# Patient Record
Sex: Female | Born: 1945 | Race: Black or African American | Hispanic: No | Marital: Single | Smoking: Never smoker
Health system: Southern US, Community
[De-identification: ages and names within clinical notes are randomized; demographics above are authoritative.]

## PROBLEM LIST (undated history)

## (undated) DIAGNOSIS — R030 Elevated blood-pressure reading, without diagnosis of hypertension: Secondary | ICD-10-CM

## (undated) DIAGNOSIS — R51 Headache: Secondary | ICD-10-CM

## (undated) DIAGNOSIS — Z8619 Personal history of other infectious and parasitic diseases: Secondary | ICD-10-CM

## (undated) DIAGNOSIS — R519 Headache, unspecified: Secondary | ICD-10-CM

## (undated) DIAGNOSIS — M1990 Unspecified osteoarthritis, unspecified site: Secondary | ICD-10-CM

## (undated) HISTORY — DX: Elevated blood-pressure reading, without diagnosis of hypertension: R03.0

## (undated) HISTORY — DX: Unspecified osteoarthritis, unspecified site: M19.90

## (undated) HISTORY — DX: Personal history of other infectious and parasitic diseases: Z86.19

## (undated) HISTORY — PX: KNEE SURGERY: SHX244

## (undated) HISTORY — DX: Headache: R51

## (undated) HISTORY — PX: OTHER SURGICAL HISTORY: SHX169

## (undated) HISTORY — PX: CATARACT EXTRACTION: SUR2

## (undated) HISTORY — DX: Headache, unspecified: R51.9

---

## 1993-10-14 HISTORY — PX: BREAST BIOPSY: SHX20

## 1993-10-14 HISTORY — PX: ABDOMINAL HYSTERECTOMY: SHX81

## 2014-10-28 ENCOUNTER — Ambulatory Visit (HOSPITAL_BASED_OUTPATIENT_CLINIC_OR_DEPARTMENT_OTHER)
Admission: RE | Admit: 2014-10-28 | Discharge: 2014-10-28 | Disposition: A | Discharge status: Home / Self Care | Payer: BLUE CROSS/BLUE SHIELD | Source: Ambulatory Visit | Attending: Physician Assistant | Admitting: Physician Assistant

## 2014-10-28 ENCOUNTER — Encounter: Payer: Self-pay | Admitting: Physician Assistant

## 2014-10-28 ENCOUNTER — Ambulatory Visit (INDEPENDENT_AMBULATORY_CARE_PROVIDER_SITE_OTHER): Payer: BLUE CROSS/BLUE SHIELD | Admitting: Physician Assistant

## 2014-10-28 VITALS — BP 138/74 | HR 68 | Temp 98.3°F | Resp 16 | Ht 63.25 in | Wt 161.4 lb

## 2014-10-28 DIAGNOSIS — M170 Bilateral primary osteoarthritis of knee: Secondary | ICD-10-CM | POA: Insufficient documentation

## 2014-10-28 DIAGNOSIS — Z23 Encounter for immunization: Secondary | ICD-10-CM

## 2014-10-28 DIAGNOSIS — M25561 Pain in right knee: Secondary | ICD-10-CM | POA: Diagnosis present

## 2014-10-28 DIAGNOSIS — Z0184 Encounter for antibody response examination: Secondary | ICD-10-CM

## 2014-10-28 DIAGNOSIS — Z0000 Encounter for general adult medical examination without abnormal findings: Secondary | ICD-10-CM

## 2014-10-28 DIAGNOSIS — Z13820 Encounter for screening for osteoporosis: Secondary | ICD-10-CM | POA: Insufficient documentation

## 2014-10-28 DIAGNOSIS — Z1211 Encounter for screening for malignant neoplasm of colon: Secondary | ICD-10-CM

## 2014-10-28 LAB — COMPREHENSIVE METABOLIC PANEL
ALT: 10 U/L (ref 0–35)
AST: 21 U/L (ref 0–37)
Albumin: 4.4 g/dL (ref 3.5–5.2)
Alkaline Phosphatase: 89 U/L (ref 39–117)
BUN: 8 mg/dL (ref 6–23)
CO2: 28 mEq/L (ref 19–32)
Calcium: 9.7 mg/dL (ref 8.4–10.5)
Chloride: 107 mEq/L (ref 96–112)
Creatinine, Ser: 0.57 mg/dL (ref 0.40–1.20)
GFR: 136.25 mL/min (ref >60.00)
Glucose, Bld: 87 mg/dL (ref 70–99)
Potassium: 3.7 mEq/L (ref 3.5–5.1)
Sodium: 140 mEq/L (ref 135–145)
Total Bilirubin: 0.4 mg/dL (ref 0.2–1.2)
Total Protein: 8.2 g/dL (ref 6.0–8.3)

## 2014-10-28 LAB — CBC
HCT: 40.7 % (ref 36.0–46.0)
Hemoglobin: 13.1 g/dL (ref 12.0–15.0)
MCHC: 32.1 g/dL (ref 30.0–36.0)
MCV: 89.5 fl (ref 78.0–100.0)
Platelets: 348 10*3/uL (ref 150.0–400.0)
RBC: 4.55 Mil/uL (ref 3.87–5.11)
RDW: 12.4 % (ref 11.5–15.5)
WBC: 8.6 10*3/uL (ref 4.0–10.5)

## 2014-10-28 LAB — LIPID PANEL
Cholesterol: 184 mg/dL (ref 0–200)
HDL: 54.1 mg/dL (ref >39.00)
LDL Cholesterol: 118 mg/dL — ABNORMAL HIGH (ref 0–99)
NonHDL: 129.9
Total CHOL/HDL Ratio: 3
Triglycerides: 58 mg/dL (ref 0.0–149.0)
VLDL: 11.6 mg/dL (ref 0.0–40.0)

## 2014-10-28 LAB — HEMOGLOBIN A1C: Hgb A1c MFr Bld: 5.9 % (ref 4.6–6.5)

## 2014-10-28 LAB — TSH: TSH: 1.62 u[IU]/mL (ref 0.35–4.50)

## 2014-10-28 MED ORDER — TRAMADOL HCL 50 MG PO TABS: 50.0000 mg | ORAL_TABLET | Freq: Three times a day (TID) | ORAL | Status: DC | PRN

## 2014-10-28 NOTE — Progress Notes (Signed)
Patient presents to clinic today to establish care.  Patient requesting CPE.  Is fasting for labs.  Patient with history of OA of knees bilaterally.  Endorses pain daily with some mild stiffness lasting < 1 hour.  Pain is worse at end of day.  Takes occasional Advil for pain with little relief.  Has not had imaging since 2008 when she had L knee arthroscopy.  Overdue for Bone Density to screen for osteoporosis and Colonoscopy to screen for colorectal cancer.  Past Medical History  Diagnosis Date  . Elevated blood pressure (not hypertension)   . 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 Frequent headaches     No current outpatient prescriptions on file prior to visit.   No current facility-administered medications on file prior to visit.    No Known Allergies  Family History  Problem Relation Age of Onset  . Breast cancer Mother 37    Deceased  . Heart disease Mother   . Hypertension Mother   . Hypertension Father     Deceased  . Stroke Father   . Hypertension Sister   . Hypertension Brother   . Heart defect Daughter     History   Social History  . Marital Status: Single    Spouse Name: N/A    Number of Children: N/A  . Years of Education: N/A   Social History Main Topics  . Smoking status: Never Smoker   . Smokeless tobacco: Never Used  . Alcohol Use: 0.0 oz/week    0 Not specified per week     Comment: rare  . Drug Use: No  . Sexual Activity: Not Currently   Other Topics Concern  . 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   Review of Systems - See HPI.  All other ROS are negative.  BP 138/74 mmHg  Pulse 68  Temp(Src) 98.3 F (36.8 C) (Oral)  Resp 16  Ht 5' 3.25" (1.607 m)  Wt 161 lb 6 oz (73.199 kg)  BMI 28.34 kg/m2  SpO2 100%  Physical Exam  Constitutional: She is oriented to person, place, and time and well-developed, well-nourished, and in no distress.  HENT:  Head:  Normocephalic and atraumatic.  Right Ear: External ear normal.  Left Ear: External ear normal.  Nose: Nose normal.  Mouth/Throat: Oropharynx is clear and moist. No oropharyngeal exudate.  TM within normal limits bilaterally.  Eyes: Conjunctivae are normal.  Neck: Neck supple. No thyromegaly present.  Cardiovascular: Normal rate, regular rhythm, normal heart sounds and intact distal pulses.   Pulmonary/Chest: Effort normal and breath sounds normal. No respiratory distress. She has no wheezes. She has no rales. She exhibits no tenderness.  Abdominal: Soft. Bowel sounds are normal. She exhibits no distension and no mass. There is no tenderness. There is no rebound and no guarding.  Musculoskeletal:       Right knee: She exhibits decreased range of motion. She exhibits no swelling. Tenderness found.       Left knee: She exhibits decreased range of motion. She exhibits no swelling. Tenderness found.  Lymphadenopathy:    She has no cervical adenopathy.  Neurological: She is alert and oriented to person, place, and time.  Skin: Skin is warm and dry. No rash noted.  Psychiatric: Affect normal.  Vitals reviewed.  Assessment/Plan: Visit for preventive health examination I have reviewed the patient's medical history in detail and updated the computerized patient  record. Prevnar given by nursing staff. Order placed for colonoscopy and bone density scan.  Endorses mammogram up-to-date.  Will obtain fasting labs today.  Preventive care reviewed.  Handout given.   Immunity status testing Will check Varicella immunity status before proceeding with Zostavax.   Primary osteoarthritis of both knees Will obtain DG knees bilaterally to assess severity of OA. Rx Tramadol for severe pain.  Apply topical Aspercreme to knees each morning.  Wear supportive footwear.  Follow-up 1 month.   Colon cancer screening Referral placed to GI for screening colonoscopy in an average-risk patient.   Osteoporosis  screening Order for Bone Density placed.  Resistance training reviewed with patient.  Also recommended Calc-Vit D supplementation.  Patient will give some thought.

## 2014-10-28 NOTE — Progress Notes (Signed)
Pre visit review using our clinic review tool, if applicable. No additional management support is needed unless otherwise documented below in the visit note/SLS  

## 2014-10-28 NOTE — Patient Instructions (Signed)
Please go to the lab for blood work.  I will call you with your results.  Go downstairs for your x-ray.  I will call you with these results as well. 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  Use Tramadol sparingly if needed for severe pain.  Apply topical Aspercreme to the knees each day before working.  You will be contacted by GI for a colonoscopy and by our Imaging department for a Osteoporosis screening.   Welcome to [facility]!  Follow-up in 1 month.  Preventive Care for Adults A healthy lifestyle and preventive care can promote health and wellness. Preventive health guidelines for women include the following key practices.  A routine yearly physical is a good way to check with your health care provider about your health and preventive screening. It is a chance to share any concerns and updates on your health and to receive a thorough exam.  Visit your dentist for a routine exam and preventive care every 6 months. Brush your teeth twice a day and floss once a day. Good oral hygiene prevents tooth decay and gum disease.  The frequency of eye exams is based on your age, health, family medical history, use of contact lenses, and other factors. Follow your health care provider's recommendations for frequency of eye exams.  Eat a healthy diet. Foods like vegetables, fruits, whole grains, low-fat dairy products, and lean protein foods contain the nutrients you need without too many calories. Decrease your intake of foods high in solid fats, added sugars, and salt. Eat the right amount of calories for you.Get information about a proper diet from your health care provider, if necessary.  Regular physical exercise is one of the most important things you can do for your health. Most adults should get at least 150 minutes of moderate-intensity exercise (any activity that increases your heart rate and causes you to sweat) each week. In addition, most adults need muscle-strengthening exercises on 2 or  more days a week.  Maintain a healthy weight. The body mass index (BMI) is a screening tool to identify possible weight problems. It provides an estimate of body fat based on height and weight. Your health care provider can find your BMI and can help you achieve or maintain a healthy weight.For adults 20 years and older:  A BMI below 18.5 is considered underweight.  A BMI of 18.5 to 24.9 is normal.  A BMI of 25 to 29.9 is considered overweight.  A BMI of 30 and above is considered obese.  Maintain normal blood lipids and cholesterol levels by exercising and minimizing your intake of saturated fat. Eat a balanced diet with plenty of fruit and vegetables. Blood tests for lipids and cholesterol should begin at age 7 and be repeated every 5 years. If your lipid or cholesterol levels are high, you are over 50, or you are at high risk for heart disease, you may need your cholesterol levels checked more frequently.Ongoing high lipid and cholesterol levels should be treated with medicines if diet and exercise are not working.  If you smoke, find out from your health care provider how to quit. If you do not use tobacco, do not start.  Lung cancer screening is recommended for adults aged 41-80 years who are at high risk for developing lung cancer because of a history of smoking. A yearly low-dose CT scan of the lungs is recommended for people who have at least a 30-pack-year history of smoking and are a current smoker or have quit within  the past 15 years. A pack year of smoking is smoking an average of 1 pack of cigarettes a day for 1 year (for example: 1 pack a day for 30 years or 2 packs a day for 15 years). Yearly screening should continue until the smoker has stopped smoking for at least 15 years. Yearly screening should be stopped for people who develop a health problem that would prevent them from having lung cancer treatment.  If you are pregnant, do not drink alcohol. If you are breastfeeding, be  very cautious about drinking alcohol. If you are not pregnant and choose to drink alcohol, do not have more than 1 drink per day. One drink is considered to be 12 ounces (355 mL) of beer, 5 ounces (148 mL) of wine, or 1.5 ounces (44 mL) of liquor.  Avoid use of street drugs. Do not share needles with anyone. Ask for help if you need support or instructions about stopping the use of drugs.  High blood pressure causes heart disease and increases the risk of stroke. Your blood pressure should be checked at least every 1 to 2 years. Ongoing high blood pressure should be treated with medicines if weight loss and exercise do not work.  If you are 16-59 years old, ask your health care provider if you should take aspirin to prevent strokes.  Diabetes screening involves taking a blood sample to check your fasting blood sugar level. This should be done once every 3 years, after age 68, if you are within normal weight and without risk factors for diabetes. Testing should be considered at a younger age or be carried out more frequently if you are overweight and have at least 1 risk factor for diabetes.  Breast cancer screening is essential preventive care for women. You should practice "breast self-awareness." This means understanding the normal appearance and feel of your breasts and may include breast self-examination. Any changes detected, no matter how small, should be reported to a health care provider. Women in their 24s and 30s should have a clinical breast exam (CBE) by a health care provider as part of a regular health exam every 1 to 3 years. After age 14, women should have a CBE every year. Starting at age 59, women should consider having a mammogram (breast X-ray test) every year. Women who have a family history of breast cancer should talk to their health care provider about genetic screening. Women at a high risk of breast cancer should talk to their health care providers about having an MRI and a  mammogram every year.  Breast cancer gene (BRCA)-related cancer risk assessment is recommended for women who have family members with BRCA-related cancers. BRCA-related cancers include breast, ovarian, tubal, and peritoneal cancers. Having family members with these cancers may be associated with an increased risk for harmful changes (mutations) in the breast cancer genes BRCA1 and BRCA2. Results of the assessment will determine the need for genetic counseling and BRCA1 and BRCA2 testing.  Routine pelvic exams to screen for cancer are no longer recommended for nonpregnant women who are considered low risk for cancer of the pelvic organs (ovaries, uterus, and vagina) and who do not have symptoms. Ask your health care provider if a screening pelvic exam is right for you.  If you have had past treatment for cervical cancer or a condition that could lead to cancer, you need Pap tests and screening for cancer for at least 20 years after your treatment. If Pap tests have been discontinued, your  risk factors (such as having a new sexual partner) need to be reassessed to determine if screening should be resumed. Some women have medical problems that increase the chance of getting cervical cancer. In these cases, your health care provider may recommend more frequent screening and Pap tests.  The HPV test is an additional test that may be used for cervical cancer screening. The HPV test looks for the virus that can cause the cell changes on the cervix. The cells collected during the Pap test can be tested for HPV. The HPV test could be used to screen women aged 72 years and older, and should be used in women of any age who have unclear Pap test results. After the age of 6, women should have HPV testing at the same frequency as a Pap test.  Colorectal cancer can be detected and often prevented. Most routine colorectal cancer screening begins at the age of 55 years and continues through age 31 years. However, your  health care provider may recommend screening at an earlier age if you have risk factors for colon cancer. On a yearly basis, your health care provider may provide home test kits to check for hidden blood in the stool. Use of a small camera at the end of a tube, to directly examine the colon (sigmoidoscopy or colonoscopy), can detect the earliest forms of colorectal cancer. Talk to your health care provider about this at age 40, when routine screening begins. Direct exam of the colon should be repeated every 5-10 years through age 33 years, unless early forms of pre-cancerous polyps or small growths are found.  People who are at an increased risk for hepatitis B should be screened for this virus. You are considered at high risk for hepatitis B if:  You were born in a country where hepatitis B occurs often. Talk with your health care provider about which countries are considered high risk.  Your parents were born in a high-risk country and you have not received a shot to protect against hepatitis B (hepatitis B vaccine).  You have HIV or AIDS.  You use needles to inject street drugs.  You live with, or have sex with, someone who has hepatitis B.  You get hemodialysis treatment.  You take certain medicines for conditions like cancer, organ transplantation, and autoimmune conditions.  Hepatitis C blood testing is recommended for all people born from [birth year] through 1965 and any individual with known risks for hepatitis C.  Practice safe sex. Use condoms and avoid high-risk sexual practices to reduce the spread of sexually transmitted infections (STIs). STIs include gonorrhea, chlamydia, syphilis, trichomonas, herpes, HPV, and human immunodeficiency virus (HIV). Herpes, HIV, and HPV are viral illnesses that have no cure. They can result in disability, cancer, and death.  You should be screened for sexually transmitted illnesses (STIs) including gonorrhea and chlamydia if:  You are sexually active  and are younger than 24 years.  You are older than 24 years and your health care provider tells you that you are at risk for this type of infection.  Your sexual activity has changed since you were last screened and you are at an increased risk for chlamydia or gonorrhea. Ask your health care provider if you are at risk.  If you are at risk of being infected with HIV, it is recommended that you take a prescription medicine daily to prevent HIV infection. This is called preexposure prophylaxis (PrEP). You are considered at risk if:  You are a heterosexual woman,  are sexually active, and are at increased risk for HIV infection.  You take drugs by injection.  You are sexually active with a partner who has HIV.  Talk with your health care provider about whether you are at high risk of being infected with HIV. If you choose to begin PrEP, you should first be tested for HIV. You should then be tested every 3 months for as long as you are taking PrEP.  Osteoporosis is a disease in which the bones lose minerals and strength with aging. This can result in serious bone fractures or breaks. The risk of osteoporosis can be identified using a bone density scan. Women ages 84 years and over and women at risk for fractures or osteoporosis should discuss screening with their health care providers. Ask your health care provider whether you should take a calcium supplement or vitamin D to reduce the rate of osteoporosis.  Menopause can be associated with physical symptoms and risks. Hormone replacement therapy is available to decrease symptoms and risks. You should talk to your health care provider about whether hormone replacement therapy is right for you.  Use sunscreen. Apply sunscreen liberally and repeatedly throughout the day. You should seek shade when your shadow is shorter than you. Protect yourself by wearing long sleeves, pants, a wide-brimmed hat, and sunglasses year round, whenever you are  outdoors.  Once a month, do a whole body skin exam, using a mirror to look at the skin on your back. Tell your health care provider of new moles, moles that have irregular borders, moles that are larger than a pencil eraser, or moles that have changed in shape or color.  Stay current with required vaccines (immunizations).  Influenza vaccine. All adults should be immunized every year.  Tetanus, diphtheria, and acellular pertussis (Td, Tdap) vaccine. Pregnant women should receive 1 dose of Tdap vaccine during each pregnancy. The dose should be obtained regardless of the length of time since the last dose. Immunization is preferred during the 27th-36th week of gestation. An adult who has not previously received Tdap or who does not know her vaccine status should receive 1 dose of Tdap. This initial dose should be followed by tetanus and diphtheria toxoids (Td) booster doses every 10 years. Adults with an unknown or incomplete history of completing a 3-dose immunization series with Td-containing vaccines should begin or complete a primary immunization series including a Tdap dose. Adults should receive a Td booster every 10 years.  Varicella vaccine. An adult without evidence of immunity to varicella should receive 2 doses or a second dose if she has previously received 1 dose. Pregnant females who do not have evidence of immunity should receive the first dose after pregnancy. This first dose should be obtained before leaving the health care facility. The second dose should be obtained 4-8 weeks after the first dose.  Human papillomavirus (HPV) vaccine. Females aged 13-26 years who have not received the vaccine previously should obtain the 3-dose series. The vaccine is not recommended for use in pregnant females. However, pregnancy testing is not needed before receiving a dose. If a female is found to be pregnant after receiving a dose, no treatment is needed. In that case, the remaining doses should be  delayed until after the pregnancy. Immunization is recommended for any person with an immunocompromised condition through the age of 79 years if she did not get any or all doses earlier. During the 3-dose series, the second dose should be obtained 4-8 weeks after the first  dose. The third dose should be obtained 24 weeks after the first dose and 16 weeks after the second dose.  Zoster vaccine. One dose is recommended for adults aged 51 years or older unless certain conditions are present.  Measles, mumps, and rubella (MMR) vaccine. Adults born before [birth year] generally are considered immune to measles and mumps. Adults born in [birth year] or later should have 1 or more doses of MMR vaccine unless there is a contraindication to the vaccine or there is laboratory evidence of immunity to each of the three diseases. A routine second dose of MMR vaccine should be obtained at least 28 days after the first dose for students attending postsecondary schools, health care workers, or international travelers. People who received inactivated measles vaccine or an unknown type of measles vaccine during 1963-1967 should receive 2 doses of MMR vaccine. People who received inactivated mumps vaccine or an unknown type of mumps vaccine before 1979 and are at high risk for mumps infection should consider immunization with 2 doses of MMR vaccine. For females of childbearing age, rubella immunity should be determined. If there is no evidence of immunity, females who are not pregnant should be vaccinated. If there is no evidence of immunity, females who are pregnant should delay immunization until after pregnancy. Unvaccinated health care workers born before [birth year] who lack laboratory evidence of measles, mumps, or rubella immunity or laboratory confirmation of disease should consider measles and mumps immunization with 2 doses of MMR vaccine or rubella immunization with 1 dose of MMR vaccine.  Pneumococcal 13-valent conjugate (PCV13) vaccine.  When indicated, a person who is uncertain of her immunization history and has no record of immunization should receive the PCV13 vaccine. An adult aged 30 years or older who has certain medical conditions and has not been previously immunized should receive 1 dose of PCV13 vaccine. This PCV13 should be followed with a dose of pneumococcal polysaccharide (PPSV23) vaccine. The PPSV23 vaccine dose should be obtained at least 8 weeks after the dose of PCV13 vaccine. An adult aged 67 years or older who has certain medical conditions and previously received 1 or more doses of PPSV23 vaccine should receive 1 dose of PCV13. The PCV13 vaccine dose should be obtained 1 or more years after the last PPSV23 vaccine dose.  Pneumococcal polysaccharide (PPSV23) vaccine. When PCV13 is also indicated, PCV13 should be obtained first. All adults aged 53 years and older should be immunized. An adult younger than age 47 years who has certain medical conditions should be immunized. Any person who resides in a nursing home or long-term care facility should be immunized. An adult smoker should be immunized. People with an immunocompromised condition and certain other conditions should receive both PCV13 and PPSV23 vaccines. People with human immunodeficiency virus (HIV) infection should be immunized as soon as possible after diagnosis. Immunization during chemotherapy or radiation therapy should be avoided. Routine use of PPSV23 vaccine is not recommended for American Indians, [place]atives, or people younger than 65 years unless there are medical conditions that require PPSV23 vaccine. When indicated, people who have unknown immunization and have no record of immunization should receive PPSV23 vaccine. One-time revaccination 5 years after the first dose of PPSV23 is recommended for people aged 19-64 years who have chronic kidney failure, nephrotic syndrome, asplenia, or immunocompromised conditions. People who received 1-2 doses of  PPSV23 before age 25 years should receive another dose of PPSV23 vaccine at age 27 years or later if at least 5 years have passed since the  previous dose. Doses of PPSV23 are not needed for people immunized with PPSV23 at or after age 61 years.  Meningococcal vaccine. Adults with asplenia or persistent complement component deficiencies should receive 2 doses of quadrivalent meningococcal conjugate (MenACWY-D) vaccine. The doses should be obtained at least 2 months apart. Microbiologists working with certain meningococcal bacteria, [employer] recruits, people at risk during an outbreak, and people who travel to or live in countries with a high rate of meningitis should be immunized. A first-year college student up through age 81 years who is living in a residence hall should receive a dose if she did not receive a dose on or after her 16th birthday. Adults who have certain high-risk conditions should receive one or more doses of vaccine.  Hepatitis A vaccine. Adults who wish to be protected from this disease, have certain high-risk conditions, work with hepatitis A-infected animals, work in hepatitis A research labs, or travel to or work in countries with a high rate of hepatitis A should be immunized. Adults who were previously unvaccinated and who anticipate close contact with an international adoptee during the first 60 days after arrival in the [place] from a country with a high rate of hepatitis A should be immunized.  Hepatitis B vaccine. Adults who wish to be protected from this disease, have certain high-risk conditions, may be exposed to blood or other infectious body fluids, are household contacts or sex partners of hepatitis B positive people, are clients or workers in certain care facilities, or travel to or work in countries with a high rate of hepatitis B should be immunized.  Haemophilus influenzae type b (Hib) vaccine. A previously unvaccinated person with asplenia or sickle cell disease  or having a scheduled splenectomy should receive 1 dose of Hib vaccine. Regardless of previous immunization, a recipient of a hematopoietic stem cell transplant should receive a 3-dose series 6-12 months after her successful transplant. Hib vaccine is not recommended for adults with HIV infection. Preventive Services / Frequency Ages 52 to 31 years  Blood pressure check.** / Every 1 to 2 years.  Lipid and cholesterol check.** / Every 5 years beginning at age 85.  Clinical breast exam.** / Every 3 years for women in their 64s and 48s.  BRCA-related cancer risk assessment.** / For women who have family members with a BRCA-related cancer (breast, ovarian, tubal, or peritoneal cancers).  Pap test.** / Every 2 years from ages 64 through 30. Every 3 years starting at age 39 through age 13 or 105 with a history of 3 consecutive normal Pap tests.  HPV screening.** / Every 3 years from ages 21 through ages 63 to 67 with a history of 3 consecutive normal Pap tests.  Hepatitis C blood test.** / For any individual with known risks for hepatitis C.  Skin self-exam. / Monthly.  Influenza vaccine. / Every year.  Tetanus, diphtheria, and acellular pertussis (Tdap, Td) vaccine.** / Consult your health care provider. Pregnant women should receive 1 dose of Tdap vaccine during each pregnancy. 1 dose of Td every 10 years.  Varicella vaccine.** / Consult your health care provider. Pregnant females who do not have evidence of immunity should receive the first dose after pregnancy.  HPV vaccine. / 3 doses over 6 months, if 2 and younger. The vaccine is not recommended for use in pregnant females. However, pregnancy testing is not needed before receiving a dose.  Measles, mumps, rubella (MMR) vaccine.** / You need at least 1 dose of MMR if you were  born in [birth year] or later. You may also need a 2nd dose. For females of childbearing age, rubella immunity should be determined. If there is no evidence of immunity,  females who are not pregnant should be vaccinated. If there is no evidence of immunity, females who are pregnant should delay immunization until after pregnancy.  Pneumococcal 13-valent conjugate (PCV13) vaccine.** / Consult your health care provider.  Pneumococcal polysaccharide (PPSV23) vaccine.** / 1 to 2 doses if you smoke cigarettes or if you have certain conditions.  Meningococcal vaccine.** / 1 dose if you are age 47 to 30 years and a Market researcher living in a residence hall, or have one of several medical conditions, you need to get vaccinated against meningococcal disease. You may also need additional booster doses.  Hepatitis A vaccine.** / Consult your health care provider.  Hepatitis B vaccine.** / Consult your health care provider.  Haemophilus influenzae type b (Hib) vaccine.** / Consult your health care provider. Ages 88 to 70 years  Blood pressure check.** / Every 1 to 2 years.  Lipid and cholesterol check.** / Every 5 years beginning at age 12 years.  Lung cancer screening. / Every year if you are aged 56-80 years and have a 30-pack-year history of smoking and currently smoke or have quit within the past 15 years. Yearly screening is stopped once you have quit smoking for at least 15 years or develop a health problem that would prevent you from having lung cancer treatment.  Clinical breast exam.** / Every year after age 78 years.  BRCA-related cancer risk assessment.** / For women who have family members with a BRCA-related cancer (breast, ovarian, tubal, or peritoneal cancers).  Mammogram.** / Every year beginning at age 61 years and continuing for as long as you are in good health. Consult with your health care provider.  Pap test.** / Every 3 years starting at age 75 years through age 27 or 57 years with a history of 3 consecutive normal Pap tests.  HPV screening.** / Every 3 years from ages 30 years through ages 65 to 1 years with a history of 3  consecutive normal Pap tests.  Fecal occult blood test (FOBT) of stool. / Every year beginning at age 90 years and continuing until age 71 years. You may not need to do this test if you get a colonoscopy every 10 years.  Flexible sigmoidoscopy or colonoscopy.** / Every 5 years for a flexible sigmoidoscopy or every 10 years for a colonoscopy beginning at age 32 years and continuing until age 12 years.  Hepatitis C blood test.** / For all people born from [birth year] through 1963 and any individual with known risks for hepatitis C.  Skin self-exam. / Monthly.  Influenza vaccine. / Every year.  Tetanus, diphtheria, and acellular pertussis (Tdap/Td) vaccine.** / Consult your health care provider. Pregnant women should receive 1 dose of Tdap vaccine during each pregnancy. 1 dose of Td every 10 years.  Varicella vaccine.** / Consult your health care provider. Pregnant females who do not have evidence of immunity should receive the first dose after pregnancy.  Zoster vaccine.** / 1 dose for adults aged 34 years or older.  Measles, mumps, rubella (MMR) vaccine.** / You need at least 1 dose of MMR if you were born in 1957 or later. You may also need a 2nd dose. For females of childbearing age, rubella immunity should be determined. If there is no evidence of immunity, females who are not pregnant should be vaccinated. If there is  no evidence of immunity, females who are pregnant should delay immunization until after pregnancy.  Pneumococcal 13-valent conjugate (PCV13) vaccine.** / Consult your health care provider.  Pneumococcal polysaccharide (PPSV23) vaccine.** / 1 to 2 doses if you smoke cigarettes or if you have certain conditions.  Meningococcal vaccine.** / Consult your health care provider.  Hepatitis A vaccine.** / Consult your health care provider.  Hepatitis B vaccine.** / Consult your health care provider.  Haemophilus influenzae type b (Hib) vaccine.** / Consult your health care  provider. Ages 36 years and over  Blood pressure check.** / Every 1 to 2 years.  Lipid and cholesterol check.** / Every 5 years beginning at age 43 years.  Lung cancer screening. / Every year if you are aged 90-80 years and have a 30-pack-year history of smoking and currently smoke or have quit within the past 15 years. Yearly screening is stopped once you have quit smoking for at least 15 years or develop a health problem that would prevent you from having lung cancer treatment.  Clinical breast exam.** / Every year after age 97 years.  BRCA-related cancer risk assessment.** / For women who have family members with a BRCA-related cancer (breast, ovarian, tubal, or peritoneal cancers).  Mammogram.** / Every year beginning at age 79 years and continuing for as long as you are in good health. Consult with your health care provider.  Pap test.** / Every 3 years starting at age 66 years through age 70 or 40 years with 3 consecutive normal Pap tests. Testing can be stopped between 65 and 70 years with 3 consecutive normal Pap tests and no abnormal Pap or HPV tests in the past 10 years.  HPV screening.** / Every 3 years from ages 56 years through ages 84 or 48 years with a history of 3 consecutive normal Pap tests. Testing can be stopped between 65 and 70 years with 3 consecutive normal Pap tests and no abnormal Pap or HPV tests in the past 10 years.  Fecal occult blood test (FOBT) of stool. / Every year beginning at age 62 years and continuing until age 46 years. You may not need to do this test if you get a colonoscopy every 10 years.  Flexible sigmoidoscopy or colonoscopy.** / Every 5 years for a flexible sigmoidoscopy or every 10 years for a colonoscopy beginning at age 25 years and continuing until age 74 years.  Hepatitis C blood test.** / For all people born from [birth year] through 1963 and any individual with known risks for hepatitis C.  Osteoporosis screening.** / A one-time screening for  women ages 75 years and over and women at risk for fractures or osteoporosis.  Skin self-exam. / Monthly.  Influenza vaccine. / Every year.  Tetanus, diphtheria, and acellular pertussis (Tdap/Td) vaccine.** / 1 dose of Td every 10 years.  Varicella vaccine.** / Consult your health care provider.  Zoster vaccine.** / 1 dose for adults aged 49 years or older.  Pneumococcal 13-valent conjugate (PCV13) vaccine.** / Consult your health care provider.  Pneumococcal polysaccharide (PPSV23) vaccine.** / 1 dose for all adults aged 62 years and older.  Meningococcal vaccine.** / Consult your health care provider.  Hepatitis A vaccine.** / Consult your health care provider.  Hepatitis B vaccine.** / Consult your health care provider.  Haemophilus influenzae type b (Hib) vaccine.** / Consult your health care provider. ** Family history and personal history of risk and conditions may change your health care provider's recommendations. Document Released: 11/26/2001 Document Revised: 02/14/2014 Document Reviewed:  02/25/2011 ExitCare Patient Information 2015 [place], [place]. This information is not intended to replace advice given to you by your health care provider. Make sure you discuss any questions you have with your health care provider.

## 2014-10-29 LAB — URINALYSIS, ROUTINE W REFLEX MICROSCOPIC
Bilirubin Urine: NEGATIVE
Glucose, UA: NEGATIVE mg/dL
Hgb urine dipstick: NEGATIVE
Ketones, ur: NEGATIVE mg/dL
Leukocytes, UA: NEGATIVE
Nitrite: NEGATIVE
Protein, ur: NEGATIVE mg/dL
Specific Gravity, Urine: 1.025 (ref 1.005–1.030)
Urobilinogen, UA: 0.2 mg/dL (ref 0.0–1.0)
pH: 5.5 (ref 5.0–8.0)

## 2014-10-30 NOTE — Assessment & Plan Note (Signed)
I have reviewed the patient's medical history in detail and updated the computerized patient record. Prevnar given by nursing staff. Order placed for colonoscopy and bone density scan.  Endorses mammogram up-to-date.  Will obtain fasting labs today.  Preventive care reviewed.  Handout given.

## 2014-10-30 NOTE — Assessment & Plan Note (Signed)
Will obtain DG knees bilaterally to assess severity of OA. Rx Tramadol for severe pain.  Apply topical Aspercreme to knees each morning.  Wear supportive footwear.  Follow-up 1 month.

## 2014-10-30 NOTE — Assessment & Plan Note (Signed)
Order for Bone Density placed.  Resistance training reviewed with patient.  Also recommended Calc-Vit D supplementation.  Patient will give some thought.

## 2014-10-30 NOTE — Assessment & Plan Note (Signed)
Referral placed to GI for screening colonoscopy in an average-risk patient.

## 2014-10-30 NOTE — Assessment & Plan Note (Signed)
Will check Varicella immunity status before proceeding with Zostavax.

## 2014-10-31 LAB — VARICELLA ZOSTER ANTIBODY, IGG: Varicella IgG: 603.8 Index — ABNORMAL HIGH (ref <135.00)

## 2014-11-01 ENCOUNTER — Telehealth: Payer: Self-pay | Admitting: Physician Assistant

## 2014-11-01 NOTE — Telephone Encounter (Signed)
Pt scheduled bone density appointment for 11/09/2014

## 2014-11-01 NOTE — Telephone Encounter (Signed)
Noted  

## 2014-11-09 ENCOUNTER — Ambulatory Visit (INDEPENDENT_AMBULATORY_CARE_PROVIDER_SITE_OTHER)
Admission: RE | Admit: 2014-11-09 | Discharge: 2014-11-09 | Disposition: A | Discharge status: Home / Self Care | Payer: BLUE CROSS/BLUE SHIELD | Source: Ambulatory Visit | Attending: Physician Assistant | Admitting: Physician Assistant

## 2014-11-09 ENCOUNTER — Inpatient Hospital Stay (HOSPITAL_COMMUNITY): Admission: RE | Admit: 2014-11-09 | Payer: BLUE CROSS/BLUE SHIELD | Source: Ambulatory Visit

## 2014-11-09 DIAGNOSIS — Z13820 Encounter for screening for osteoporosis: Secondary | ICD-10-CM

## 2014-11-10 ENCOUNTER — Other Ambulatory Visit: Payer: BLUE CROSS/BLUE SHIELD

## 2014-11-14 ENCOUNTER — Encounter: Payer: Self-pay | Admitting: *Deleted

## 2015-03-20 ENCOUNTER — Telehealth: Payer: Self-pay | Admitting: Physician Assistant

## 2015-03-20 NOTE — Telephone Encounter (Signed)
Caller name: [employer]  Call back number: [phone]   Reason for call:  [employer] called in regards to pt receiving shingles vaccination and wanted to ensure pt would be receiving injection at tomorrow appointment 03/21/15

## 2015-03-21 ENCOUNTER — Encounter: Payer: Self-pay | Admitting: Physician Assistant

## 2015-03-21 ENCOUNTER — Ambulatory Visit (INDEPENDENT_AMBULATORY_CARE_PROVIDER_SITE_OTHER): Payer: BLUE CROSS/BLUE SHIELD | Admitting: Physician Assistant

## 2015-03-21 VITALS — BP 130/83 | HR 69 | Temp 97.6°F | Resp 16 | Ht 63.25 in | Wt 159.2 lb

## 2015-03-21 DIAGNOSIS — Z23 Encounter for immunization: Secondary | ICD-10-CM

## 2015-03-21 DIAGNOSIS — M170 Bilateral primary osteoarthritis of knee: Secondary | ICD-10-CM

## 2015-03-21 DIAGNOSIS — R5383 Other fatigue: Secondary | ICD-10-CM | POA: Diagnosis not present

## 2015-03-21 DIAGNOSIS — Z1211 Encounter for screening for malignant neoplasm of colon: Secondary | ICD-10-CM

## 2015-03-21 LAB — CBC
HCT: 41.5 % (ref 36.0–46.0)
Hemoglobin: 13.7 g/dL (ref 12.0–15.0)
MCHC: 33 g/dL (ref 30.0–36.0)
MCV: 87.6 fl (ref 78.0–100.0)
Platelets: 381 10*3/uL (ref 150.0–400.0)
RBC: 4.74 Mil/uL (ref 3.87–5.11)
RDW: 12.1 % (ref 11.5–15.5)
WBC: 8.4 10*3/uL (ref 4.0–10.5)

## 2015-03-21 LAB — VITAMIN D 25 HYDROXY (VIT D DEFICIENCY, FRACTURES): VITD: 22.18 ng/mL — ABNORMAL LOW (ref 30.00–100.00)

## 2015-03-21 LAB — VITAMIN B12: Vitamin B-12: 598 pg/mL (ref 211–911)

## 2015-03-21 NOTE — Progress Notes (Signed)
   Patient presents to clinic today c/o fatigue over the past few months, mainly in the evenings despite restful sleep each night.  Is working 2 part-time jobs presently.  Denies depressed mood or anhedonia.  Recent TSH unremarkable.  Past Medical History  Diagnosis Date  . Elevated blood pressure (not hypertension)   . 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 Frequent headaches     Current Outpatient Prescriptions on File Prior to Visit  Medication Sig Dispense Refill  . Multiple Vitamins-Minerals (MULTIVITAMIN ADULTS 50+) TABS Take by mouth daily.     No current facility-administered medications on file prior to visit.    Allergies  Allergen Reactions  . Tramadol Nausea Only and Other (See Comments)    Dizziness    Family History  Problem Relation Age of Onset  . Breast cancer Mother 20    Deceased  . Heart disease Mother   . Hypertension Mother   . Hypertension Father     Deceased  . Stroke Father   . Hypertension Sister   . Hypertension Brother   . Heart defect Daughter     History   Social History  . Marital Status: Single    Spouse Name: N/A  . Number of Children: N/A  . Years of Education: N/A   Social History Main Topics  . Smoking status: Never Smoker   . Smokeless tobacco: Never Used  . Alcohol Use: 0.0 oz/week    0 Standard drinks or equivalent per week     Comment: rare  . Drug Use: No  . Sexual Activity: Not Currently   Other Topics Concern  . 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    Review of Systems - See HPI.  All other ROS are negative.  BP 130/83 mmHg  Pulse 69  Temp(Src) 97.6 F (36.4 C) (Oral)  Resp 16  Ht 5' 3.25" (1.607 m)  Wt 159 lb 4 oz (72.235 kg)  BMI 27.97 kg/m2  SpO2 98%  Physical Exam  Constitutional: She is oriented to person, place, and time and well-developed, well-nourished, and in no distress.  HENT:  Head: Normocephalic and atraumatic.  Eyes:  Conjunctivae are normal.  Neck: Neck supple. No thyromegaly present.  Cardiovascular: Normal rate, regular rhythm, normal heart sounds and intact distal pulses.   Pulmonary/Chest: Effort normal and breath sounds normal. No respiratory distress. She has no wheezes. She has no rales. She exhibits no tenderness.  Neurological: She is alert and oriented to person, place, and time.  Skin: Skin is warm and dry. No rash noted.  Psychiatric: Affect normal.  Vitals reviewed.  Assessment/Plan: Colon cancer screening Referral placed to [facility] GI provider for screening colonoscopy.   Primary osteoarthritis of both knees Referral placed to Orthopedics for further management of this chronic condition.   Need for shingles vaccine Zostavax given by nursing staff.   Other fatigue Mainly afternoon. Will check CBC, B12 and Vitamin D.  Encouraged increase exercise.

## 2015-03-21 NOTE — Assessment & Plan Note (Signed)
Zostavax given by nursing staff.

## 2015-03-21 NOTE — Telephone Encounter (Signed)
Pt was seen today by provider and shingles vaccination was given.//AB/CMA

## 2015-03-21 NOTE — Assessment & Plan Note (Signed)
Referral placed to Orthopedics for further management of this chronic condition.

## 2015-03-21 NOTE — Progress Notes (Signed)
Pre visit review using our clinic review tool, if applicable. No additional management support is needed unless otherwise documented below in the visit note/SLS  

## 2015-03-21 NOTE — Assessment & Plan Note (Signed)
Mainly afternoon. Will check CBC, B12 and Vitamin D.  Encouraged increase exercise.

## 2015-03-21 NOTE — Patient Instructions (Signed)
Please go to the lab for blood work. I will call you with your results. Stay well hydrated and eat a well-balanced diet.  You will be contacted by Gastroenterology for a colonoscopy. You will be contacted by Orthopedics for evaluation of your knees.

## 2015-03-21 NOTE — Assessment & Plan Note (Signed)
Referral placed to [facility] provider for screening colonoscopy.

## 2015-03-24 ENCOUNTER — Telehealth: Payer: Self-pay | Admitting: Physician Assistant

## 2015-03-24 NOTE — Telephone Encounter (Signed)
Relation to pt: self  Call back number: [phone]   Reason for call:  Pt returning your call regarding lab results

## 2015-03-24 NOTE — Telephone Encounter (Signed)
SEE Result note. 

## 2015-04-03 ENCOUNTER — Encounter: Payer: Self-pay | Admitting: Physician Assistant

## 2015-04-03 ENCOUNTER — Ambulatory Visit (INDEPENDENT_AMBULATORY_CARE_PROVIDER_SITE_OTHER): Payer: BLUE CROSS/BLUE SHIELD | Admitting: Physician Assistant

## 2015-04-03 VITALS — BP 145/90 | HR 79 | Temp 97.7°F | Resp 16 | Ht 63.25 in | Wt 155.4 lb

## 2015-04-03 DIAGNOSIS — R42 Dizziness and giddiness: Secondary | ICD-10-CM | POA: Insufficient documentation

## 2015-04-03 LAB — POCT URINALYSIS DIPSTICK
Bilirubin, UA: NEGATIVE
Blood, UA: NEGATIVE
Glucose, UA: NEGATIVE
Ketones, UA: NEGATIVE
Leukocytes, UA: NEGATIVE
Nitrite, UA: NEGATIVE
Protein, UA: NEGATIVE
Spec Grav, UA: >=1.03
Urobilinogen, UA: 0.2
pH, UA: 5.5

## 2015-04-03 MED ORDER — FLUTICASONE PROPIONATE 50 MCG/ACT NA SUSP: 2.0000 | Freq: Every day | NASAL | Status: DC

## 2015-04-03 NOTE — Progress Notes (Signed)
Patient presents to clinic today headache starting yesterday afternoon associated with mild lightheadedness, nausea and one episode of vomiting.  Denies recurrence of nausea or vomiting. Denies recurrence of headaches.  Denies fever, chills, aches. Denies recent travel. Denies GI symptoms.  Denies sick contact.  Past Medical History  Diagnosis Date  . Elevated blood pressure (not hypertension)   . 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 Frequent headaches     Current Outpatient Prescriptions on File Prior to Visit  Medication Sig Dispense Refill  . Multiple Vitamins-Minerals (MULTIVITAMIN ADULTS 50+) TABS Take by mouth daily.    . naproxen sodium (ANAPROX) 220 MG tablet Take 220 mg by mouth daily as needed.     No current facility-administered medications on file prior to visit.    Allergies  Allergen Reactions  . Tramadol Nausea Only and Other (See Comments)    Dizziness    Family History  Problem Relation Age of Onset  . Breast cancer Mother 54    Deceased  . Heart disease Mother   . Hypertension Mother   . Hypertension Father     Deceased  . Stroke Father   . Hypertension Sister   . Hypertension Brother   . Heart defect Daughter     History   Social History  . Marital Status: Single    Spouse Name: N/A  . Number of Children: N/A  . Years of Education: N/A   Social History Main Topics  . Smoking status: Never Smoker   . Smokeless tobacco: Never Used  . Alcohol Use: 0.0 oz/week    0 Standard drinks or equivalent per week     Comment: rare  . Drug Use: No  . Sexual Activity: Not Currently   Other Topics Concern  . 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   Review of Systems - See HPI.  All other ROS are negative.  BP 145/90 mmHg  Pulse 79  Temp(Src) 97.7 F (36.5 C) (Oral)  Resp 16  Ht 5' 3.25" (1.607 m)  Wt 155 lb 6 oz (70.478 kg)  BMI 27.29 kg/m2  SpO2 96%  Physical Exam    Constitutional: She is oriented to person, place, and time and well-developed, well-nourished, and in no distress.  HENT:  Head: Normocephalic and atraumatic.  Right Ear: External ear normal. A middle ear effusion is present.  Left Ear: External ear normal.  Nose: Nose normal.  Mouth/Throat: Oropharynx is clear and moist. No oropharyngeal exudate.  Eyes: Conjunctivae are normal. Pupils are equal, round, and reactive to light.  Neck: Normal range of motion. Neck supple. No thyromegaly present.  Cardiovascular: Normal rate, regular rhythm, normal heart sounds and intact distal pulses.   Pulmonary/Chest: Effort normal and breath sounds normal. No respiratory distress. She has no wheezes. She has no rales. She exhibits no tenderness.  Lymphadenopathy:    She has no cervical adenopathy.  Neurological: She is alert and oriented to person, place, and time. No cranial nerve deficit.  Skin: Skin is warm and dry. No rash noted.  Psychiatric: Affect normal.  Vitals reviewed.   Recent Results (from the past 2160 hour(s))  CBC     Status: None   Collection Time: 03/21/15  2:07 PM  Result Value Ref Range   WBC 8.4 4.0 - 10.5 K/uL   RBC 4.74 3.87 - 5.11 Mil/uL   Platelets 381.0 150.0 - 400.0 K/uL   Hemoglobin  13.7 12.0 - 15.0 g/dL   HCT 41.5 36.0 - 46.0 %   MCV 87.6 78.0 - 100.0 fl   MCHC 33.0 30.0 - 36.0 g/dL   RDW 12.1 11.5 - 15.5 %  Vitamin D (25 hydroxy)     Status: Abnormal   Collection Time: 03/21/15  2:07 PM  Result Value Ref Range   VITD 22.18 (L) 30.00 - 100.00 ng/mL  B12     Status: None   Collection Time: 03/21/15  2:07 PM  Result Value Ref Range   Vitamin B-12 598 211 - 911 pg/mL  POCT urinalysis dipstick     Status: None   Collection Time: 04/03/15 11:33 AM  Result Value Ref Range   Color, UA yellow    Clarity, UA cloudy    Glucose, UA neg    Bilirubin, UA neg    Ketones, UA neg    Spec Grav, UA >=1.030    Blood, UA neg    pH, UA 5.5    Protein, UA neg     Urobilinogen, UA 0.2    Nitrite, UA neg    Leukocytes, UA Negative Negative    Assessment/Plan: Dizziness Resolving on its own. Urine dip negative.  Vitals are stable. PE unremarkable except for middle ear effusions. Rx Flonase. Tylenol if headache recurs.  Continue hydration and well-balanced diet.  Follow-up if symptoms do not continue to resolve.

## 2015-04-03 NOTE — Assessment & Plan Note (Signed)
Resolving on its own. Urine dip negative.  Vitals are stable. PE unremarkable except for middle ear effusions. Rx Flonase. Tylenol if headache recurs.  Continue hydration and well-balanced diet.  Follow-up if symptoms do not continue to resolve.

## 2015-04-03 NOTE — Progress Notes (Signed)
Pre visit review using our clinic review tool, if applicable. No additional management support is needed unless otherwise documented below in the visit note/SLS  

## 2015-04-03 NOTE — Patient Instructions (Signed)
I am glad you are starting to feel better. Please use the Flonase daily as directed to remove fluid behind your ears that is the likely cause of your dizziness. If headache recurs take an ES Tylenol.  Remember to stay well hydrated and eat a well-rounded diet.  Follow-up if symptoms are not resolving over the next 2-3 days.

## 2015-06-15 LAB — HM MAMMOGRAPHY

## 2015-06-30 LAB — HM MAMMOGRAPHY: HM Mammogram: NORMAL

## 2015-11-27 LAB — HM COLONOSCOPY

## 2015-11-28 ENCOUNTER — Encounter: Payer: Self-pay | Admitting: *Deleted

## 2015-12-29 ENCOUNTER — Telehealth: Payer: Self-pay | Admitting: Behavioral Health

## 2015-12-29 NOTE — Telephone Encounter (Signed)
Unable to reach patient at time of Pre-Visit Call.  Left message for patient to return call when available.    

## 2016-01-01 ENCOUNTER — Encounter: Payer: Self-pay | Admitting: Physician Assistant

## 2016-01-01 ENCOUNTER — Ambulatory Visit (INDEPENDENT_AMBULATORY_CARE_PROVIDER_SITE_OTHER): Payer: BLUE CROSS/BLUE SHIELD | Admitting: Physician Assistant

## 2016-01-01 VITALS — BP 122/80 | HR 82 | Temp 97.9°F | Ht 63.25 in | Wt 169.8 lb

## 2016-01-01 DIAGNOSIS — Z136 Encounter for screening for cardiovascular disorders: Secondary | ICD-10-CM | POA: Diagnosis not present

## 2016-01-01 DIAGNOSIS — Z23 Encounter for immunization: Secondary | ICD-10-CM | POA: Diagnosis not present

## 2016-01-01 DIAGNOSIS — Z0000 Encounter for general adult medical examination without abnormal findings: Secondary | ICD-10-CM

## 2016-01-01 LAB — COMPREHENSIVE METABOLIC PANEL
ALT: 16 U/L (ref 0–35)
AST: 20 U/L (ref 0–37)
Albumin: 4.5 g/dL (ref 3.5–5.2)
Alkaline Phosphatase: 90 U/L (ref 39–117)
BUN: 11 mg/dL (ref 6–23)
CO2: 26 mEq/L (ref 19–32)
Calcium: 9.9 mg/dL (ref 8.4–10.5)
Chloride: 105 mEq/L (ref 96–112)
Creatinine, Ser: 0.6 mg/dL (ref 0.40–1.20)
GFR: 127.96 mL/min (ref >60.00)
Glucose, Bld: 88 mg/dL (ref 70–99)
Potassium: 3.9 mEq/L (ref 3.5–5.1)
Sodium: 140 mEq/L (ref 135–145)
Total Bilirubin: 0.5 mg/dL (ref 0.2–1.2)
Total Protein: 8.3 g/dL (ref 6.0–8.3)

## 2016-01-01 LAB — URINALYSIS, ROUTINE W REFLEX MICROSCOPIC
Bilirubin Urine: NEGATIVE
Hgb urine dipstick: NEGATIVE
Ketones, ur: NEGATIVE
Leukocytes, UA: NEGATIVE
Nitrite: NEGATIVE
RBC / HPF: NONE SEEN (ref 0–?)
Specific Gravity, Urine: 1.025 (ref 1.000–1.030)
Total Protein, Urine: NEGATIVE
Urine Glucose: NEGATIVE
Urobilinogen, UA: 0.2 (ref 0.0–1.0)
pH: 5 (ref 5.0–8.0)

## 2016-01-01 LAB — CBC
HCT: 43.8 % (ref 36.0–46.0)
Hemoglobin: 14.3 g/dL (ref 12.0–15.0)
MCHC: 32.7 g/dL (ref 30.0–36.0)
MCV: 89.2 fl (ref 78.0–100.0)
Platelets: 357 10*3/uL (ref 150.0–400.0)
RBC: 4.91 Mil/uL (ref 3.87–5.11)
RDW: 12.4 % (ref 11.5–15.5)
WBC: 8.1 10*3/uL (ref 4.0–10.5)

## 2016-01-01 LAB — LIPID PANEL
Cholesterol: 223 mg/dL — ABNORMAL HIGH (ref 0–200)
HDL: 53.3 mg/dL (ref >39.00)
LDL Cholesterol: 146 mg/dL — ABNORMAL HIGH (ref 0–99)
NonHDL: 169.25
Total CHOL/HDL Ratio: 4
Triglycerides: 116 mg/dL (ref 0.0–149.0)
VLDL: 23.2 mg/dL (ref 0.0–40.0)

## 2016-01-01 LAB — TSH: TSH: 3.01 u[IU]/mL (ref 0.35–4.50)

## 2016-01-01 LAB — HEMOGLOBIN A1C: Hgb A1c MFr Bld: 5.9 % (ref 4.6–6.5)

## 2016-01-01 NOTE — Assessment & Plan Note (Signed)
Depression screen negative. Health Maintenance reviewed -- Mammogram up-to-date (abstracted into EMR from [facility]). Colonoscopy up-to-date. Patient declines flu shot. Pneumovax given today. Preventive schedule discussed and handout given in AVS. Will obtain fasting labs today.

## 2016-01-01 NOTE — Patient Instructions (Addendum)
Please go to the lab for blood work.  I will call you with your results. If your blood work is normal we will follow-up yearly for physicals.  If anything is abnormal we will treat accordingly.  Preventive Care for Adults, Female A healthy lifestyle and preventive care can promote health and wellness. Preventive health guidelines for women include the following key practices.  A routine yearly physical is a good way to check with your health care provider about your health and preventive screening. It is a chance to share any concerns and updates on your health and to receive a thorough exam.  Visit your dentist for a routine exam and preventive care every 6 months. Brush your teeth twice a day and floss once a day. Good oral hygiene prevents tooth decay and gum disease.  The frequency of eye exams is based on your age, health, family medical history, use of contact lenses, and other factors. Follow your health care provider's recommendations for frequency of eye exams.  Eat a healthy diet. Foods like vegetables, fruits, whole grains, low-fat dairy products, and lean protein foods contain the nutrients you need without too many calories. Decrease your intake of foods high in solid fats, added sugars, and salt. Eat the right amount of calories for you.Get information about a proper diet from your health care provider, if necessary.  Regular physical exercise is one of the most important things you can do for your health. Most adults should get at least 150 minutes of moderate-intensity exercise (any activity that increases your heart rate and causes you to sweat) each week. In addition, most adults need muscle-strengthening exercises on 2 or more days a week.  Maintain a healthy weight. The body mass index (BMI) is a screening tool to identify possible weight problems. It provides an estimate of body fat based on height and weight. Your health care provider can find your BMI and can help you  achieve or maintain a healthy weight.For adults 20 years and older:  A BMI below 18.5 is considered underweight.  A BMI of 18.5 to 24.9 is normal.  A BMI of 25 to 29.9 is considered overweight.  A BMI of 30 and above is considered obese.  Maintain normal blood lipids and cholesterol levels by exercising and minimizing your intake of saturated fat. Eat a balanced diet with plenty of fruit and vegetables. Blood tests for lipids and cholesterol should begin at age 40 and be repeated every 5 years. If your lipid or cholesterol levels are high, you are over 50, or you are at high risk for heart disease, you may need your cholesterol levels checked more frequently.Ongoing high lipid and cholesterol levels should be treated with medicines if diet and exercise are not working.  If you smoke, find out from your health care provider how to quit. If you do not use tobacco, do not start.  Lung cancer screening is recommended for adults aged 27-80 years who are at high risk for developing lung cancer because of a history of smoking. A yearly low-dose CT scan of the lungs is recommended for people who have at least a 30-pack-year history of smoking and are a current smoker or have quit within the past 15 years. A pack year of smoking is smoking an average of 1 pack of cigarettes a day for 1 year (for example: 1 pack a day for 30 years or 2 packs a day for 15 years). Yearly screening should continue until the smoker has stopped smoking  for at least 15 years. Yearly screening should be stopped for people who develop a health problem that would prevent them from having lung cancer treatment.  If you are pregnant, do not drink alcohol. If you are breastfeeding, be very cautious about drinking alcohol. If you are not pregnant and choose to drink alcohol, do not have more than 1 drink per day. One drink is considered to be 12 ounces (355 mL) of beer, 5 ounces (148 mL) of wine, or 1.5 ounces (44 mL) of liquor.  Avoid  use of street drugs. Do not share needles with anyone. Ask for help if you need support or instructions about stopping the use of drugs.  High blood pressure causes heart disease and increases the risk of stroke. Your blood pressure should be checked at least every 1 to 2 years. Ongoing high blood pressure should be treated with medicines if weight loss and exercise do not work.  If you are 62-35 years old, ask your health care provider if you should take aspirin to prevent strokes.  Diabetes screening is done by taking a blood sample to check your blood glucose level after you have not eaten for a certain period of time (fasting). If you are not overweight and you do not have risk factors for diabetes, you should be screened once every 3 years starting at age 80. If you are overweight or obese and you are 9-44 years of age, you should be screened for diabetes every year as part of your cardiovascular risk assessment.  Breast cancer screening is essential preventive care for women. You should practice "breast self-awareness." This means understanding the normal appearance and feel of your breasts and may include breast self-examination. Any changes detected, no matter how small, should be reported to a health care provider. Women in their 54s and 30s should have a clinical breast exam (CBE) by a health care provider as part of a regular health exam every 1 to 3 years. After age 19, women should have a CBE every year. Starting at age 73, women should consider having a mammogram (breast X-ray test) every year. Women who have a family history of breast cancer should talk to their health care provider about genetic screening. Women at a high risk of breast cancer should talk to their health care providers about having an MRI and a mammogram every year.  Breast cancer gene (BRCA)-related cancer risk assessment is recommended for women who have family members with BRCA-related cancers. BRCA-related cancers  include breast, ovarian, tubal, and peritoneal cancers. Having family members with these cancers may be associated with an increased risk for harmful changes (mutations) in the breast cancer genes BRCA1 and BRCA2. Results of the assessment will determine the need for genetic counseling and BRCA1 and BRCA2 testing.  Your health care provider may recommend that you be screened regularly for cancer of the pelvic organs (ovaries, uterus, and vagina). This screening involves a pelvic examination, including checking for microscopic changes to the surface of your cervix (Pap test). You may be encouraged to have this screening done every 3 years, beginning at age 67.  For women ages 22-65, health care providers may recommend pelvic exams and Pap testing every 3 years, or they may recommend the Pap and pelvic exam, combined with testing for human papilloma virus (HPV), every 5 years. Some types of HPV increase your risk of cervical cancer. Testing for HPV may also be done on women of any age with unclear Pap test results.  Other  health care providers may not recommend any screening for nonpregnant women who are considered low risk for pelvic cancer and who do not have symptoms. Ask your health care provider if a screening pelvic exam is right for you.  If you have had past treatment for cervical cancer or a condition that could lead to cancer, you need Pap tests and screening for cancer for at least 20 years after your treatment. If Pap tests have been discontinued, your risk factors (such as having a new sexual partner) need to be reassessed to determine if screening should resume. Some women have medical problems that increase the chance of getting cervical cancer. In these cases, your health care provider may recommend more frequent screening and Pap tests.  Colorectal cancer can be detected and often prevented. Most routine colorectal cancer screening begins at the age of 41 years and continues through age 34  years. However, your health care provider may recommend screening at an earlier age if you have risk factors for colon cancer. On a yearly basis, your health care provider may provide home test kits to check for hidden blood in the stool. Use of a small camera at the end of a tube, to directly examine the colon (sigmoidoscopy or colonoscopy), can detect the earliest forms of colorectal cancer. Talk to your health care provider about this at age 48, when routine screening begins. Direct exam of the colon should be repeated every 5-10 years through age 79 years, unless early forms of precancerous polyps or small growths are found.  People who are at an increased risk for hepatitis B should be screened for this virus. You are considered at high risk for hepatitis B if:  You were born in a country where hepatitis B occurs often. Talk with your health care provider about which countries are considered high risk.  Your parents were born in a high-risk country and you have not received a shot to protect against hepatitis B (hepatitis B vaccine).  You have HIV or AIDS.  You use needles to inject street drugs.  You live with, or have sex with, someone who has hepatitis B.  You get hemodialysis treatment.  You take certain medicines for conditions like cancer, organ transplantation, and autoimmune conditions.  Hepatitis C blood testing is recommended for all people born from [birth year] through 1965 and any individual with known risks for hepatitis C.  Practice safe sex. Use condoms and avoid high-risk sexual practices to reduce the spread of sexually transmitted infections (STIs). STIs include gonorrhea, chlamydia, syphilis, trichomonas, herpes, HPV, and human immunodeficiency virus (HIV). Herpes, HIV, and HPV are viral illnesses that have no cure. They can result in disability, cancer, and death.  You should be screened for sexually transmitted illnesses (STIs) including gonorrhea and chlamydia if:  You  are sexually active and are younger than 24 years.  You are older than 24 years and your health care provider tells you that you are at risk for this type of infection.  Your sexual activity has changed since you were last screened and you are at an increased risk for chlamydia or gonorrhea. Ask your health care provider if you are at risk.  If you are at risk of being infected with HIV, it is recommended that you take a prescription medicine daily to prevent HIV infection. This is called preexposure prophylaxis (PrEP). You are considered at risk if:  You are sexually active and do not regularly use condoms or know the HIV status of your  partner(s).  You take drugs by injection.  You are sexually active with a partner who has HIV.  Talk with your health care provider about whether you are at high risk of being infected with HIV. If you choose to begin PrEP, you should first be tested for HIV. You should then be tested every 3 months for as long as you are taking PrEP.  Osteoporosis is a disease in which the bones lose minerals and strength with aging. This can result in serious bone fractures or breaks. The risk of osteoporosis can be identified using a bone density scan. Women ages 104 years and over and women at risk for fractures or osteoporosis should discuss screening with their health care providers. Ask your health care provider whether you should take a calcium supplement or vitamin D to reduce the rate of osteoporosis.  Menopause can be associated with physical symptoms and risks. Hormone replacement therapy is available to decrease symptoms and risks. You should talk to your health care provider about whether hormone replacement therapy is right for you.  Use sunscreen. Apply sunscreen liberally and repeatedly throughout the day. You should seek shade when your shadow is shorter than you. Protect yourself by wearing long sleeves, pants, a wide-brimmed hat, and sunglasses year round,  whenever you are outdoors.  Once a month, do a whole body skin exam, using a mirror to look at the skin on your back. Tell your health care provider of new moles, moles that have irregular borders, moles that are larger than a pencil eraser, or moles that have changed in shape or color.  Stay current with required vaccines (immunizations).  Influenza vaccine. All adults should be immunized every year.  Tetanus, diphtheria, and acellular pertussis (Td, Tdap) vaccine. Pregnant women should receive 1 dose of Tdap vaccine during each pregnancy. The dose should be obtained regardless of the length of time since the last dose. Immunization is preferred during the 27th-36th week of gestation. An adult who has not previously received Tdap or who does not know her vaccine status should receive 1 dose of Tdap. This initial dose should be followed by tetanus and diphtheria toxoids (Td) booster doses every 10 years. Adults with an unknown or incomplete history of completing a 3-dose immunization series with Td-containing vaccines should begin or complete a primary immunization series including a Tdap dose. Adults should receive a Td booster every 10 years.  Varicella vaccine. An adult without evidence of immunity to varicella should receive 2 doses or a second dose if she has previously received 1 dose. Pregnant females who do not have evidence of immunity should receive the first dose after pregnancy. This first dose should be obtained before leaving the health care facility. The second dose should be obtained 4-8 weeks after the first dose.  Human papillomavirus (HPV) vaccine. Females aged 13-26 years who have not received the vaccine previously should obtain the 3-dose series. The vaccine is not recommended for use in pregnant females. However, pregnancy testing is not needed before receiving a dose. If a female is found to be pregnant after receiving a dose, no treatment is needed. In that case, the remaining  doses should be delayed until after the pregnancy. Immunization is recommended for any person with an immunocompromised condition through the age of 31 years if she did not get any or all doses earlier. During the 3-dose series, the second dose should be obtained 4-8 weeks after the first dose. The third dose should be obtained 24 weeks after  the first dose and 16 weeks after the second dose.  Zoster vaccine. One dose is recommended for adults aged 24 years or older unless certain conditions are present.  Measles, mumps, and rubella (MMR) vaccine. Adults born before [birth year] generally are considered immune to measles and mumps. Adults born in [birth year] or later should have 1 or more doses of MMR vaccine unless there is a contraindication to the vaccine or there is laboratory evidence of immunity to each of the three diseases. A routine second dose of MMR vaccine should be obtained at least 28 days after the first dose for students attending postsecondary schools, health care workers, or international travelers. People who received inactivated measles vaccine or an unknown type of measles vaccine during 1963-1967 should receive 2 doses of MMR vaccine. People who received inactivated mumps vaccine or an unknown type of mumps vaccine before 1979 and are at high risk for mumps infection should consider immunization with 2 doses of MMR vaccine. For females of childbearing age, rubella immunity should be determined. If there is no evidence of immunity, females who are not pregnant should be vaccinated. If there is no evidence of immunity, females who are pregnant should delay immunization until after pregnancy. Unvaccinated health care workers born before [birth year] who lack laboratory evidence of measles, mumps, or rubella immunity or laboratory confirmation of disease should consider measles and mumps immunization with 2 doses of MMR vaccine or rubella immunization with 1 dose of MMR vaccine.  Pneumococcal 13-valent conjugate  (PCV13) vaccine. When indicated, a person who is uncertain of his immunization history and has no record of immunization should receive the PCV13 vaccine. All adults 6 years of age and older should receive this vaccine. An adult aged 19 years or older who has certain medical conditions and has not been previously immunized should receive 1 dose of PCV13 vaccine. This PCV13 should be followed with a dose of pneumococcal polysaccharide (PPSV23) vaccine. Adults who are at high risk for pneumococcal disease should obtain the PPSV23 vaccine at least 8 weeks after the dose of PCV13 vaccine. Adults older than 68 years of age who have normal immune system function should obtain the PPSV23 vaccine dose at least 1 year after the dose of PCV13 vaccine.  Pneumococcal polysaccharide (PPSV23) vaccine. When PCV13 is also indicated, PCV13 should be obtained first. All adults aged 31 years and older should be immunized. An adult younger than age 43 years who has certain medical conditions should be immunized. Any person who resides in a nursing home or long-term care facility should be immunized. An adult smoker should be immunized. People with an immunocompromised condition and certain other conditions should receive both PCV13 and PPSV23 vaccines. People with human immunodeficiency virus (HIV) infection should be immunized as soon as possible after diagnosis. Immunization during chemotherapy or radiation therapy should be avoided. Routine use of PPSV23 vaccine is not recommended for American Indians, [place] Natives, or people younger than 65 years unless there are medical conditions that require PPSV23 vaccine. When indicated, people who have unknown immunization and have no record of immunization should receive PPSV23 vaccine. One-time revaccination 5 years after the first dose of PPSV23 is recommended for people aged 19-64 years who have chronic kidney failure, nephrotic syndrome, asplenia, or immunocompromised conditions.  People who received 1-2 doses of PPSV23 before age 39 years should receive another dose of PPSV23 vaccine at age 45 years or later if at least 5 years have passed since the previous dose. Doses of PPSV23 are not  needed for people immunized with PPSV23 at or after age 39 years.  Meningococcal vaccine. Adults with asplenia or persistent complement component deficiencies should receive 2 doses of quadrivalent meningococcal conjugate (MenACWY-D) vaccine. The doses should be obtained at least 2 months apart. Microbiologists working with certain meningococcal bacteria, [place] recruits, people at risk during an outbreak, and people who travel to or live in countries with a high rate of meningitis should be immunized. A first-year college student up through age 28 years who is living in a residence hall should receive a dose if she did not receive a dose on or after her 16th birthday. Adults who have certain high-risk conditions should receive one or more doses of vaccine.  Hepatitis A vaccine. Adults who wish to be protected from this disease, have certain high-risk conditions, work with hepatitis A-infected animals, work in hepatitis A research labs, or travel to or work in countries with a high rate of hepatitis A should be immunized. Adults who were previously unvaccinated and who anticipate close contact with an international adoptee during the first 60 days after arrival in the [place] from a country with a high rate of hepatitis A should be immunized.  Hepatitis B vaccine. Adults who wish to be protected from this disease, have certain high-risk conditions, may be exposed to blood or other infectious body fluids, are household contacts or sex partners of hepatitis B positive people, are clients or workers in certain care facilities, or travel to or work in countries with a high rate of hepatitis B should be immunized.  Haemophilus influenzae type b (Hib) vaccine. A previously unvaccinated person with  asplenia or sickle cell disease or having a scheduled splenectomy should receive 1 dose of Hib vaccine. Regardless of previous immunization, a recipient of a hematopoietic stem cell transplant should receive a 3-dose series 6-12 months after her successful transplant. Hib vaccine is not recommended for adults with HIV infection. Preventive Services / Frequency Ages 65 to 28 years  Blood pressure check.** / Every 3-5 years.  Lipid and cholesterol check.** / Every 5 years beginning at age 33.  Clinical breast exam.** / Every 3 years for women in their 22s and 12s.  BRCA-related cancer risk assessment.** / For women who have family members with a BRCA-related cancer (breast, ovarian, tubal, or peritoneal cancers).  Pap test.** / Every 2 years from ages 2 through 44. Every 3 years starting at age 31 through age 56 or 70 with a history of 3 consecutive normal Pap tests.  HPV screening.** / Every 3 years from ages 71 through ages 16 to 81 with a history of 3 consecutive normal Pap tests.  Hepatitis C blood test.** / For any individual with known risks for hepatitis C.  Skin self-exam. / Monthly.  Influenza vaccine. / Every year.  Tetanus, diphtheria, and acellular pertussis (Tdap, Td) vaccine.** / Consult your health care provider. Pregnant women should receive 1 dose of Tdap vaccine during each pregnancy. 1 dose of Td every 10 years.  Varicella vaccine.** / Consult your health care provider. Pregnant females who do not have evidence of immunity should receive the first dose after pregnancy.  HPV vaccine. / 3 doses over 6 months, if 92 and younger. The vaccine is not recommended for use in pregnant females. However, pregnancy testing is not needed before receiving a dose.  Measles, mumps, rubella (MMR) vaccine.** / You need at least 1 dose of MMR if you were born in 1957 or later. You may also need  a 2nd dose. For females of childbearing age, rubella immunity should be determined. If there is  no evidence of immunity, females who are not pregnant should be vaccinated. If there is no evidence of immunity, females who are pregnant should delay immunization until after pregnancy.  Pneumococcal 13-valent conjugate (PCV13) vaccine.** / Consult your health care provider.  Pneumococcal polysaccharide (PPSV23) vaccine.** / 1 to 2 doses if you smoke cigarettes or if you have certain conditions.  Meningococcal vaccine.** / 1 dose if you are age 41 to 1 years and a Market researcher living in a residence hall, or have one of several medical conditions, you need to get vaccinated against meningococcal disease. You may also need additional booster doses.  Hepatitis A vaccine.** / Consult your health care provider.  Hepatitis B vaccine.** / Consult your health care provider.  Haemophilus influenzae type b (Hib) vaccine.** / Consult your health care provider. Ages 25 to 58 years  Blood pressure check.** / Every year.  Lipid and cholesterol check.** / Every 5 years beginning at age 63 years.  Lung cancer screening. / Every year if you are aged 46-80 years and have a 30-pack-year history of smoking and currently smoke or have quit within the past 15 years. Yearly screening is stopped once you have quit smoking for at least 15 years or develop a health problem that would prevent you from having lung cancer treatment.  Clinical breast exam.** / Every year after age 66 years.  BRCA-related cancer risk assessment.** / For women who have family members with a BRCA-related cancer (breast, ovarian, tubal, or peritoneal cancers).  Mammogram.** / Every year beginning at age 84 years and continuing for as long as you are in good health. Consult with your health care provider.  Pap test.** / Every 3 years starting at age 45 years through age 70 or 35 years with a history of 3 consecutive normal Pap tests.  HPV screening.** / Every 3 years from ages 73 years through ages 38 to 34 years with a  history of 3 consecutive normal Pap tests.  Fecal occult blood test (FOBT) of stool. / Every year beginning at age 27 years and continuing until age 72 years. You may not need to do this test if you get a colonoscopy every 10 years.  Flexible sigmoidoscopy or colonoscopy.** / Every 5 years for a flexible sigmoidoscopy or every 10 years for a colonoscopy beginning at age 51 years and continuing until age 34 years.  Hepatitis C blood test.** / For all people born from [birth year] through 1963 and any individual with known risks for hepatitis C.  Skin self-exam. / Monthly.  Influenza vaccine. / Every year.  Tetanus, diphtheria, and acellular pertussis (Tdap/Td) vaccine.** / Consult your health care provider. Pregnant women should receive 1 dose of Tdap vaccine during each pregnancy. 1 dose of Td every 10 years.  Varicella vaccine.** / Consult your health care provider. Pregnant females who do not have evidence of immunity should receive the first dose after pregnancy.  Zoster vaccine.** / 1 dose for adults aged 15 years or older.  Measles, mumps, rubella (MMR) vaccine.** / You need at least 1 dose of MMR if you were born in 1957 or later. You may also need a second dose. For females of childbearing age, rubella immunity should be determined. If there is no evidence of immunity, females who are not pregnant should be vaccinated. If there is no evidence of immunity, females who are pregnant should delay immunization until  after pregnancy.  Pneumococcal 13-valent conjugate (PCV13) vaccine.** / Consult your health care provider.  Pneumococcal polysaccharide (PPSV23) vaccine.** / 1 to 2 doses if you smoke cigarettes or if you have certain conditions.  Meningococcal vaccine.** / Consult your health care provider.  Hepatitis A vaccine.** / Consult your health care provider.  Hepatitis B vaccine.** / Consult your health care provider.  Haemophilus influenzae type b (Hib) vaccine.** / Consult your health  care provider. Ages 19 years and over  Blood pressure check.** / Every year.  Lipid and cholesterol check.** / Every 5 years beginning at age 57 years.  Lung cancer screening. / Every year if you are aged 34-80 years and have a 30-pack-year history of smoking and currently smoke or have quit within the past 15 years. Yearly screening is stopped once you have quit smoking for at least 15 years or develop a health problem that would prevent you from having lung cancer treatment.  Clinical breast exam.** / Every year after age 3 years.  BRCA-related cancer risk assessment.** / For women who have family members with a BRCA-related cancer (breast, ovarian, tubal, or peritoneal cancers).  Mammogram.** / Every year beginning at age 69 years and continuing for as long as you are in good health. Consult with your health care provider.  Pap test.** / Every 3 years starting at age 46 years through age 14 or 42 years with 3 consecutive normal Pap tests. Testing can be stopped between 65 and 70 years with 3 consecutive normal Pap tests and no abnormal Pap or HPV tests in the past 10 years.  HPV screening.** / Every 3 years from ages 25 years through ages 44 or 55 years with a history of 3 consecutive normal Pap tests. Testing can be stopped between 65 and 70 years with 3 consecutive normal Pap tests and no abnormal Pap or HPV tests in the past 10 years.  Fecal occult blood test (FOBT) of stool. / Every year beginning at age 16 years and continuing until age 63 years. You may not need to do this test if you get a colonoscopy every 10 years.  Flexible sigmoidoscopy or colonoscopy.** / Every 5 years for a flexible sigmoidoscopy or every 10 years for a colonoscopy beginning at age 57 years and continuing until age 31 years.  Hepatitis C blood test.** / For all people born from [birth year] through 1963 and any individual with known risks for hepatitis C.  Osteoporosis screening.** / A one-time screening for women  ages 24 years and over and women at risk for fractures or osteoporosis.  Skin self-exam. / Monthly.  Influenza vaccine. / Every year.  Tetanus, diphtheria, and acellular pertussis (Tdap/Td) vaccine.** / 1 dose of Td every 10 years.  Varicella vaccine.** / Consult your health care provider.  Zoster vaccine.** / 1 dose for adults aged 55 years or older.  Pneumococcal 13-valent conjugate (PCV13) vaccine.** / Consult your health care provider.  Pneumococcal polysaccharide (PPSV23) vaccine.** / 1 dose for all adults aged 24 years and older.  Meningococcal vaccine.** / Consult your health care provider.  Hepatitis A vaccine.** / Consult your health care provider.  Hepatitis B vaccine.** / Consult your health care provider.  Haemophilus influenzae type b (Hib) vaccine.** / Consult your health care provider. ** Family history and personal history of risk and conditions may change your health care provider's recommendations.   This information is not intended to replace advice given to you by your health care provider. Make sure you discuss any  questions you have with your health care provider.   Document Released: 11/26/2001 Document Revised: 10/21/2014 Document Reviewed: 02/25/2011 Elsevier Interactive Patient Education [employer].

## 2016-01-01 NOTE — Progress Notes (Signed)
Patient presents to clinic today for annual exam.  Patient is fasting for labs.  Acute Concerns: Denies acute concerns at today's visit.  Health Maintenance: Immunizations -- Declines flu shot today. Will get Pneumovax. Colonoscopy -- up-to-date Mammogram -- patient endorses having in 2016 at [place] reviewed. Normal mammogram on 06/30/15. PAP -- s/p hysterectomy Bone Density -- up-to-date  Past Medical History  Diagnosis Date  . Elevated blood pressure (not hypertension)   . 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 Frequent headaches     Past Surgical History  Procedure Laterality Date  . Breast biopsy  1995  . Knee surgery      Left Arthroscopy  . Abdominal hysterectomy  1995  . Cataract extraction  2013 & 2015    Bilateral    Current Outpatient Prescriptions on File Prior to Visit  Medication Sig Dispense Refill  . acetaminophen (TYLENOL) 325 MG tablet Take 650 mg by mouth every 6 (six) hours as needed.    . Multiple Vitamins-Minerals (MULTIVITAMIN ADULTS 50+) TABS Take by mouth daily.    . naproxen sodium (ANAPROX) 220 MG tablet Take 220 mg by mouth daily as needed.     No current facility-administered medications on file prior to visit.    Allergies  Allergen Reactions  . Tramadol Nausea Only and Other (See Comments)    Dizziness    Family History  Problem Relation Age of Onset  . Breast cancer Mother 10    Deceased  . Heart disease Mother   . Hypertension Mother   . Hypertension Father     Deceased  . Stroke Father   . Hypertension Sister   . Hypertension Brother   . Heart defect Daughter     Social History   Social History  . Marital Status: Single    Spouse Name: N/A  . Number of Children: N/A  . Years of Education: N/A   Occupational History  . Not on file.   Social History Main Topics  . Smoking status: Never Smoker   . Smokeless tobacco: Never Used  . Alcohol Use: 0.0  oz/week    0 Standard drinks or equivalent per week     Comment: rare  . Drug Use: No  . Sexual Activity: Not Currently   Other Topics Concern  . 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   Review of Systems  Constitutional: Negative for fever and weight loss.  HENT: Negative for ear discharge, ear pain, hearing loss and tinnitus.   Eyes: Negative for blurred vision, double vision, photophobia and pain.  Respiratory: Negative for cough and shortness of breath.   Cardiovascular: Negative for chest pain and palpitations.  Gastrointestinal: Negative for heartburn, nausea, vomiting, abdominal pain, diarrhea, constipation, blood in stool and melena.  Genitourinary: Negative for dysuria, urgency, frequency, hematuria and flank pain.  Musculoskeletal: Negative for falls.  Neurological: Negative for dizziness, loss of consciousness and headaches.  Endo/Heme/Allergies: Negative for environmental allergies.  Psychiatric/Behavioral: Negative for depression, suicidal ideas, hallucinations and substance abuse. The patient is not nervous/anxious and does not have insomnia.     BP 122/80 mmHg  Pulse 82  Temp(Src) 97.9 F (36.6 C) (Oral)  Ht 5' 3.25" (1.607 m)  Wt 169 lb 12.8 oz (77.021 kg)  BMI 29.82 kg/m2  SpO2 97%  Physical Exam  Constitutional: She is oriented to person, place, and time and well-developed, well-nourished,  and in no distress.  HENT:  Head: Normocephalic and atraumatic.  Right Ear: Tympanic membrane, external ear and ear canal normal.  Left Ear: Tympanic membrane, external ear and ear canal normal.  Nose: Nose normal. No mucosal edema.  Mouth/Throat: Uvula is midline, oropharynx is clear and moist and mucous membranes are normal. No oropharyngeal exudate or posterior oropharyngeal erythema.  Eyes: Conjunctivae are normal. Pupils are equal, round, and reactive to light.  Neck: Neck supple. No thyromegaly present.  Cardiovascular: Normal rate,  regular rhythm, normal heart sounds and intact distal pulses.   Pulmonary/Chest: Effort normal and breath sounds normal. No respiratory distress. She has no wheezes. She has no rales.  Abdominal: Soft. Bowel sounds are normal. She exhibits no distension and no mass. There is no tenderness. There is no rebound and no guarding.  Lymphadenopathy:    She has no cervical adenopathy.  Neurological: She is alert and oriented to person, place, and time. No cranial nerve deficit.  Skin: Skin is warm and dry. No rash noted.  Psychiatric: Affect normal.  Vitals reviewed.  Recent Results (from the past 2160 hour(s))  HM COLONOSCOPY     Status: None   Collection Time: 11/27/15 12:00 AM  Result Value Ref Range   HM Colonoscopy      Multiple colon polyps, s/p polypectomy,Diverticulosis-Next colonoscopy (3-20yrs)    Assessment/Plan: Visit for preventive health examination Depression screen negative. Health Maintenance reviewed -- Mammogram up-to-date (abstracted into EMR from [facility] Colonoscopy up-to-date. Patient declines flu shot. Pneumovax given today. Preventive schedule discussed and handout given in AVS. Will obtain fasting labs today.   Screening for ischemic heart disease EKG with NSR today in office. Patient to begin 81 mg ASA. Fasting lipid panel obtained today.

## 2016-01-01 NOTE — Progress Notes (Signed)
Pre visit review using our clinic review tool, if applicable. No additional management support is needed unless otherwise documented below in the visit note. 

## 2016-01-01 NOTE — Assessment & Plan Note (Signed)
EKG with NSR today in office. Patient to begin 81 mg ASA. Fasting lipid panel obtained today.

## 2016-01-05 ENCOUNTER — Other Ambulatory Visit: Payer: Self-pay | Admitting: Physician Assistant

## 2016-01-05 MED ORDER — SIMVASTATIN 10 MG PO TABS: 10.0000 mg | ORAL_TABLET | Freq: Every day | ORAL | Status: DC

## 2016-03-18 DIAGNOSIS — M170 Bilateral primary osteoarthritis of knee: Secondary | ICD-10-CM | POA: Diagnosis not present

## 2016-03-20 ENCOUNTER — Telehealth: Payer: Self-pay | Admitting: Physician Assistant

## 2016-03-20 NOTE — Telephone Encounter (Signed)
Received surgical clearance form from [facility]. She needs appointment for surgical clearance.

## 2016-03-21 NOTE — Telephone Encounter (Signed)
LM for pt to call to sch 30 min surgical clearance appt

## 2016-04-01 ENCOUNTER — Encounter: Payer: Self-pay | Admitting: Physician Assistant

## 2016-04-01 ENCOUNTER — Ambulatory Visit (INDEPENDENT_AMBULATORY_CARE_PROVIDER_SITE_OTHER): Payer: BLUE CROSS/BLUE SHIELD | Admitting: Physician Assistant

## 2016-04-01 VITALS — BP 130/82 | HR 74 | Temp 97.6°F | Resp 16 | Ht 63.0 in | Wt 165.1 lb

## 2016-04-01 DIAGNOSIS — Z01818 Encounter for other preprocedural examination: Secondary | ICD-10-CM

## 2016-04-01 DIAGNOSIS — Z23 Encounter for immunization: Secondary | ICD-10-CM | POA: Diagnosis not present

## 2016-04-01 DIAGNOSIS — E785 Hyperlipidemia, unspecified: Secondary | ICD-10-CM | POA: Diagnosis not present

## 2016-04-01 LAB — LIPID PANEL
Cholesterol: 166 mg/dL (ref 0–200)
HDL: 55.5 mg/dL (ref >39.00)
LDL Cholesterol: 91 mg/dL (ref 0–99)
NonHDL: 110.5
Total CHOL/HDL Ratio: 3
Triglycerides: 98 mg/dL (ref 0.0–149.0)
VLDL: 19.6 mg/dL (ref 0.0–40.0)

## 2016-04-01 LAB — COMPREHENSIVE METABOLIC PANEL
ALT: 13 U/L (ref 0–35)
AST: 20 U/L (ref 0–37)
Albumin: 4.5 g/dL (ref 3.5–5.2)
Alkaline Phosphatase: 87 U/L (ref 39–117)
BUN: 11 mg/dL (ref 6–23)
CO2: 23 mEq/L (ref 19–32)
Calcium: 9.8 mg/dL (ref 8.4–10.5)
Chloride: 105 mEq/L (ref 96–112)
Creatinine, Ser: 0.61 mg/dL (ref 0.40–1.20)
GFR: 125.45 mL/min (ref >60.00)
Glucose, Bld: 98 mg/dL (ref 70–99)
Potassium: 3.9 mEq/L (ref 3.5–5.1)
Sodium: 139 mEq/L (ref 135–145)
Total Bilirubin: 0.5 mg/dL (ref 0.2–1.2)
Total Protein: 8.3 g/dL (ref 6.0–8.3)

## 2016-04-01 LAB — PROTIME-INR
INR: 1.1 ratio — ABNORMAL HIGH (ref 0.8–1.0)
Prothrombin Time: 11.8 s (ref 9.6–13.1)

## 2016-04-01 NOTE — Addendum Note (Signed)
Addended by: Rockwell Germany on: 04/01/2016 10:47 AM   Modules accepted: Orders

## 2016-04-01 NOTE — Assessment & Plan Note (Signed)
Taking statin as directed. Will repeat lipids.

## 2016-04-01 NOTE — Progress Notes (Signed)
Patient presents to clinic today for surgical clearance. Patient is scheduled for L TKA  with Orthopedics. Date has not been scheduled. Patient with history fo intermittent elevated BP but no diagnosis of hypertension. Denies history of MI or CVA. No known chronic lung disease. Patient denies chest pain, palpitations, lightheadedness, dizziness, vision changes or frequent headaches. Patient is not currently on any blood thinners. Patient is a never smoker.  Past Medical History  Diagnosis Date  . Elevated blood pressure (not hypertension)   . 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 Frequent headaches     Current Outpatient Prescriptions on File Prior to Visit  Medication Sig Dispense Refill  . acetaminophen (TYLENOL) 325 MG tablet Take 650 mg by mouth every 6 (six) hours as needed.    . Multiple Vitamins-Minerals (MULTIVITAMIN ADULTS 50+) TABS Take by mouth daily.    . naproxen sodium (ANAPROX) 220 MG tablet Take 220 mg by mouth daily as needed.    . simvastatin (ZOCOR) 10 MG tablet Take 1 tablet (10 mg total) by mouth daily. 30 tablet 2   No current facility-administered medications on file prior to visit.    Allergies  Allergen Reactions  . Tramadol Nausea Only and Other (See Comments)    Dizziness    Family History  Problem Relation Age of Onset  . Breast cancer Mother 66    Deceased  . Heart disease Mother   . Hypertension Mother   . Hypertension Father     Deceased  . Stroke Father   . Hypertension Sister   . Hypertension Brother   . Heart defect Daughter     Social History   Social History  . Marital Status: Single    Spouse Name: N/A  . Number of Children: N/A  . Years of Education: N/A   Social History Main Topics  . Smoking status: Never Smoker   . Smokeless tobacco: Never Used  . Alcohol Use: 0.0 oz/week    0 Standard drinks or equivalent per week     Comment: rare  . Drug Use: No  . Sexual Activity: Not  Currently   Other Topics Concern  . 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   Review of Systems - See HPI.  All other ROS are negative.  BP 130/82 mmHg  Pulse 74  Temp(Src) 97.6 F (36.4 C) (Oral)  Resp 16  Ht 5\' 3"  (1.6 m)  Wt 165 lb 2 oz (74.9 kg)  BMI 29.26 kg/m2  SpO2 98%  Physical Exam  Constitutional: She is oriented to person, place, and time and well-developed, well-nourished, and in no distress.  HENT:  Head: Normocephalic and atraumatic.  Right Ear: External ear normal.  Left Ear: External ear normal.  Nose: Nose normal.  Mouth/Throat: Oropharynx is clear and moist. No oropharyngeal exudate.  Eyes: Conjunctivae are normal.  Neck: Neck supple.  Cardiovascular: Normal rate, regular rhythm, normal heart sounds and intact distal pulses.   Pulmonary/Chest: Effort normal and breath sounds normal. No respiratory distress. She has no wheezes. She has no rales. She exhibits no tenderness.  Neurological: She is alert and oriented to person, place, and time.  Skin: Skin is warm. No rash noted.  Psychiatric: Affect normal.  Vitals reviewed.  Assessment/Plan: Preoperative clearance Vitals looks good. Exam unremarkable. EKG with sinus rhythm. Unchanged overall for prior reads. Will check labs today.  If all is good will grant  clearance and fax forms to Ortho.  Hyperlipidemia Taking statin as directed. Will repeat lipids.     Leeanne Rio, PA-C

## 2016-04-01 NOTE — Assessment & Plan Note (Signed)
Vitals looks good. Exam unremarkable. EKG with sinus rhythm. Unchanged overall for prior reads. Will check labs today.  If all is good will grant clearance and fax forms to Ortho.

## 2016-04-01 NOTE — Patient Instructions (Signed)
Please go to the lab for blood work. I will call you with your results. As long as those look good, we will grant surgical clearance.   Continue medications as directed. Place a humidifier in the bedroom to see if these prevents morning headaches.

## 2016-04-01 NOTE — Progress Notes (Signed)
Pre visit review using our clinic review tool, if applicable. No additional management support is needed unless otherwise documented below in the visit note/SLS  

## 2016-04-11 ENCOUNTER — Other Ambulatory Visit: Payer: Self-pay | Admitting: Surgical

## 2016-04-19 ENCOUNTER — Encounter (HOSPITAL_COMMUNITY): Payer: Self-pay

## 2016-04-19 ENCOUNTER — Ambulatory Visit (HOSPITAL_COMMUNITY)
Admission: RE | Admit: 2016-04-19 | Discharge: 2016-04-19 | Disposition: A | Discharge status: Home / Self Care | Payer: BLUE CROSS/BLUE SHIELD | Source: Ambulatory Visit | Attending: Surgical | Admitting: Surgical

## 2016-04-19 ENCOUNTER — Encounter (HOSPITAL_COMMUNITY)
Admission: RE | Admit: 2016-04-19 | Discharge: 2016-04-19 | Disposition: A | Discharge status: Home / Self Care | Payer: BLUE CROSS/BLUE SHIELD | Source: Ambulatory Visit | Attending: Orthopedic Surgery | Admitting: Orthopedic Surgery

## 2016-04-19 DIAGNOSIS — R938 Abnormal findings on diagnostic imaging of other specified body structures: Secondary | ICD-10-CM | POA: Insufficient documentation

## 2016-04-19 DIAGNOSIS — Z01818 Encounter for other preprocedural examination: Secondary | ICD-10-CM | POA: Insufficient documentation

## 2016-04-19 LAB — SURGICAL PCR SCREEN
MRSA, PCR: NEGATIVE
Staphylococcus aureus: NEGATIVE

## 2016-04-19 LAB — URINALYSIS, ROUTINE W REFLEX MICROSCOPIC
Bilirubin Urine: NEGATIVE
Glucose, UA: NEGATIVE mg/dL
Ketones, ur: NEGATIVE mg/dL
Leukocytes, UA: NEGATIVE
Nitrite: NEGATIVE
Protein, ur: NEGATIVE mg/dL
Specific Gravity, Urine: 1.023 (ref 1.005–1.030)
pH: 5 (ref 5.0–8.0)

## 2016-04-19 LAB — CBC WITH DIFFERENTIAL/PLATELET
Basophils Absolute: 0 10*3/uL (ref 0.0–0.1)
Basophils Relative: 0 %
Eosinophils Absolute: 0 10*3/uL (ref 0.0–0.7)
Eosinophils Relative: 0 %
HCT: 41.2 % (ref 36.0–46.0)
Hemoglobin: 13.6 g/dL (ref 12.0–15.0)
Lymphocytes Relative: 24 %
Lymphs Abs: 2.1 10*3/uL (ref 0.7–4.0)
MCH: 28.7 pg (ref 26.0–34.0)
MCHC: 33 g/dL (ref 30.0–36.0)
MCV: 86.9 fL (ref 78.0–100.0)
Monocytes Absolute: 0.5 10*3/uL (ref 0.1–1.0)
Monocytes Relative: 5 %
Neutro Abs: 6.3 10*3/uL (ref 1.7–7.7)
Neutrophils Relative %: 71 %
Platelets: 322 10*3/uL (ref 150–400)
RBC: 4.74 MIL/uL (ref 3.87–5.11)
RDW: 12.4 % (ref 11.5–15.5)
WBC: 8.9 10*3/uL (ref 4.0–10.5)

## 2016-04-19 LAB — PROTIME-INR
INR: 1.03 (ref 0.00–1.49)
Prothrombin Time: 13.7 seconds (ref 11.6–15.2)

## 2016-04-19 LAB — URINE MICROSCOPIC-ADD ON

## 2016-04-19 LAB — APTT: aPTT: 31 seconds (ref 24–37)

## 2016-04-19 LAB — ABO/RH: ABO/RH(D): O POS

## 2016-04-19 NOTE — Patient Instructions (Addendum)
Melanie Lee  04/19/2016   Your procedure is scheduled on: 04-23-16  Report to [facility] Main  Entrance take [facility]  elevators to 3rd floor to  [place] at 1000 AM.  Call this number if you have problems the morning of surgery [phone]   Remember: ONLY 1 PERSON MAY GO WITH YOU TO SHORT STAY TO GET  READY MORNING OF [place].  Do not eat food or drink liquids :After Midnight.     Take these medicines the morning of surgery with A SIP OF WATER: NONE. DO NOT TAKE ANY DIABETIC MEDICATIONS DAY OF YOUR SURGERY                               You may not have any metal on your body including hair pins and              piercings  Do not wear jewelry, make-up, lotions, powders or perfumes, deodorant             Do not wear nail polish.  Do not shave  48 hours prior to surgery.              Men may shave face and neck.   Do not bring valuables to the hospital. [place].  Contacts, dentures or bridgework may not be worn into surgery.  Leave suitcase in the car. After surgery it may be brought to your room.     Patients discharged the day of surgery will not be allowed to drive home.  Name and phone number of your driver:[name] cell  Special Instructions: N/A              Please read over the following fact sheets you were given: _____________________________________________________________________             [facility] - Preparing for Surgery Before surgery, you can play an important role.  Because skin is not sterile, your skin needs to be as free of germs as possible.  You can reduce the number of germs on your skin by washing with CHG (chlorahexidine gluconate) soap before surgery.  CHG is an antiseptic cleaner which kills germs and bonds with the skin to continue killing germs even after washing. Please DO NOT use if you have an allergy to CHG or antibacterial  soaps.  If your skin becomes reddened/irritated stop using the CHG and inform your nurse when you arrive at Short Stay. Do not shave (including legs and underarms) for at least 48 hours prior to the first CHG shower.  You may shave your face/neck. Please follow these instructions carefully:  1.  Shower with CHG Soap the night before surgery and the  morning of Surgery.  2.  If you choose to wash your hair, wash your hair first as usual with your  normal  shampoo.  3.  After you shampoo, rinse your hair and body thoroughly to remove the  shampoo.                           4.  Use CHG as you would any other liquid soap.  You can apply chg directly  to the skin and  wash                       Gently with a scrungie or clean washcloth.  5.  Apply the CHG Soap to your body ONLY FROM THE NECK DOWN.   Do not use on face/ open                           Wound or open sores. Avoid contact with eyes, ears mouth and genitals (private parts).                       Wash face,  Genitals (private parts) with your normal soap.             6.  Wash thoroughly, paying special attention to the area where your surgery  will be performed.  7.  Thoroughly rinse your body with warm water from the neck down.  8.  DO NOT shower/wash with your normal soap after using and rinsing off  the CHG Soap.                9.  Pat yourself dry with a clean towel.            10.  Wear clean pajamas.            11.  Place clean sheets on your bed the night of your first shower and do not  sleep with pets. Day of Surgery : Do not apply any lotions/deodorants the morning of surgery.  Please wear clean clothes to the hospital/surgery center.  FAILURE TO FOLLOW THESE INSTRUCTIONS MAY RESULT IN THE CANCELLATION OF YOUR SURGERY PATIENT SIGNATURE_________________________________  NURSE SIGNATURE__________________________________  ________________________________________________________________________   Adam Phenix  An  incentive spirometer is a tool that can help keep your lungs clear and active. This tool measures how well you are filling your lungs with each breath. Taking long deep breaths may help reverse or decrease the chance of developing breathing (pulmonary) problems (especially infection) following:  A long period of time when you are unable to move or be active. BEFORE THE PROCEDURE   If the spirometer includes an indicator to show your best effort, your nurse or respiratory therapist will set it to a desired goal.  If possible, sit up straight or lean slightly forward. Try not to slouch.  Hold the incentive spirometer in an upright position. INSTRUCTIONS FOR USE   Sit on the edge of your bed if possible, or sit up as far as you can in bed or on a chair.  Hold the incentive spirometer in an upright position.  Breathe out normally.  Place the mouthpiece in your mouth and seal your lips tightly around it.  Breathe in slowly and as deeply as possible, raising the piston or the ball toward the top of the column.  Hold your breath for 3-5 seconds or for as long as possible. Allow the piston or ball to fall to the bottom of the column.  Remove the mouthpiece from your mouth and breathe out normally.  Rest for a few seconds and repeat Steps 1 through 7 at least 10 times every 1-2 hours when you are awake. Take your time and take a few normal breaths between deep breaths.  The spirometer may include an indicator to show your best effort. Use the indicator as a goal to work toward during each repetition.  After each set of 10 deep  breaths, practice coughing to be sure your lungs are clear. If you have an incision (the cut made at the time of surgery), support your incision when coughing by placing a pillow or rolled up towels firmly against it. Once you are able to get out of bed, walk around indoors and cough well. You may stop using the incentive spirometer when instructed by your caregiver.   RISKS AND COMPLICATIONS  Take your time so you do not get dizzy or light-headed.  If you are in pain, you may need to take or ask for pain medication before doing incentive spirometry. It is harder to take a deep breath if you are having pain. AFTER USE  Rest and breathe slowly and easily.  It can be helpful to keep track of a log of your progress. Your caregiver can provide you with a simple table to help with this. If you are using the spirometer at home, follow these instructions: [place] IF:   You are having difficultly using the spirometer.  You have trouble using the spirometer as often as instructed.  Your pain medication is not giving enough relief while using the spirometer.  You develop fever of 100.5 F (38.1 C) or higher. SEEK IMMEDIATE MEDICAL CARE IF:   You cough up bloody sputum that had not been present before.  You develop fever of 102 F (38.9 C) or greater.  You develop worsening pain at or near the incision site. MAKE SURE YOU:   Understand these instructions.  Will watch your condition.  Will get help right away if you are not doing well or get worse. Document Released: 02/10/2007 Document Revised: 12/23/2011 Document Reviewed: 04/13/2007 ExitCare Patient Information 2014 Exit[employer], [place].   ________________________________________________________________________  WHAT IS A BLOOD TRANSFUSION? Blood Transfusion Information  A transfusion is the replacement of blood or some of its parts. Blood is made up of multiple cells which provide different functions.  Red blood cells carry oxygen and are used for blood loss replacement.  White blood cells fight against infection.  Platelets control bleeding.  Plasma helps clot blood.  Other blood products are available for specialized needs, such as hemophilia or other clotting disorders. BEFORE THE TRANSFUSION  Who gives blood for transfusions?   Healthy volunteers who are fully evaluated  to make sure their blood is safe. This is blood bank blood. Transfusion therapy is the safest it has ever been in the practice of medicine. Before blood is taken from a donor, a complete history is taken to make sure that person has no history of diseases nor engages in risky social behavior (examples are intravenous drug use or sexual activity with multiple partners). The donor's travel history is screened to minimize risk of transmitting infections, such as malaria. The donated blood is tested for signs of infectious diseases, such as HIV and hepatitis. The blood is then tested to be sure it is compatible with you in order to minimize the chance of a transfusion reaction. If you or a relative donates blood, this is often done in anticipation of surgery and is not appropriate for emergency situations. It takes many days to process the donated blood. RISKS AND COMPLICATIONS Although transfusion therapy is very safe and saves many lives, the main dangers of transfusion include:   Getting an infectious disease.  Developing a transfusion reaction. This is an allergic reaction to something in the blood you were given. Every precaution is taken to prevent this. The decision to have a blood transfusion has  been considered carefully by your caregiver before blood is given. Blood is not given unless the benefits outweigh the risks. AFTER THE TRANSFUSION  Right after receiving a blood transfusion, you will usually feel much better and more energetic. This is especially true if your red blood cells have gotten low (anemic). The transfusion raises the level of the red blood cells which carry oxygen, and this usually causes an energy increase.  The nurse administering the transfusion will monitor you carefully for complications. HOME CARE INSTRUCTIONS  No special instructions are needed after a transfusion. You may find your energy is better. Speak with your caregiver about any limitations on activity for  underlying diseases you may have. SEEK MEDICAL CARE IF:   Your condition is not improving after your transfusion.  You develop redness or irritation at the intravenous (IV) site. SEEK IMMEDIATE MEDICAL CARE IF:  Any of the following symptoms occur over the next 12 hours:  Shaking chills.  You have a temperature by mouth above 102 F (38.9 C), not controlled by medicine.  Chest, back, or muscle pain.  People around you feel you are not acting correctly or are confused.  Shortness of breath or difficulty breathing.  Dizziness and fainting.  You get a rash or develop hives.  You have a decrease in urine output.  Your urine turns a dark color or changes to pink, red, or brown. Any of the following symptoms occur over the next 10 days:  You have a temperature by mouth above 102 F (38.9 C), not controlled by medicine.  Shortness of breath.  Weakness after normal activity.  The white part of the eye turns yellow (jaundice).  You have a decrease in the amount of urine or are urinating less often.  Your urine turns a dark color or changes to pink, red, or brown. Document Released: 09/27/2000 Document Revised: 12/23/2011 Document Reviewed: 05/16/2008 [facility] Patient Information 2014 [place], [place].  _______________________________________________________________________

## 2016-04-19 NOTE — Pre-Procedure Instructions (Signed)
EKG 6'17, Clearance note/LOV notes, Labs-CMP,LP,A1C, Pt/INR [id number] with chart.

## 2016-04-22 ENCOUNTER — Other Ambulatory Visit: Payer: Self-pay | Admitting: Physician Assistant

## 2016-04-22 ENCOUNTER — Telehealth: Payer: Self-pay | Admitting: Physician Assistant

## 2016-04-22 MED ORDER — PRAVASTATIN SODIUM 20 MG PO TABS: 20.0000 mg | ORAL_TABLET | Freq: Every day | ORAL | Status: DC

## 2016-04-22 NOTE — Telephone Encounter (Signed)
Relation to [id number] Call back number:[phone] Pharmacy:  Reason for call:  Patient last seen 04/01/2016 and states PCP advised her to stop taking cholesterol medication and she would like to know when she should start taking medication again. Please advise

## 2016-04-23 ENCOUNTER — Encounter (HOSPITAL_COMMUNITY): Payer: Self-pay | Admitting: *Deleted

## 2016-04-23 ENCOUNTER — Inpatient Hospital Stay (HOSPITAL_COMMUNITY): Payer: BLUE CROSS/BLUE SHIELD | Admitting: Anesthesiology

## 2016-04-23 ENCOUNTER — Inpatient Hospital Stay (HOSPITAL_COMMUNITY)
Admission: RE | Admit: 2016-04-23 | Discharge: 2016-04-25 | DRG: 470 | Disposition: A | Discharge status: Home Health | Payer: BLUE CROSS/BLUE SHIELD | Source: Ambulatory Visit | Attending: Orthopedic Surgery | Admitting: Orthopedic Surgery

## 2016-04-23 ENCOUNTER — Encounter (HOSPITAL_COMMUNITY)
Admission: RE | Disposition: A | Discharge status: Home Health | Payer: Self-pay | Source: Ambulatory Visit | Attending: Orthopedic Surgery

## 2016-04-23 DIAGNOSIS — Z79899 Other long term (current) drug therapy: Secondary | ICD-10-CM | POA: Diagnosis not present

## 2016-04-23 DIAGNOSIS — M170 Bilateral primary osteoarthritis of knee: Secondary | ICD-10-CM | POA: Diagnosis not present

## 2016-04-23 DIAGNOSIS — M1712 Unilateral primary osteoarthritis, left knee: Secondary | ICD-10-CM | POA: Diagnosis not present

## 2016-04-23 DIAGNOSIS — M21062 Valgus deformity, not elsewhere classified, left knee: Secondary | ICD-10-CM | POA: Diagnosis present

## 2016-04-23 DIAGNOSIS — M24562 Contracture, left knee: Secondary | ICD-10-CM | POA: Diagnosis present

## 2016-04-23 DIAGNOSIS — Z90710 Acquired absence of both cervix and uterus: Secondary | ICD-10-CM | POA: Diagnosis not present

## 2016-04-23 DIAGNOSIS — E785 Hyperlipidemia, unspecified: Secondary | ICD-10-CM | POA: Diagnosis not present

## 2016-04-23 DIAGNOSIS — M179 Osteoarthritis of knee, unspecified: Secondary | ICD-10-CM | POA: Diagnosis not present

## 2016-04-23 DIAGNOSIS — Z96652 Presence of left artificial knee joint: Secondary | ICD-10-CM

## 2016-04-23 DIAGNOSIS — M25562 Pain in left knee: Secondary | ICD-10-CM | POA: Diagnosis not present

## 2016-04-23 HISTORY — PX: TOTAL KNEE ARTHROPLASTY: SHX125

## 2016-04-23 LAB — TYPE AND SCREEN
ABO/RH(D): O POS
Antibody Screen: NEGATIVE

## 2016-04-23 SURGERY — ARTHROPLASTY, KNEE, TOTAL
Anesthesia: General | Site: Knee | Laterality: Left

## 2016-04-23 MED ORDER — SODIUM CHLORIDE 0.9 % IR SOLN
Status: AC
  Filled 2016-04-23: qty 1

## 2016-04-23 MED ORDER — BUPIVACAINE LIPOSOME 1.3 % IJ SUSP
INTRAMUSCULAR | Status: DC | PRN
  Administered 2016-04-23: 20 mL

## 2016-04-23 MED ORDER — POLYETHYLENE GLYCOL 3350 17 G PO PACK: 17.0000 g | PACK | Freq: Every day | ORAL | Status: DC | PRN

## 2016-04-23 MED ORDER — ONDANSETRON HCL 4 MG/2ML IJ SOLN
INTRAMUSCULAR | Status: DC | PRN
  Administered 2016-04-23: 4 mg via INTRAVENOUS

## 2016-04-23 MED ORDER — HYDROMORPHONE HCL 1 MG/ML IJ SOLN
0.2500 mg | INTRAMUSCULAR | Status: DC | PRN
  Administered 2016-04-23 (×3): 0.5 mg via INTRAVENOUS

## 2016-04-23 MED ORDER — BUPIVACAINE-EPINEPHRINE 0.25% -1:200000 IJ SOLN
INTRAMUSCULAR | Status: AC
  Filled 2016-04-23: qty 1

## 2016-04-23 MED ORDER — ACETAMINOPHEN 325 MG PO TABS
650.0000 mg | ORAL_TABLET | Freq: Four times a day (QID) | ORAL | Status: DC | PRN
  Administered 2016-04-24 – 2016-04-25 (×2): 650 mg via ORAL
  Filled 2016-04-23 (×2): qty 2

## 2016-04-23 MED ORDER — FENTANYL CITRATE (PF) 250 MCG/5ML IJ SOLN
INTRAMUSCULAR | Status: AC
  Filled 2016-04-23: qty 5

## 2016-04-23 MED ORDER — FENTANYL CITRATE (PF) 100 MCG/2ML IJ SOLN
INTRAMUSCULAR | Status: DC | PRN
  Administered 2016-04-23 (×5): 25 ug via INTRAVENOUS
  Administered 2016-04-23: 100 ug via INTRAVENOUS
  Administered 2016-04-23: 25 ug via INTRAVENOUS

## 2016-04-23 MED ORDER — ONDANSETRON HCL 4 MG PO TABS: 4.0000 mg | ORAL_TABLET | Freq: Four times a day (QID) | ORAL | Status: DC | PRN

## 2016-04-23 MED ORDER — ALUM & MAG HYDROXIDE-SIMETH 200-200-20 MG/5ML PO SUSP: 30.0000 mL | ORAL | Status: DC | PRN

## 2016-04-23 MED ORDER — PHENOL 1.4 % MT LIQD
1.0000 | OROMUCOSAL | Status: DC | PRN
  Filled 2016-04-23: qty 177

## 2016-04-23 MED ORDER — ACETAMINOPHEN 325 MG PO TABS: 325.0000 mg | ORAL_TABLET | ORAL | Status: DC | PRN

## 2016-04-23 MED ORDER — LIDOCAINE HCL (CARDIAC) 20 MG/ML IV SOLN
INTRAVENOUS | Status: AC
  Filled 2016-04-23: qty 5

## 2016-04-23 MED ORDER — ACETAMINOPHEN 160 MG/5ML PO SOLN: 325.0000 mg | ORAL | Status: DC | PRN

## 2016-04-23 MED ORDER — CELECOXIB 200 MG PO CAPS
200.0000 mg | ORAL_CAPSULE | Freq: Two times a day (BID) | ORAL | Status: DC
  Administered 2016-04-23 – 2016-04-25 (×3): 200 mg via ORAL
  Filled 2016-04-23 (×4): qty 1

## 2016-04-23 MED ORDER — BISACODYL 5 MG PO TBEC
5.0000 mg | DELAYED_RELEASE_TABLET | Freq: Every day | ORAL | Status: DC | PRN
  Administered 2016-04-25: 5 mg via ORAL
  Filled 2016-04-23: qty 1

## 2016-04-23 MED ORDER — THROMBIN 5000 UNITS EX SOLR
CUTANEOUS | Status: AC
  Filled 2016-04-23: qty 5000

## 2016-04-23 MED ORDER — HYDROMORPHONE HCL 1 MG/ML IJ SOLN
INTRAMUSCULAR | Status: AC
  Filled 2016-04-23: qty 1

## 2016-04-23 MED ORDER — DEXAMETHASONE SODIUM PHOSPHATE 10 MG/ML IJ SOLN
INTRAMUSCULAR | Status: DC | PRN
  Administered 2016-04-23: 10 mg via INTRAVENOUS

## 2016-04-23 MED ORDER — CHLORHEXIDINE GLUCONATE 4 % EX LIQD: 60.0000 mL | Freq: Once | CUTANEOUS | Status: DC

## 2016-04-23 MED ORDER — HYDROCODONE-ACETAMINOPHEN 5-325 MG PO TABS
1.0000 | ORAL_TABLET | ORAL | Status: DC | PRN
  Administered 2016-04-23 (×2): 1 via ORAL
  Administered 2016-04-24 – 2016-04-25 (×5): 2 via ORAL
  Filled 2016-04-23: qty 2
  Filled 2016-04-23 (×2): qty 1
  Filled 2016-04-23 (×4): qty 2

## 2016-04-23 MED ORDER — ONDANSETRON HCL 4 MG/2ML IJ SOLN
4.0000 mg | Freq: Four times a day (QID) | INTRAMUSCULAR | Status: DC | PRN
  Administered 2016-04-24 (×2): 4 mg via INTRAVENOUS
  Filled 2016-04-23 (×2): qty 2

## 2016-04-23 MED ORDER — OXYCODONE-ACETAMINOPHEN 5-325 MG PO TABS: 2.0000 | ORAL_TABLET | ORAL | Status: DC | PRN

## 2016-04-23 MED ORDER — FLEET ENEMA 7-19 GM/118ML RE ENEM: 1.0000 | ENEMA | Freq: Once | RECTAL | Status: DC | PRN

## 2016-04-23 MED ORDER — BUPIVACAINE HCL 0.25 % IJ SOLN
INTRAMUSCULAR | Status: DC | PRN
  Administered 2016-04-23: 20 mL

## 2016-04-23 MED ORDER — SODIUM CHLORIDE 0.9 % IJ SOLN
INTRAMUSCULAR | Status: AC
  Filled 2016-04-23: qty 50

## 2016-04-23 MED ORDER — PROPOFOL 10 MG/ML IV BOLUS
INTRAVENOUS | Status: DC | PRN
  Administered 2016-04-23: 50 mg via INTRAVENOUS

## 2016-04-23 MED ORDER — CEFAZOLIN SODIUM-DEXTROSE 2-4 GM/100ML-% IV SOLN
2.0000 g | INTRAVENOUS | Status: AC
  Administered 2016-04-23: 2 g via INTRAVENOUS

## 2016-04-23 MED ORDER — ONDANSETRON HCL 4 MG/2ML IJ SOLN
INTRAMUSCULAR | Status: AC
  Filled 2016-04-23: qty 2

## 2016-04-23 MED ORDER — PROPOFOL 10 MG/ML IV BOLUS
INTRAVENOUS | Status: AC
  Filled 2016-04-23: qty 20

## 2016-04-23 MED ORDER — OXYCODONE HCL 5 MG/5ML PO SOLN: 5.0000 mg | Freq: Once | ORAL | Status: DC | PRN

## 2016-04-23 MED ORDER — EPHEDRINE SULFATE 50 MG/ML IJ SOLN
INTRAMUSCULAR | Status: DC | PRN
  Administered 2016-04-23: 5 mg via INTRAVENOUS
  Administered 2016-04-23: 2.5 mg via INTRAVENOUS

## 2016-04-23 MED ORDER — SODIUM CHLORIDE 0.9 % IR SOLN
Status: DC | PRN
  Administered 2016-04-23: 500 mL

## 2016-04-23 MED ORDER — TRANEXAMIC ACID 1000 MG/10ML IV SOLN
1000.0000 mg | INTRAVENOUS | Status: AC
  Administered 2016-04-23: 1000 mg via INTRAVENOUS
  Filled 2016-04-23 (×2): qty 10

## 2016-04-23 MED ORDER — OXYCODONE HCL 5 MG PO TABS: 5.0000 mg | ORAL_TABLET | Freq: Once | ORAL | Status: DC | PRN

## 2016-04-23 MED ORDER — PROPOFOL 500 MG/50ML IV EMUL
INTRAVENOUS | Status: DC | PRN
  Administered 2016-04-23: 25 ug/kg/min via INTRAVENOUS

## 2016-04-23 MED ORDER — CEFAZOLIN SODIUM-DEXTROSE 2-4 GM/100ML-% IV SOLN
INTRAVENOUS | Status: AC
  Filled 2016-04-23: qty 100

## 2016-04-23 MED ORDER — DIPHENHYDRAMINE HCL 25 MG PO CAPS
25.0000 mg | ORAL_CAPSULE | Freq: Four times a day (QID) | ORAL | Status: DC | PRN
  Administered 2016-04-24: 25 mg via ORAL
  Filled 2016-04-23: qty 1

## 2016-04-23 MED ORDER — LACTATED RINGERS IV SOLN
INTRAVENOUS | Status: DC
  Administered 2016-04-23 (×2): via INTRAVENOUS
  Administered 2016-04-23: 1000 mL via INTRAVENOUS

## 2016-04-23 MED ORDER — HYDROMORPHONE HCL 1 MG/ML IJ SOLN
1.0000 mg | INTRAMUSCULAR | Status: DC | PRN
  Administered 2016-04-23 – 2016-04-24 (×4): 1 mg via INTRAVENOUS
  Filled 2016-04-23 (×4): qty 1

## 2016-04-23 MED ORDER — METHOCARBAMOL 1000 MG/10ML IJ SOLN
500.0000 mg | Freq: Four times a day (QID) | INTRAVENOUS | Status: DC | PRN
  Administered 2016-04-23: 500 mg via INTRAVENOUS
  Filled 2016-04-23: qty 5
  Filled 2016-04-23: qty 550

## 2016-04-23 MED ORDER — FERROUS SULFATE 325 (65 FE) MG PO TABS
325.0000 mg | ORAL_TABLET | Freq: Three times a day (TID) | ORAL | Status: DC
  Administered 2016-04-23 – 2016-04-25 (×4): 325 mg via ORAL
  Filled 2016-04-23 (×4): qty 1

## 2016-04-23 MED ORDER — CEFAZOLIN IN D5W 1 GM/50ML IV SOLN
1.0000 g | Freq: Four times a day (QID) | INTRAVENOUS | Status: AC
  Administered 2016-04-23 – 2016-04-24 (×2): 1 g via INTRAVENOUS
  Filled 2016-04-23 (×2): qty 50

## 2016-04-23 MED ORDER — LACTATED RINGERS IV SOLN
INTRAVENOUS | Status: DC
  Administered 2016-04-23: 1000 mL via INTRAVENOUS
  Administered 2016-04-24: 02:00:00 via INTRAVENOUS

## 2016-04-23 MED ORDER — BUPIVACAINE HCL (PF) 0.25 % IJ SOLN: INTRAMUSCULAR | Status: DC | PRN

## 2016-04-23 MED ORDER — METOPROLOL TARTRATE 50 MG PO TABS
50.0000 mg | ORAL_TABLET | Freq: Once | ORAL | Status: AC
  Administered 2016-04-23: 50 mg via ORAL
  Filled 2016-04-23: qty 1

## 2016-04-23 MED ORDER — METHOCARBAMOL 500 MG PO TABS
500.0000 mg | ORAL_TABLET | Freq: Four times a day (QID) | ORAL | Status: DC | PRN
  Administered 2016-04-24 (×2): 500 mg via ORAL
  Filled 2016-04-23 (×3): qty 1

## 2016-04-23 MED ORDER — SODIUM CHLORIDE 0.9 % IJ SOLN
INTRAMUSCULAR | Status: DC | PRN
  Administered 2016-04-23: 20 mL

## 2016-04-23 MED ORDER — MIDAZOLAM HCL 5 MG/5ML IJ SOLN
INTRAMUSCULAR | Status: DC | PRN
  Administered 2016-04-23: 1 mg via INTRAVENOUS

## 2016-04-23 MED ORDER — MENTHOL 3 MG MT LOZG: 1.0000 | LOZENGE | OROMUCOSAL | Status: DC | PRN

## 2016-04-23 MED ORDER — BUPIVACAINE IN DEXTROSE 0.75-8.25 % IT SOLN
INTRATHECAL | Status: DC | PRN
  Administered 2016-04-23: 1.8 mL via INTRATHECAL

## 2016-04-23 MED ORDER — RIVAROXABAN 10 MG PO TABS
10.0000 mg | ORAL_TABLET | Freq: Every day | ORAL | Status: DC
Start: 2016-04-24 — End: 2016-04-25
  Administered 2016-04-24 – 2016-04-25 (×2): 10 mg via ORAL
  Filled 2016-04-23 (×2): qty 1

## 2016-04-23 MED ORDER — DEXAMETHASONE SODIUM PHOSPHATE 10 MG/ML IJ SOLN
INTRAMUSCULAR | Status: AC
  Filled 2016-04-23: qty 1

## 2016-04-23 MED ORDER — BUPIVACAINE LIPOSOME 1.3 % IJ SUSP
20.0000 mL | Freq: Once | INTRAMUSCULAR | Status: DC
  Filled 2016-04-23: qty 20

## 2016-04-23 MED ORDER — MIDAZOLAM HCL 2 MG/2ML IJ SOLN
INTRAMUSCULAR | Status: AC
  Filled 2016-04-23: qty 2

## 2016-04-23 MED ORDER — SODIUM CHLORIDE 0.9 % IR SOLN
Status: DC | PRN
  Administered 2016-04-23: 1000 mL

## 2016-04-23 MED ORDER — PRAVASTATIN SODIUM 20 MG PO TABS
20.0000 mg | ORAL_TABLET | Freq: Every day | ORAL | Status: DC
  Administered 2016-04-23: 20 mg via ORAL
  Filled 2016-04-23 (×2): qty 1

## 2016-04-23 MED ORDER — ACETAMINOPHEN 650 MG RE SUPP: 650.0000 mg | Freq: Four times a day (QID) | RECTAL | Status: DC | PRN

## 2016-04-23 SURGICAL SUPPLY — 60 items
BAG DECANTER FOR FLEXI CONT (MISCELLANEOUS) IMPLANT
BAG ZIPLOCK 12X15 (MISCELLANEOUS) ×2 IMPLANT
BANDAGE ACE 4X5 VEL STRL LF (GAUZE/BANDAGES/DRESSINGS) ×2 IMPLANT
BANDAGE ACE 6X5 VEL STRL LF (GAUZE/BANDAGES/DRESSINGS) ×2 IMPLANT
BLADE SAG 18X100X1.27 (BLADE) ×2 IMPLANT
BLADE SAW SGTL 11.0X1.19X90.0M (BLADE) ×2 IMPLANT
BNDG COHESIVE 4X5 TAN NS LF (GAUZE/BANDAGES/DRESSINGS) IMPLANT
BNDG GAUZE ELAST 4 BULKY (GAUZE/BANDAGES/DRESSINGS) ×2 IMPLANT
BONE CEMENT GENTAMICIN (Cement) ×4 IMPLANT
CAP KNEE TOTAL 3 SIGMA ×2 IMPLANT
CEMENT BONE GENTAMICIN 40 (Cement) ×2 IMPLANT
CLOTH BEACON ORANGE TIMEOUT ST (SAFETY) ×2 IMPLANT
CUFF TOURN SGL QUICK 34 (TOURNIQUET CUFF) ×1
CUFF TRNQT CYL 34X4X40X1 (TOURNIQUET CUFF) ×1 IMPLANT
DECANTER SPIKE VIAL GLASS SM (MISCELLANEOUS) ×4 IMPLANT
DRAPE INCISE IOBAN 66X45 STRL (DRAPES) IMPLANT
DRAPE U-SHAPE 47X51 STRL (DRAPES) ×2 IMPLANT
DRSG ADAPTIC 3X8 NADH LF (GAUZE/BANDAGES/DRESSINGS) ×2 IMPLANT
DRSG PAD ABDOMINAL 8X10 ST (GAUZE/BANDAGES/DRESSINGS) ×4 IMPLANT
DURAPREP 26ML APPLICATOR (WOUND CARE) ×2 IMPLANT
ELECT REM PT RETURN 9FT ADLT (ELECTROSURGICAL) ×2
ELECTRODE REM PT RTRN 9FT ADLT (ELECTROSURGICAL) ×1 IMPLANT
EVACUATOR 1/8 PVC DRAIN (DRAIN) ×2 IMPLANT
GAUZE SPONGE 4X4 12PLY STRL (GAUZE/BANDAGES/DRESSINGS) ×2 IMPLANT
GLOVE BIOGEL PI IND STRL 6.5 (GLOVE) ×1 IMPLANT
GLOVE BIOGEL PI IND STRL 8 (GLOVE) ×1 IMPLANT
GLOVE BIOGEL PI INDICATOR 6.5 (GLOVE) ×1
GLOVE BIOGEL PI INDICATOR 8 (GLOVE) ×1
GLOVE ECLIPSE 8.0 STRL XLNG CF (GLOVE) ×4 IMPLANT
GLOVE SURG SS PI 6.5 STRL IVOR (GLOVE) ×2 IMPLANT
GOWN STRL REUS W/TWL LRG LVL3 (GOWN DISPOSABLE) ×2 IMPLANT
GOWN STRL REUS W/TWL XL LVL3 (GOWN DISPOSABLE) ×2 IMPLANT
HANDPIECE INTERPULSE COAX TIP (DISPOSABLE) ×1
HEMOSTAT SPONGE AVITENE ULTRA (HEMOSTASIS) ×2 IMPLANT
IMMOBILIZER KNEE 20 (SOFTGOODS) ×2 IMPLANT
MANIFOLD NEPTUNE II (INSTRUMENTS) ×2 IMPLANT
NEEDLE HYPO 21X1.5 SAFETY (NEEDLE) IMPLANT
NEEDLE HYPO 22GX1.5 SAFETY (NEEDLE) IMPLANT
NS IRRIG 1000ML POUR BTL (IV SOLUTION) IMPLANT
PACK TOTAL KNEE CUSTOM (KITS) ×2 IMPLANT
PAD ABD 8X10 STRL (GAUZE/BANDAGES/DRESSINGS) ×4 IMPLANT
PENCIL SMOKE EVAC W/HOLSTER (ELECTROSURGICAL) ×2 IMPLANT
POSITIONER SURGICAL ARM (MISCELLANEOUS) ×2 IMPLANT
SET HNDPC FAN SPRY TIP SCT (DISPOSABLE) ×1 IMPLANT
SET PAD KNEE POSITIONER (MISCELLANEOUS) ×2 IMPLANT
SPONGE LAP 18X18 X RAY DECT (DISPOSABLE) IMPLANT
STRIP CLOSURE SKIN 1/2X4 (GAUZE/BANDAGES/DRESSINGS) ×4 IMPLANT
SUT BONE WAX W31G (SUTURE) ×4 IMPLANT
SUT MNCRL AB 4-0 PS2 18 (SUTURE) ×2 IMPLANT
SUT VIC AB 1 CT1 27 (SUTURE) ×2
SUT VIC AB 1 CT1 27XBRD ANTBC (SUTURE) ×2 IMPLANT
SUT VIC AB 2-0 CT1 27 (SUTURE) ×3
SUT VIC AB 2-0 CT1 TAPERPNT 27 (SUTURE) ×3 IMPLANT
SUT VLOC 180 0 24IN GS25 (SUTURE) ×2 IMPLANT
SYR 20CC LL (SYRINGE) ×4 IMPLANT
TOWER CARTRIDGE SMART MIX (DISPOSABLE) ×2 IMPLANT
TRAY FOLEY W/METER SILVER 14FR (SET/KITS/TRAYS/PACK) ×2 IMPLANT
WATER STERILE IRR 1000ML POUR (IV SOLUTION) ×2 IMPLANT
WRAP KNEE MAXI GEL POST OP (GAUZE/BANDAGES/DRESSINGS) ×2 IMPLANT
YANKAUER SUCT BULB TIP 10FT TU (MISCELLANEOUS) ×2 IMPLANT

## 2016-04-23 NOTE — Anesthesia Postprocedure Evaluation (Signed)
Anesthesia Post Note  Patient: Melanie Lee  Procedure(s) Performed: Procedure(s) (LRB): TOTAL KNEE ARTHROPLASTY (Left)  Patient location during evaluation: PACU Anesthesia Type: General and Spinal Level of consciousness: awake Pain management: pain level controlled Vital Signs Assessment: post-procedure vital signs reviewed and stable Respiratory status: spontaneous breathing Cardiovascular status: stable Postop Assessment: no signs of nausea or vomiting Anesthetic complications: no    Last Vitals:  Filed Vitals:   04/23/16 1600 04/23/16 1616  BP: 157/92 172/80  Pulse: 76   Temp: 36.4 C 37.7 C  Resp: 14 13    Last Pain:  Filed Vitals:   04/23/16 1621  PainSc: Asleep                 [name]

## 2016-04-23 NOTE — Interval H&P Note (Signed)
History and Physical Interval Note:  04/23/2016 12:19 PM  Melanie Lee  has presented today for surgery, with the diagnosis of LEFT KNEE OA  The various methods of treatment have been discussed with the patient and family. After consideration of risks, benefits and other options for treatment, the patient has consented to  Procedure(s): TOTAL KNEE ARTHROPLASTY (Left) as a surgical intervention .  The patient's history has been reviewed, patient examined, no change in status, stable for surgery.  I have reviewed the patient's chart and labs.  Questions were answered to the patient's satisfaction.     Mattilyn Crites A

## 2016-04-23 NOTE — Transfer of Care (Signed)
Immediate Anesthesia Transfer of Care Note  Patient: Melanie Lee  Procedure(s) Performed: Procedure(s): TOTAL KNEE ARTHROPLASTY (Left)  Patient Location: PACU  Anesthesia Type:General and Spinal  Level of Consciousness:  sedated, patient cooperative and responds to stimulation  Airway & Oxygen Therapy:Patient Spontanous Breathing and Patient connected to face mask oxgen  Post-op Assessment:  Report given to PACU RN and Post -op Vital signs reviewed and stable  Post vital signs:  Reviewed and stable  Last Vitals:  Filed Vitals:   04/23/16 0945  BP: 132/92  Pulse: 74  Temp: 36.4 C  Resp: 18    Complications: No apparent anesthesia complications

## 2016-04-23 NOTE — Anesthesia Procedure Notes (Addendum)
Spinal Patient location during procedure: OR Start time: 04/23/2016 12:34 PM End time: 04/23/2016 12:39 PM Reason for block: at surgeon's request Staffing Resident/CRNA: Christell Faith L Performed by: resident/CRNA  Preanesthetic Checklist Completed: patient identified, site marked, surgical consent, pre-op evaluation, timeout performed, IV checked, risks and benefits discussed, monitors and equipment checked and at surgeon's request Spinal Block Patient position: sitting Prep: Betadine Patient monitoring: heart rate, continuous pulse ox and blood pressure Approach: midline Location: L3-4 Injection technique: single-shot Needle Needle type: Pencan  Needle gauge: 25 G Needle length: 9 cm Assessment Sensory level: T4 Additional Notes Expiration of kit checked and confirmed. Patient tolerated procedure well,without complications  with noted clear CSF pre,mid and post injection. Loss of motor and sensory on exam post injection.  Procedure Name: LMA Insertion Date/Time: 04/23/2016 1:11 PM Performed by: Christell Faith L Pre-anesthesia Checklist: Patient identified, Emergency Drugs available, Suction available, Patient being monitored and Timeout performed Patient Re-evaluated:Patient Re-evaluated prior to inductionOxygen Delivery Method: Circle system utilized Preoxygenation: Pre-oxygenation with 100% oxygen Intubation Type: IV induction Ventilation: Mask ventilation without difficulty LMA: LMA inserted LMA Size: 4.0 Number of attempts: 1 Tube secured with: Tape Dental Injury: Teeth and Oropharynx as per pre-operative assessment

## 2016-04-23 NOTE — Addendum Note (Signed)
Addendum  created 04/23/16 1911 by West Pugh, CRNA   Modules edited: Anesthesia Events

## 2016-04-23 NOTE — Brief Op Note (Signed)
04/23/2016  2:34 PM  PATIENT:  Melanie Lee  70 y.o. 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  POST-OPERATIVE DIAGNOSIS:  Same as Pre-Op  PROCEDURE:  Procedure(s): TOTAL KNEE ARTHROPLASTY (Left) and Release of Flexion Contractures.  SURGEON:  Surgeon(s) and Role:    * Latanya Maudlin, MD - Primary  PHYSICIAN ASSISTANT: Ardeen Jourdain PA  ASSISTANTS: Ardeen Jourdain PA   ANESTHESIA:   spinal  EBL:  Total I/O In: 1000 [I.V.:1000] Out: 750 [Urine:700; Blood:50]  BLOOD ADMINISTERED:none  DRAINS: (One) Hemovact drain(s) in the Left Knee with  Suction Open   LOCAL MEDICATIONS USED:  MARCAINE 20cc of 0.25% with Epinephrine. And Exparel 20cc mixed with 20cc of Normal Saline.    SPECIMEN:  No Specimen  DISPOSITION OF SPECIMEN:  N/A  COUNTS:  YES  TOURNIQUET:  * Missing tourniquet times found for documented tourniquets in log:  [id number] *  DICTATION: .Other Dictation: Dictation Number [id number]  PLAN OF CARE: Admit to inpatient   PATIENT DISPOSITION:  Stable in OR   Delay start of Pharmacological VTE agent (>24hrs) due to surgical blood loss or risk of bleeding: yes

## 2016-04-23 NOTE — Telephone Encounter (Signed)
Discussed lab results and need for statin. Current medication caused headaches which resolved after holding the medication for a couple of weeks. Rx Pravachol 20 mg sent in for her to start taking. FU discussed.

## 2016-04-23 NOTE — Anesthesia Preprocedure Evaluation (Signed)
Anesthesia Evaluation  Patient identified by MRN, date of birth, ID band Patient awake    Reviewed: Allergy & Precautions, NPO status , Patient's Chart, lab work & pertinent test results  History of Anesthesia Complications Negative for: history of anesthetic complications  Airway Mallampati: II  TM Distance: >3 FB Neck ROM: Full    Dental  (+) Missing, Dental Advisory Given, Poor Dentition   Pulmonary neg pulmonary ROS,    breath sounds clear to auscultation       Cardiovascular negative cardio ROS   Rhythm:Regular     Neuro/Psych  Headaches, negative psych ROS   GI/Hepatic negative GI ROS, Neg liver ROS,   Endo/Other  negative endocrine ROS  Renal/GU negative Renal ROS     Musculoskeletal  (+) Arthritis ,   Abdominal   Peds  Hematology negative hematology ROS (+)   Anesthesia Other Findings   Reproductive/Obstetrics                             Anesthesia Physical Anesthesia Plan  ASA: II  Anesthesia Plan: Spinal   Post-op Pain Management:    Induction: Intravenous  Airway Management Planned: Nasal Cannula, Natural Airway and Simple Face Mask  Additional Equipment: None  Intra-op Plan:   Post-operative Plan:   Informed Consent: I have reviewed the patients History and Physical, chart, labs and discussed the procedure including the risks, benefits and alternatives for the proposed anesthesia with the patient or authorized representative who has indicated his/her understanding and acceptance.   Dental advisory given  Plan Discussed with: CRNA and Surgeon  Anesthesia Plan Comments:         Anesthesia Quick Evaluation

## 2016-04-23 NOTE — H&P (Signed)
TOTAL KNEE ADMISSION H&P  Patient is being admitted for left total knee arthroplasty.  Subjective:  Chief Complaint:left knee pain.  HPI: Melanie Lee, 70 y.o. female, has a history of pain and functional disability in the left knee due to arthritis and has failed non-surgical conservative treatments for greater than 12 weeks to includeNSAID's and/or analgesics, corticosteriod injections and activity modification.  Onset of symptoms was gradual, starting >10 years ago with gradually worsening course since that time. The patient noted prior procedures on the knee to include  arthroscopy and menisectomy on the left knee(s).  Patient currently rates pain in the left knee(s) at 8 out of 10 with activity. Patient has night pain, worsening of pain with activity and weight bearing, pain that interferes with activities of daily living, pain with passive range of motion, crepitus and joint swelling.  Patient has evidence of periarticular osteophytes and joint space narrowing by imaging studies. There is no active infection.  Patient Active Problem List   Diagnosis Date Noted  . Preoperative clearance 04/01/2016  . Hyperlipidemia 04/01/2016  . Screening for ischemic heart disease 01/01/2016  . Need for pneumococcal vaccination 01/01/2016  . Other fatigue 03/21/2015  . Visit for preventive health examination 10/28/2014  . Primary osteoarthritis of both knees 10/28/2014  . Colon cancer screening 10/28/2014  . Osteoporosis screening 10/28/2014   Past Medical History  Diagnosis Date  . Elevated blood pressure (not hypertension)     "elevates sometimes with MD visits"  . 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 Frequent headaches     04-19-16 "no longer bothered"- since no cholesterol med     Past Surgical History  Procedure Laterality Date  . Breast biopsy  1995  . Knee surgery      Left Arthroscopy  . Abdominal hysterectomy  1995  . Cataract extraction   2013 & 2015    Bilateral     Current outpatient prescriptions:  .  acetaminophen (TYLENOL) 325 MG tablet, Take 650 mg by mouth every 6 (six) hours as needed for mild pain. , Disp: , Rfl:  .  Calcium Carbonate-Vitamin D (CALCIUM-VITAMIN D) 500-200 MG-UNIT tablet, Take 1 tablet by mouth daily., Disp: , Rfl:  .  Multiple Vitamins-Minerals (MULTIVITAMIN ADULTS 50+) TABS, Take 1 tablet by mouth daily. , Disp: , Rfl:  .  pravastatin (PRAVACHOL) 20 MG tablet, Take 1 tablet (20 mg total) by mouth daily., Disp: 30 tablet, Rfl: 0  Allergies  Allergen Reactions  . Tramadol Nausea Only and Other (See Comments)    Dizziness    Social History  Substance Use Topics  . Smoking status: Never Smoker   . Smokeless tobacco: Never Used  . Alcohol Use: 0.0 oz/week    0 Standard drinks or equivalent per week     Comment: rare    Family History  Problem Relation Age of Onset  . Breast cancer Mother 88    Deceased  . Heart disease Mother   . Hypertension Mother   . Hypertension Father     Deceased  . Stroke Father   . Hypertension Sister   . Hypertension Brother   . Heart defect Daughter      Review of Systems  Constitutional: Negative.   HENT: Negative.   Eyes: Negative.   Respiratory: Negative.   Cardiovascular: Negative.   Gastrointestinal: Negative.   Genitourinary: Negative.   Musculoskeletal: Positive for myalgias and joint pain. Negative for back pain, falls and  neck pain.  Skin: Negative.   Neurological: Negative.   Endo/Heme/Allergies: Negative.   Psychiatric/Behavioral: Negative.     Objective:  Physical Exam  Constitutional: She is oriented to person, place, and time. She appears well-developed and well-nourished. No distress.  HENT:  Head: Normocephalic and atraumatic.  Right Ear: External ear normal.  Left Ear: External ear normal.  Nose: Nose normal.  Mouth/Throat: Oropharynx is clear and moist.  Eyes: Conjunctivae and EOM are normal.  Neck: Normal range of  motion. Neck supple.  Cardiovascular: Normal rate, regular rhythm, normal heart sounds and intact distal pulses.   No murmur heard. Respiratory: Effort normal and breath sounds normal. No respiratory distress. She has no wheezes.  GI: Soft. Bowel sounds are normal. She exhibits no distension. There is no tenderness.  Musculoskeletal:       Right hip: Normal.       Left hip: Normal.       Right knee: She exhibits decreased range of motion and swelling. She exhibits no effusion and no erythema. Tenderness found. Lateral joint line tenderness noted. No medial joint line tenderness noted.       Left knee: She exhibits decreased range of motion and swelling. She exhibits no effusion and no erythema. Tenderness found. Medial joint line and lateral joint line tenderness noted.  Neurological: She is alert and oriented to person, place, and time. She has normal strength and normal reflexes. No sensory deficit.  Skin: No rash noted. She is not diaphoretic. No erythema.  Psychiatric: She has a normal mood and affect. Her behavior is normal.    Vitals  Weight: 163 lb Height: 63in Body Surface Area: 1.77 m Body Mass Index: 28.87 kg/m  Pulse: 76 (Regular)  BP: 122/74 (Sitting, Left Arm, Standard)  Imaging Review Plain radiographs demonstrate severe degenerative joint disease of the left knee(s). The overall alignment ismild valgus. The bone quality appears to be good for age and reported activity level.  Assessment/Plan:  End stage primary osteoarthritis, left knee   The patient history, physical examination, clinical judgment of the provider and imaging studies are consistent with end stage degenerative joint disease of the left knee(s) and total knee arthroplasty is deemed medically necessary. The treatment options including medical management, injection therapy arthroscopy and arthroplasty were discussed at length. The risks and benefits of total knee arthroplasty were presented and  reviewed. The risks due to aseptic loosening, infection, stiffness, patella tracking problems, thromboembolic complications and other imponderables were discussed. The patient acknowledged the explanation, agreed to proceed with the plan and consent was signed. Patient is being admitted for inpatient treatment for surgery, pain control, PT, OT, prophylactic antibiotics, VTE prophylaxis, progressive ambulation and ADL's and discharge planning. The patient is planning to be discharged home with home health services    PCP: Raiford Noble, PA-C   Ardeen Jourdain, [place]

## 2016-04-24 LAB — CBC
HCT: 39.8 % (ref 36.0–46.0)
Hemoglobin: 13.1 g/dL (ref 12.0–15.0)
MCH: 29 pg (ref 26.0–34.0)
MCHC: 32.9 g/dL (ref 30.0–36.0)
MCV: 88.1 fL (ref 78.0–100.0)
Platelets: 328 10*3/uL (ref 150–400)
RBC: 4.52 MIL/uL (ref 3.87–5.11)
RDW: 12.2 % (ref 11.5–15.5)
WBC: 19.3 10*3/uL — ABNORMAL HIGH (ref 4.0–10.5)

## 2016-04-24 LAB — BASIC METABOLIC PANEL
Anion gap: 9 (ref 5–15)
BUN: 9 mg/dL (ref 6–20)
CO2: 26 mmol/L (ref 22–32)
Calcium: 9.2 mg/dL (ref 8.9–10.3)
Chloride: 100 mmol/L — ABNORMAL LOW (ref 101–111)
Creatinine, Ser: 0.51 mg/dL (ref 0.44–1.00)
GFR calc Af Amer: >60 mL/min (ref >60)
GFR calc non Af Amer: >60 mL/min (ref >60)
Glucose, Bld: 135 mg/dL — ABNORMAL HIGH (ref 65–99)
Potassium: 3.9 mmol/L (ref 3.5–5.1)
Sodium: 135 mmol/L (ref 135–145)

## 2016-04-24 NOTE — Op Note (Signed)
NAMEELIZET, SOLLITTO NO.:  [id number]  MEDICAL RECORD NO.:  [id number]  LOCATION:  [id number]                         FACILITY:  [facility]  PHYSICIAN:  Kipp Brood. Cap Massi, M.D.DATE OF BIRTH:  10-Feb-1946  DATE OF PROCEDURE:  04/23/2016 DATE OF DISCHARGE:                              OPERATIVE REPORT   SURGEON:  Kipp Brood. Gladstone Lighter, M.D.  ASSISTANT:  Ardeen Jourdain, [place].  PREOPERATIVE DIAGNOSES: 1. Severe valgus deformity left knee. 2. Severe flexion contractures, left knee. 3. Severe primary osteoarthritis with bone-on-bone, left knee.  POSTOPERATIVE DIAGNOSES: 1. Severe valgus deformity left knee. 2. Severe flexion contractures, left knee. 3. Severe primary osteoarthritis with bone-on-bone, left knee.  OPERATION:  Left total knee arthroplasty utilizing DePuy system.  All three components were cemented and gentamicin was used in the cement. The sizes used were a size 4 narrow femoral component, left, posterior cruciate sacrificing type.  The tibial tray was a size 3, the insert was a size 4 10-mm thickness polyethylene rotating platform.  The patella was a size 5 with 3 pegs.  DESCRIPTION OF PROCEDURE:  Under spinal anesthesia followed by general anesthesia, as spinal anesthetic was not sufficient, they then went and gave her general anesthesia.  The routine time-out was carried out.  I also marked the appropriate left leg in the holding area.  At this time, the left leg was exsanguinated with Esmarch, tourniquet was elevated to 325 mmHg.  Following that, the leg was placed in the [facility] knee holder.  The knee was flexed and incision was made over the anterior aspect of the left knee, two flaps were created.  I then carried out the left median parapatellar incision.  The patella was reflected laterally. The knee was flexed.  I did medial and lateral meniscectomies and excised the anterior, posterior and cruciate ligaments.  Initial drill hole was made in the  intercondylar notch.  The canal finder was inserted and make sure we were in the canal, we were.  We then thoroughly irrigated out the canal and removed 12-mm thickness off the distal femur at this point.  Following that, we then did our anterior, posterior and chamfering cuts in usual fashion after we measured the femur to be a size 4.  We then prepared the tibia in the usual fashion, made a drill hole in the tibial plateau and then removed 4-mm thickness off the affected lateral side of the knee.  After that, we then inserted our laminar spreaders, removed the posterior spurs off the femoral condyles and completed our meniscectomies.  We then inserted our spacer blocks and had an excellent fit with the size 10 in flexion and extension and good stability.  We then removed the devices and then went on and continued to prepare the tibia.  We cut our keel cut out of the proximal tibial plateau in usual fashion.  We then cut our notch cut out of the distal femur.  We thoroughly irrigated out the area and made sure all loose pieces of bone were removed.  We then made sure after we had our inserts in, we then did our resurfacing procedure on the patella.  We then made three drill holes in  the patella and had excellent fit for 35 patella.  All trial components were cemented.  We then thoroughly water picked out the knee again, dried the knee out and cemented all three components in simultaneously.  Once the cement was hardened, we then removed all loose pieces of cement.  I then injected 20 mL of 0.25% Marcaine with epinephrine into the surrounding soft tissues.  I then inserted some Ultrafoam in the posterior compartment of the knee.  We then made sure once again that all loose pieces of cement were removed. I did water picked the knee out before I inserted the Ultrafoam.  I then inserted my permanent tibial plateau rotating platform insert, size 4 10- mm thickness, reduced the knee and had an  excellent fit.  We then inserted a Hemovac drain and closed the knee in layers in usual fashion. Sterile dressings were applied.  She had 2 g of IV Ancef preop and 1 g of tranexamic acid.          ______________________________ Kipp Brood. Gladstone Lighter, M.D.     RAG/MEDQ  D:  04/23/2016  T:  04/24/2016  Job:  [id number]

## 2016-04-24 NOTE — Care Management Note (Signed)
Case Management Note  Patient Details  Name: Melanie Lee MRN: [id number] Date of Birth: Jun 17, 1946  Subjective/Objective:                  TOTAL KNEE ARTHROPLASTY (Left) Action/Plan: Discharge planning Expected Discharge Date:  04/26/16               Expected Discharge Plan:  [place]  In-House Referral:     Discharge planning Services  CM Consult  Post Acute Care Choice:  Home Health Choice offered to:  Patient  DME Arranged:  Walker rolling DME Agency:  [place]:  PT [place] Agency:  [facility] (now [facility])  Status of Service:  Completed, signed off  If discussed at [employer] of Stay Meetings, dates discussed:    Additional Comments: CM met with pt in room to offer choice of home health agency.  Pt chooses [facility] to render HHPT.  Referral called to [employer], Tim.  CM called [facility] DME rep, Germaine to please deliver the rolling walker to room prior to discharge.  No other CM needs were communicated. Dellie Catholic, RN 04/24/2016, 10:26 AM

## 2016-04-24 NOTE — Progress Notes (Signed)
Physical Therapy Treatment Patient Details Name: Melanie Lee MRN: [id number] DOB: 15-Jan-1946 Today's Date: 04/24/2016    History of Present Illness LTKR    PT Comments    The patient continues to complain  Of a Head ache that has not let up. Assisted back to bed after walk to bathroom. Continue PT while in acute care.  Follow Up Recommendations  Home health PT;Supervision/Assistance - 24 hour     Equipment Recommendations  Rolling walker with 5" wheels    Recommendations for Other Services       Precautions / Restrictions Precautions Precautions: Knee Required Braces or Orthoses: Knee Immobilizer - Left Knee Immobilizer - Left: Discontinue once straight leg raise with < 10 degree lag    Mobility  Bed Mobility Overal bed mobility: Needs Assistance Bed Mobility: Sit to Supine     Supine to sit: Mod assist Sit to supine: Min assist   General bed mobility comments: assist with L leg  Transfers Overall transfer level: Needs assistance Equipment used: Rolling walker (2 wheeled) Transfers: Sit to/from Stand Sit to Stand: Mod assist         General transfer comment: VC for hand placement  Ambulation/Gait Ambulation/Gait assistance: Min assist Ambulation Distance (Feet): 15 Feet (x 2) Assistive device: Rolling walker (2 wheeled) Gait Pattern/deviations: Step-to pattern;Step-through pattern;Decreased step length - left;Decreased stance time - left     General Gait Details: cues for position inside the RW   Stairs            Wheelchair Mobility    Modified Rankin (Stroke Patients Only)       Balance                                    Cognition Arousal/Alertness: Awake/alert Behavior During Therapy: WFL for tasks assessed/performed Overall Cognitive Status: Within Functional Limits for tasks assessed                      Exercises Total Joint Exercises Ankle Circles/Pumps: Both;10 reps;Supine Quad Sets: AROM;Both;10  reps;Supine Towel Squeeze: AROM;Both;10 reps;Supine Heel Slides: AAROM;Left;10 reps;Supine Hip ABduction/ADduction: AAROM;Left;10 reps;Supine Straight Leg Raises: AAROM;Left;10 reps;Supine    General Comments        Pertinent Vitals/Pain Pain Assessment: 0-10 Pain Score: 8  Pain Location: head ache and L knee Pain Descriptors / Indicators: Aching;Discomfort;Dull;Guarding Pain Intervention(s): Limited activity within patient's tolerance;Premedicated before session;Repositioned;Ice applied    Home Living Family/patient expects to be discharged to:: Private residence Living Arrangements: Alone Available Help at Discharge: Family Type of Home: Apartment Home Access: Stairs to enter Entrance Stairs-Rails: Left;Right Home Layout: One level Home Equipment: None      Prior Function Level of Independence: Independent      Comments: working in Medical sales representative at [employer]   PT Goals (current goals can now be found in the care plan section) Acute Rehab PT Goals Patient Stated Goal: get back to work PT Goal Formulation: With patient Time For Goal Achievement: 05/01/16 Potential to Achieve Goals: Good Progress towards PT goals: Progressing toward goals    Frequency  7X/week    PT Plan Current plan remains appropriate    Co-evaluation             End of Session Equipment Utilized During Treatment: Left knee immobilizer Activity Tolerance: Patient limited by pain Patient left: with call bell/phone within reach;in bed     Time: 1405-1430 PT Time  Calculation (min) (ACUTE ONLY): 25 min  Charges:  $Gait Training: 8-22 mins $Therapeutic Exercise: 8-22 mins                    G Codes:      [name] 04/24/2016, 2:41 PM

## 2016-04-24 NOTE — Evaluation (Signed)
Occupational Therapy Evaluation Patient Details Name: Melanie Lee MRN: [id number] DOB: [birth year] Today's Date: 04/24/2016    History of Present Illness LTKR   Clinical Impression   Pt is s/p TKA resulting in the deficits listed below (see OT Problem List). Pt will benefit from skilled OT to increase their safety and independence with ADL and functional mobility for ADL to facilitate discharge to venue listed below.        Follow Up Recommendations  Home health OT;Supervision - Intermittent    Equipment Recommendations  3 in 1 bedside comode;Tub/shower seat           Mobility Bed Mobility               General bed mobility comments: pt in chair  Transfers Overall transfer level: Needs assistance   Transfers: Sit to/from Stand Sit to Stand: Mod assist         General transfer comment: VC for hand placement         ADL Overall ADL's : Needs assistance/impaired                                       General ADL Comments: Limited ADL eval as pt pt feeling sick.  Will continue ADL education next OT session               Pertinent Vitals/Pain Pain Assessment: 0-10 Pain Score: 3  Pain Location: L knee Pain Descriptors / Indicators: Sore Pain Intervention(s): Monitored during session     Hand Dominance     Extremity/Trunk Assessment Upper Extremity Assessment Upper Extremity Assessment: Generalized weakness           Communication Communication Communication: No difficulties   Cognition Arousal/Alertness: Awake/alert Behavior During Therapy: WFL for tasks assessed/performed Overall Cognitive Status: Within Functional Limits for tasks assessed                                Home Living Family/patient expects to be discharged to:: Private residence Living Arrangements: Alone Available Help at Discharge: Family Type of Home: Apartment Home Access: Stairs to enter Technical brewer of Steps: 3 Entrance  Stairs-Rails: Left;Right Home Layout: One level     Bathroom Shower/Tub: Teacher, early years/pre: Standard     Home Equipment: None          Prior Functioning/Environment Level of Independence: Independent        Comments: working in Medical sales representative at [employer]    OT Diagnosis: Generalized weakness   OT Problem List: Decreased strength;Decreased activity tolerance   OT Treatment/Interventions: Self-care/ADL training;DME and/or AE instruction;Patient/family education    OT Goals(Current goals can be found in the care plan section) Acute Rehab OT Goals Patient Stated Goal: get back to work OT Goal Formulation: With patient Time For Goal Achievement: 05/08/16 Potential to Achieve Goals: Good ADL Goals Pt Will Perform Grooming: standing;with supervision Pt Will Perform Lower Body Dressing: with supervision;sit to/from stand Pt Will Transfer to Toilet: with supervision;regular height toilet;ambulating Pt Will Perform Toileting - Clothing Manipulation and hygiene: with supervision;sit to/from stand Pt Will Perform Tub/Shower Transfer: with min guard assist  OT Frequency: Min 2X/week   Barriers to D/C:               End of Session Nurse Communication: Mobility status  Activity Tolerance: Other (comment) (lmited  by nausea) Patient left: in chair;with call bell/phone within reach   Time: 1137-1151 OT Time Calculation (min): 14 min Charges:  OT General Charges $OT Visit: 1 Procedure OT Evaluation $OT Eval Low Complexity: 1 Procedure G-Codes:    Betsy Pries 2016/05/14, 12:02 PM

## 2016-04-24 NOTE — Discharge Instructions (Signed)
INSTRUCTIONS AFTER JOINT REPLACEMENT   o Remove items at home which could result in a fall. This includes throw rugs or furniture in walking pathways o ICE to the affected joint every three hours while awake for 30 minutes at a time, for at least the first 3-5 days, and then as needed for pain and swelling.  Continue to use ice for pain and swelling. You may notice swelling that will progress down to the foot and ankle.  This is normal after surgery.  Elevate your leg when you are not up walking on it.   o Continue to use the breathing machine you got in the hospital (incentive spirometer) which will help keep your temperature down.  It is common for your temperature to cycle up and down following surgery, especially at night when you are not up moving around and exerting yourself.  The breathing machine keeps your lungs expanded and your temperature down.   DIET:  As you were doing prior to hospitalization, we recommend a well-balanced diet.  DRESSING / WOUND CARE / SHOWERING  You may change dressing every day with sterile gauze.  Please use good hand washing techniques before changing the dressing.  Do not use any lotions or creams on the incision until instructed by your surgeon. and You may shower 3 days after surgery, but keep the wounds dry during showering.  NO SOAKING/SUBMERGING IN THE BATHTUB.  If the bandage gets wet, change with a clean dry gauze.  If the incision gets wet, pat the wound dry with a clean towel.  ACTIVITY  o Increase activity slowly as tolerated, but follow the weight bearing instructions below.   o No driving for 6 weeks or until further direction given by your physician.  You cannot drive while taking narcotics.  o No lifting or carrying greater than 10 lbs. until further directed by your surgeon. o Avoid periods of inactivity such as sitting longer than an hour when not asleep. This helps prevent blood clots.  o You may return to work once you are authorized by your  doctor.     WEIGHT BEARING   Weight bearing as tolerated with assist device (walker, cane, etc) as directed, use it as long as suggested by your surgeon or therapist, typically at least 4-6 weeks.   EXERCISES  Results after joint replacement surgery are often greatly improved when you follow the exercise, range of motion and muscle strengthening exercises prescribed by your doctor. Safety measures are also important to protect the joint from further injury. Any time any of these exercises cause you to have increased pain or swelling, decrease what you are doing until you are comfortable again and then slowly increase them. If you have problems or questions, call your caregiver or physical therapist for advice.   Rehabilitation is important following a joint replacement. After just a few days of immobilization, the muscles of the leg can become weakened and shrink (atrophy).  These exercises are designed to build up the tone and strength of the thigh and leg muscles and to improve motion. Often times heat used for twenty to thirty minutes before working out will loosen up your tissues and help with improving the range of motion but do not use heat for the first two weeks following surgery (sometimes heat can increase post-operative swelling).   These exercises can be done on a training (exercise) mat, on the floor, on a table or on a bed. Use whatever works the best and is most comfortable  for you.    Use music or television while you are exercising so that the exercises are a pleasant break in your day. This will make your life better with the exercises acting as a break in your routine that you can look forward to.   Perform all exercises about fifteen times, three times per day or as directed.  You should exercise both the operative leg and the other leg as well.  Exercises include:    Quad Sets - Tighten up the muscle on the front of the thigh (Quad) and hold for 5-10 seconds.    Straight  Leg Raises - With your knee straight (if you were given a brace, keep it on), lift the leg to 60 degrees, hold for 3 seconds, and slowly lower the leg.  Perform this exercise against resistance later as your leg gets stronger.   Leg Slides: Lying on your back, slowly slide your foot toward your buttocks, bending your knee up off the floor (only go as far as is comfortable). Then slowly slide your foot back down until your leg is flat on the floor again.   Angel Wings: Lying on your back spread your legs to the side as far apart as you can without causing discomfort.   Hamstring Strength:  Lying on your back, push your heel against the floor with your leg straight by tightening up the muscles of your buttocks.  Repeat, but this time bend your knee to a comfortable angle, and push your heel against the floor.  You may put a pillow under the heel to make it more comfortable if necessary.   A rehabilitation program following joint replacement surgery can speed recovery and prevent re-injury in the future due to weakened muscles. Contact your doctor or a physical therapist for more information on knee rehabilitation.    CONSTIPATION  Constipation is defined medically as fewer than three stools per week and severe constipation as less than one stool per week.  Even if you have a regular bowel pattern at home, your normal regimen is likely to be disrupted due to multiple reasons following surgery.  Combination of anesthesia, postoperative narcotics, change in appetite and fluid intake all can affect your bowels.   YOU MUST use at least one of the following options; they are listed in order of increasing strength to get the job done.  They are all available over the counter, and you may need to use some, POSSIBLY even all of these options:    Drink plenty of fluids (prune juice may be helpful) and high fiber foods Colace 100 mg by mouth twice a day  Senokot for constipation as directed and as needed  Dulcolax (bisacodyl), take with full glass of water  Miralax (polyethylene glycol) once or twice a day as needed.  If you have tried all these things and are unable to have a bowel movement in the first 3-4 days after surgery call either your surgeon or your primary doctor.    If you experience loose stools or diarrhea, hold the medications until you stool forms back up.  If your symptoms do not get better within 1 week or if they get worse, check with your doctor.  If you experience "the worst abdominal pain ever" or develop nausea or vomiting, please contact the office immediately for further recommendations for treatment.   ITCHING:  If you experience itching with your medications, try taking only a single pain pill, or even half a pain pill at a  time.  You can also use Benadryl over the counter for itching or also to help with sleep.   TED HOSE STOCKINGS:  Use stockings on both legs until for at least 2 weeks or as directed by physician office. They may be removed at night for sleeping.  MEDICATIONS:  See your medication summary on the After Visit Summary that nursing will review with you.  You may have some home medications which will be placed on hold until you complete the course of blood thinner medication.  It is important for you to complete the blood thinner medication as prescribed.  PRECAUTIONS:  If you experience chest pain or shortness of breath - call 911 immediately for transfer to the hospital emergency department.   If you develop a fever greater that 101 F, purulent drainage from wound, increased redness or drainage from wound, foul odor from the wound/dressing, or calf pain - CONTACT YOUR SURGEON.                                                   FOLLOW-UP APPOINTMENTS:  If you do not already have a post-op appointment, please call the office for an appointment to be seen by your surgeon.  Guidelines for how soon to be seen are listed in your After Visit Summary, but are  typically between 1-4 weeks after surgery.  OTHER INSTRUCTIONS:   Take aspirin 325mg  twice daily to prevent blood clots   MAKE SURE YOU:   Understand these instructions.   Get help right away if you are not doing well or get worse.    Thank you for letting us be a part of your medical care team.  It is a privilege we respect greatly.  We hope these instructions will help you stay on track for a fast and full recovery!

## 2016-04-24 NOTE — Evaluation (Signed)
Physical Therapy Evaluation Patient Details Name: Melanie Lee MRN: [id number] DOB: June 27, 1946 Today's Date: 04/24/2016   History of Present Illness  LTKR  Clinical Impression  The patient c/o HA and N/V. RN aware. The patient is feeling poorly today. Pt admitted with above diagnosis. Pt currently with functional limitations due to the deficits listed below (see PT Problem List).  Pt will benefit from skilled PT to increase their independence and safety with mobility to allow discharge to the venue listed below.       Follow Up Recommendations Home health PT;Supervision/Assistance - 24 hour    Equipment Recommendations  Rolling walker with 5" wheels    Recommendations for Other Services       Precautions / Restrictions        Mobility  Bed Mobility Overal bed mobility: Needs Assistance Bed Mobility: Supine to Sit     Supine to sit: Mod assist     General bed mobility comments: assist with trunk  Transfers Overall transfer level: Needs assistance   Transfers: Sit to/from Stand Sit to Stand: Mod assist         General transfer comment: VC for hand placement  Ambulation/Gait Ambulation/Gait assistance: Mod assist Ambulation Distance (Feet): 15 Feet (x 2) Assistive device: Rolling walker (2 wheeled) Gait Pattern/deviations: Step-to pattern;Step-through pattern;Antalgic     General Gait Details: patient became nauseated , +emesis, RN aware.  Stairs            Wheelchair Mobility    Modified Rankin (Stroke Patients Only)       Balance                                             Pertinent Vitals/Pain Pain Assessment: 0-10 Pain Score: 6  Pain Location: Head ache Pain Descriptors / Indicators: Aching;Discomfort;Dull;Guarding Pain Intervention(s): Limited activity within patient's tolerance;Monitored during session;Repositioned    Home Living Family/patient expects to be discharged to:: Private residence Living Arrangements:  Alone Available Help at Discharge: Family Type of Home: Apartment Home Access: Stairs to enter Entrance Stairs-Rails: Chemical engineer of Steps: 3 Home Layout: One level Home Equipment: None      Prior Function Level of Independence: Independent         Comments: working in Medical sales representative at [employer]     [employer]        Extremity/Trunk Assessment   Upper Extremity Assessment: Generalized weakness                     Communication   Communication: No difficulties  Cognition Arousal/Alertness: Awake/alert Behavior During Therapy: WFL for tasks assessed/performed Overall Cognitive Status: Within Functional Limits for tasks assessed                      General Comments      Exercises        Assessment/Plan    PT Assessment Patient needs continued PT services  PT Diagnosis Difficulty walking   PT Problem List Decreased strength;Decreased range of motion;Decreased activity tolerance;Decreased balance;Decreased mobility;Decreased safety awareness;Decreased knowledge of precautions;Decreased knowledge of use of DME  PT Treatment Interventions DME instruction;Gait training;Stair training;Functional mobility training;Therapeutic activities;Therapeutic exercise;Patient/family education   PT Goals (Current goals can be found in the Care Plan section) Acute Rehab PT Goals Patient Stated Goal: get back to work PT Goal Formulation: With patient Time For  Goal Achievement: 05/01/16 Potential to Achieve Goals: Good    Frequency 7X/week   Barriers to discharge        Co-evaluation               End of Session Equipment Utilized During Treatment: Left knee immobilizer Activity Tolerance: Patient tolerated treatment well Patient left: with call bell/phone within reach;in chair Nurse Communication: Mobility status (nausea)         Time: [id number] PT Time Calculation (min) (ACUTE ONLY): 25 min   Charges:   PT  Evaluation $PT Eval Low Complexity: 1 Procedure PT Treatments $Gait Training: 8-22 mins   PT G Codes:        [name] 04/24/2016, 12:24 PM Tresa Endo PT [phone]

## 2016-04-24 NOTE — Progress Notes (Signed)
Subjective: 1 Day Post-Op Procedure(s) (LRB): TOTAL KNEE ARTHROPLASTY (Left) Patient reports pain as 2 on 0-10 scale.  Doing very well today. Hemovac DCd and wound is dry.WBC 19.3,probably secondary to Hemovac in wound.Will follow with repeat CBC.  Objective: Vital signs in last 24 hours: Temp:  [97.4 F (36.3 C)-99.9 F (37.7 C)] 98.1 F (36.7 C) (07/12 0656) Pulse Rate:  [64-88] 64 (07/12 0656) Resp:  [12-26] 15 (07/12 0656) BP: (132-176)/(66-114) 157/66 mmHg (07/12 0656) SpO2:  [93 %-100 %] 99 % (07/12 0656) Weight:  [73.936 kg (163 lb)] 73.936 kg (163 lb) (07/11 0954)  Intake/Output from previous day: 07/11 0701 - 07/12 0700 In: 4785 [P.O.:360; I.V.:4315; IV Piggyback:110] Out: 3180 [Urine:3050; Drains:80; Blood:50] Intake/Output this shift:     Recent Labs  04/24/16 0414  HGB 13.1    Recent Labs  04/24/16 0414  WBC 19.3*  RBC 4.52  HCT 39.8  PLT 328    Recent Labs  04/24/16 0414  NA 135  K 3.9  CL 100*  CO2 26  BUN 9  CREATININE 0.51  GLUCOSE 135*  CALCIUM 9.2   No results for input(s): LABPT, INR in the last 72 hours.  Neurovascular intact Dorsiflexion/Plantar flexion intact No cellulitis present  Assessment/Plan: 1 Day Post-Op Procedure(s) (LRB): TOTAL KNEE ARTHROPLASTY (Left) Up with therapy  Garnetta Fedrick A 04/24/2016, 7:33 AM

## 2016-04-25 LAB — CBC
HCT: 35.7 % — ABNORMAL LOW (ref 36.0–46.0)
Hemoglobin: 11.6 g/dL — ABNORMAL LOW (ref 12.0–15.0)
MCH: 29.1 pg (ref 26.0–34.0)
MCHC: 32.5 g/dL (ref 30.0–36.0)
MCV: 89.5 fL (ref 78.0–100.0)
Platelets: 308 10*3/uL (ref 150–400)
RBC: 3.99 MIL/uL (ref 3.87–5.11)
RDW: 12.5 % (ref 11.5–15.5)
WBC: 16.4 10*3/uL — ABNORMAL HIGH (ref 4.0–10.5)

## 2016-04-25 LAB — BASIC METABOLIC PANEL
Anion gap: 6 (ref 5–15)
BUN: 11 mg/dL (ref 6–20)
CO2: 29 mmol/L (ref 22–32)
Calcium: 8.6 mg/dL — ABNORMAL LOW (ref 8.9–10.3)
Chloride: 100 mmol/L — ABNORMAL LOW (ref 101–111)
Creatinine, Ser: 0.64 mg/dL (ref 0.44–1.00)
GFR calc Af Amer: >60 mL/min (ref >60)
GFR calc non Af Amer: >60 mL/min (ref >60)
Glucose, Bld: 118 mg/dL — ABNORMAL HIGH (ref 65–99)
Potassium: 4 mmol/L (ref 3.5–5.1)
Sodium: 135 mmol/L (ref 135–145)

## 2016-04-25 MED ORDER — METHOCARBAMOL 500 MG PO TABS: 500.0000 mg | ORAL_TABLET | Freq: Four times a day (QID) | ORAL | Status: DC | PRN

## 2016-04-25 MED ORDER — HYDROCODONE-ACETAMINOPHEN 5-325 MG PO TABS: 1.0000 | ORAL_TABLET | ORAL | Status: DC | PRN

## 2016-04-25 MED ORDER — ASPIRIN EC 325 MG PO TBEC: 325.0000 mg | DELAYED_RELEASE_TABLET | Freq: Two times a day (BID) | ORAL | Status: DC

## 2016-04-25 NOTE — Progress Notes (Signed)
Physical Therapy Treatment Patient Details Name: Melanie Lee MRN: [id number] DOB: 10-Oct-1946 Today's Date: 04/25/2016    History of Present Illness LTKR    PT Comments    Reviewed exercises, KI and  Mobility with family. Ready for DC.  Follow Up Recommendations  Home health PT;Supervision/Assistance - 24 hour     Equipment Recommendations  Rolling walker with 5" wheels    Recommendations for Other Services       Precautions / Restrictions Precautions Precautions: Knee Required Braces or Orthoses: Knee Immobilizer - Left Knee Immobilizer - Left: Discontinue once straight leg raise with < 10 degree lag    Mobility  BWheelchair Mobility    Modified Rankin (Stroke Patients Only)       Balance                                    Cognition Arousal/Alertness: Awake/alert                          Exercises Total Joint Exercises Ankle Circles/Pumps: Both;10 reps;Supine Quad Sets: AROM;Both;10 reps;Supine Towel Squeeze: AROM;Both;10 reps;Supine Short Arc Quad: AAROM;Left;10 reps;Supine Heel Slides: AAROM;Left;10 reps;Supine Hip ABduction/ADduction: AAROM;Left;10 reps;Supine Straight Leg Raises: AAROM;Left;10 reps;Supine Goniometric ROM: lt knee flexion 50    General Comments        Pertinent Vitals/Pain Pain Score: 2  Pain Location: lt knee Pain Descriptors / Indicators: Tightness Pain Intervention(s): Monitored during session;Premedicated before session;Repositioned;Ice applied    Home Living                      Prior Function            PT Goals (current goals can now be found in the care plan section) Progress towards PT goals: Progressing toward goals    Frequency  7X/week    PT Plan Current plan remains appropriate    Co-evaluation             End of Session Equipment Utilized During Treatment: Left knee immobilizer Activity Tolerance: Patient tolerated treatment well Patient left: in chair;with  call bell/phone within reach;with family/visitor present     Time: 1449-1500 PT Time Calculation (min) (ACUTE ONLY): 11 min  Charges:  $Gait Training: 8-22 mins $Therapeutic Exercise: 8-22 mins $Self Care/Home Management: 23-Jun-2023                    G Codes:      [name] 04/25/2016, 4:51 PM

## 2016-04-25 NOTE — Progress Notes (Signed)
   Subjective: 2 Days Post-Op Procedure(s) (LRB): TOTAL KNEE ARTHROPLASTY (Left) Patient reports pain as mild.   Patient seen in rounds for Dr. Gladstone Lighter Patient is well, and has had no acute complaints or problems. She reports that her headache resolved. Blood pressure in normal range now. Voiding well. Positive flatus. No SOB or chest pain.  Plan is to go Home after hospital stay.  Objective: Vital signs in last 24 hours: Temp:  [98.5 F (36.9 C)-98.8 F (37.1 C)] 98.8 F (37.1 C) (07/13 0539) Pulse Rate:  [69-84] 84 (07/13 0539) Resp:  [16] 16 (07/13 0539) BP: (121-155)/(68-78) 121/74 mmHg (07/13 0539) SpO2:  [95 %-99 %] 95 % (07/13 0539)  Intake/Output from previous day:  Intake/Output Summary (Last 24 hours) at 04/25/16 0702 Last data filed at 04/25/16 0252  Gross per 24 hour  Intake    540 ml  Output    875 ml  Net   -335 ml     Labs:  Recent Labs  04/24/16 0414 04/25/16 0412  HGB 13.1 11.6*    Recent Labs  04/24/16 0414 04/25/16 0412  WBC 19.3* 16.4*  RBC 4.52 3.99  HCT 39.8 35.7*  PLT 328 308    Recent Labs  04/24/16 0414 04/25/16 0412  NA 135 135  K 3.9 4.0  CL 100* 100*  CO2 26 29  BUN 9 11  CREATININE 0.51 0.64  GLUCOSE 135* 118*  CALCIUM 9.2 8.6*    EXAM General - Patient is Alert and Oriented Extremity - Neurologically intact Intact pulses distally Dorsiflexion/Plantar flexion intact No cellulitis present Compartment soft Dressing/Incision - clean, dry, no drainage  Motor Function - intact, moving foot and toes well on exam.   Past Medical History  Diagnosis Date  . Elevated blood pressure (not hypertension)     "elevates sometimes with MD visits"  . 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 Frequent headaches     04-19-16 "no longer bothered"- since no cholesterol med     Assessment/Plan: 2 Days Post-Op Procedure(s) (LRB): TOTAL KNEE ARTHROPLASTY (Left) Active Problems:   Status  post total left knee replacement  Estimated body mass index is 28.88 kg/(m^2) as calculated from the following:   Height as of this encounter: 5\' 3"  (1.6 m).   Weight as of this encounter: 73.936 kg (163 lb). Advance diet Up with therapy D/C IV fluids Discharge home with home health  DVT Prophylaxis - Aspirin Weight-Bearing as tolerated   Continue therapy today. May need two sessions today since she was limited by a headache yesterday. Encourage incentive spirometer and bowel protocol. Discharge instructions discussed. Follow up in office in 2 weeks with Dr Gladstone Lighter.   Ardeen Jourdain, PA-C Orthopaedic Surgery 04/25/2016, 7:02 AM

## 2016-04-25 NOTE — Progress Notes (Signed)
RN reviewed discharge instructions with patient and grandaugher and daughter. All questions answered.  Paperwork and prescriptions.   NT rolled patient down with all belongings to family car.

## 2016-04-25 NOTE — Progress Notes (Signed)
Occupational Therapy Treatment Patient Details Name: Melanie Lee MRN: [id number] DOB: 1946/09/30 Today's Date: 04/25/2016    History of present illness LTKR   OT comments  Pt much improved  Follow Up Recommendations  Home health OT;Supervision - Intermittent          Precautions / Restrictions Precautions Precautions: Knee Required Braces or Orthoses: Knee Immobilizer - Left Knee Immobilizer - Left: Discontinue once straight leg raise with < 10 degree lag Restrictions Weight Bearing Restrictions: No       Mobility Bed Mobility               General bed mobility comments: pt in chair  Transfers Overall transfer level: Needs assistance Equipment used: Rolling walker (2 wheeled) Transfers: Sit to/from [facility] Sit to Stand: Supervision                  ADL Overall ADL's : Needs assistance/impaired     Grooming: Standing           Upper Body Dressing : Set up;Sitting   Lower Body Dressing: Sit to/from stand;Cueing for sequencing;Cueing for compensatory techniques   Toilet Transfer: Min guard;RW;Comfort height toilet;Ambulation   Toileting- Clothing Manipulation and Hygiene: Supervision/safety;Sit to/from Nurse, children's Details (indicate cue type and reason): declined practice- verbalized safety   General ADL Comments: pt much better this day!                Cognition   Behavior During Therapy: WFL for tasks assessed/performed Overall Cognitive Status: Within Functional Limits for tasks assessed                               General Comments      Pertinent Vitals/ Pain       Pain Score: 3  Pain Location: l knee Pain Descriptors / Indicators: Sore Pain Intervention(s): Monitored during session;Repositioned;Ice applied         Frequency Min 2X/week     Progress Toward Goals  OT Goals(current goals can now be found in the care plan section)  Progress towards OT goals:  Progressing toward goals     Plan Discharge plan remains appropriate       End of Session Equipment Utilized During Treatment: Rolling walker   Activity Tolerance Patient tolerated treatment well   Patient Left in chair;with call bell/phone within reach   Nurse Communication Mobility status        Time: [id number] OT Time Calculation (min): 12 min  Charges: OT General Charges $OT Visit: 1 Procedure OT Treatments $Self Care/Home Management : 8-22 mins  Marco Raper D 04/25/2016, 10:51 AM

## 2016-04-25 NOTE — Progress Notes (Signed)
Physical Therapy Treatment Patient Details Name: Melanie Lee MRN: [id number] DOB: [birth year] Today's Date: 04/25/2016    History of Present Illness LTKR    PT Comments    The patien is progressing well today. Still c/o mild HA. Plans DC after next PT  Follow Up Recommendations  Home health PT;Supervision/Assistance - 24 hour     Equipment Recommendations  Rolling walker with 5" wheels    Recommendations for Other Services       Precautions / Restrictions Precautions Precautions: Knee Required Braces or Orthoses: Knee Immobilizer - Left Knee Immobilizer - Left: Discontinue once straight leg raise with < 10 degree lag    Mobility  Bed Mobility   Bed Mobility: Supine to Sit;Sit to Supine     Supine to sit: Min assist Sit to supine: Min assist      Transfers   Equipment used: Rolling walker (2 wheeled) Transfers: Sit to/from Stand Sit to Stand: Supervision         General transfer comment: VC for hand placement  Ambulation/Gait Ambulation/Gait assistance: Min guard Ambulation Distance (Feet): 80 Feet Assistive device: Rolling walker (2 wheeled) Gait Pattern/deviations: Step-through pattern;Step-to pattern     General Gait Details: cues for position inside the RW   Stairs Stairs: Yes Stairs assistance: Min assist Stair Management: Forwards;One rail Right;One rail Left;Step to pattern Number of Stairs: 3 General stair comments: had difficulty stepping down due to DJD R knee.  Wheelchair Mobility    Modified Rankin (Stroke Patients Only)       Balance                                    Cognition Arousal/Alertness: Awake/alert                          Exercises Total Joint Exercises Ankle Circles/Pumps: Both;10 reps;Supine Quad Sets: AROM;Both;10 reps;Supine Towel Squeeze: AROM;Both;10 reps;Supine Short Arc Quad: AAROM;Left;10 reps;Supine Heel Slides: AAROM;Left;10 reps;Supine Hip ABduction/ADduction:  AAROM;Left;10 reps;Supine Straight Leg Raises: AAROM;Left;10 reps;Supine Goniometric ROM: lt knee flexion 50    General Comments        Pertinent Vitals/Pain Pain Score: 2  Pain Location: lt knee Pain Descriptors / Indicators: Tightness Pain Intervention(s): Monitored during session;Premedicated before session;Repositioned;Ice applied    Home Living                      Prior Function            PT Goals (current goals can now be found in the care plan section) Progress towards PT goals: Progressing toward goals    Frequency  7X/week    PT Plan Current plan remains appropriate    Co-evaluation             End of Session Equipment Utilized During Treatment: Left knee immobilizer Activity Tolerance: Patient tolerated treatment well Patient left: in chair;with call bell/phone within reach     Time: 0901-0944 PT Time Calculation (min) (ACUTE ONLY): 43 min  Charges:  $Gait Training: 8-22 mins $Therapeutic Exercise: 8-22 mins $Self Care/Home Management: June 10, 2023                    G Codes:      [name] 04/25/2016, 4:49 PM

## 2016-04-26 DIAGNOSIS — E785 Hyperlipidemia, unspecified: Secondary | ICD-10-CM | POA: Diagnosis not present

## 2016-04-26 DIAGNOSIS — M1711 Unilateral primary osteoarthritis, right knee: Secondary | ICD-10-CM | POA: Diagnosis not present

## 2016-04-26 DIAGNOSIS — Z7982 Long term (current) use of aspirin: Secondary | ICD-10-CM | POA: Diagnosis not present

## 2016-04-26 DIAGNOSIS — Z9181 History of falling: Secondary | ICD-10-CM | POA: Diagnosis not present

## 2016-04-26 DIAGNOSIS — Z471 Aftercare following joint replacement surgery: Secondary | ICD-10-CM | POA: Diagnosis not present

## 2016-04-26 DIAGNOSIS — Z96652 Presence of left artificial knee joint: Secondary | ICD-10-CM | POA: Diagnosis not present

## 2016-04-29 DIAGNOSIS — E785 Hyperlipidemia, unspecified: Secondary | ICD-10-CM | POA: Diagnosis not present

## 2016-04-29 DIAGNOSIS — M1711 Unilateral primary osteoarthritis, right knee: Secondary | ICD-10-CM | POA: Diagnosis not present

## 2016-04-29 DIAGNOSIS — Z96652 Presence of left artificial knee joint: Secondary | ICD-10-CM | POA: Diagnosis not present

## 2016-04-29 DIAGNOSIS — Z471 Aftercare following joint replacement surgery: Secondary | ICD-10-CM | POA: Diagnosis not present

## 2016-04-29 DIAGNOSIS — Z7982 Long term (current) use of aspirin: Secondary | ICD-10-CM | POA: Diagnosis not present

## 2016-04-29 DIAGNOSIS — Z9181 History of falling: Secondary | ICD-10-CM | POA: Diagnosis not present

## 2016-05-01 DIAGNOSIS — Z7982 Long term (current) use of aspirin: Secondary | ICD-10-CM | POA: Diagnosis not present

## 2016-05-01 DIAGNOSIS — Z471 Aftercare following joint replacement surgery: Secondary | ICD-10-CM | POA: Diagnosis not present

## 2016-05-01 DIAGNOSIS — Z96652 Presence of left artificial knee joint: Secondary | ICD-10-CM | POA: Diagnosis not present

## 2016-05-01 DIAGNOSIS — E785 Hyperlipidemia, unspecified: Secondary | ICD-10-CM | POA: Diagnosis not present

## 2016-05-01 DIAGNOSIS — Z9181 History of falling: Secondary | ICD-10-CM | POA: Diagnosis not present

## 2016-05-01 DIAGNOSIS — M1711 Unilateral primary osteoarthritis, right knee: Secondary | ICD-10-CM | POA: Diagnosis not present

## 2016-05-01 NOTE — Discharge Summary (Signed)
Physician Discharge Summary   Patient ID: Melanie Lee MRN: [id number] DOB/AGE: 1946/07/17 70 y.o.  Admit date: 04/23/2016 Discharge date: 04/25/2016  Primary Diagnosis: Primary osteoarthritis left knee  Admission Diagnoses:  Past Medical History  Diagnosis Date  . Elevated blood pressure (not hypertension)     "elevates sometimes with MD visits"  . 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 Frequent headaches     04-19-16 "no longer bothered"- since no cholesterol med    Discharge Diagnoses:   Active Problems:   Status post total left knee replacement  Estimated body mass index is 28.88 kg/(m^2) as calculated from the following:   Height as of this encounter: '5\' 3"'$  (1.6 m).   Weight as of this encounter: 73.936 kg (163 lb).  Procedure:  Procedure(s) (LRB): TOTAL KNEE ARTHROPLASTY (Left)   Consults: None  HPI: Melanie Lee, 70 y.o. female, has a history of pain and functional disability in the left knee due to arthritis and has failed non-surgical conservative treatments for greater than 12 weeks to includeNSAID's and/or analgesics, corticosteriod injections and activity modification. Onset of symptoms was gradual, starting >10 years ago with gradually worsening course since that time. The patient noted prior procedures on the knee to include arthroscopy and menisectomy on the left knee(s). Patient currently rates pain in the left knee(s) at 8 out of 10 with activity. Patient has night pain, worsening of pain with activity and weight bearing, pain that interferes with activities of daily living, pain with passive range of motion, crepitus and joint swelling. Patient has evidence of periarticular osteophytes and joint space narrowing by imaging studies. There is no active infection.  Laboratory Data: Admission on 04/23/2016, Discharged on 04/25/2016  Component Date Value Ref Range Status  . WBC 04/24/2016 19.3* 4.0 - 10.5 K/uL Final    . RBC 04/24/2016 4.52  3.87 - 5.11 MIL/uL Final  . Hemoglobin 04/24/2016 13.1  12.0 - 15.0 g/dL Final  . HCT 04/24/2016 39.8  36.0 - 46.0 % Final  . MCV 04/24/2016 88.1  78.0 - 100.0 fL Final  . MCH 04/24/2016 29.0  26.0 - 34.0 pg Final  . MCHC 04/24/2016 32.9  30.0 - 36.0 g/dL Final  . RDW 04/24/2016 12.2  11.5 - 15.5 % Final  . Platelets 04/24/2016 328  150 - 400 K/uL Final  . Sodium 04/24/2016 135  135 - 145 mmol/L Final  . Potassium 04/24/2016 3.9  3.5 - 5.1 mmol/L Final  . Chloride 04/24/2016 100* 101 - 111 mmol/L Final  . CO2 04/24/2016 26  22 - 32 mmol/L Final  . Glucose, Bld 04/24/2016 135* 65 - 99 mg/dL Final  . BUN 04/24/2016 9  6 - 20 mg/dL Final  . Creatinine, Ser 04/24/2016 0.51  0.44 - 1.00 mg/dL Final  . Calcium 04/24/2016 9.2  8.9 - 10.3 mg/dL Final  . GFR calc non Af Amer 04/24/2016 >60  >60 mL/min Final  . GFR calc Af Amer 04/24/2016 >60  >60 mL/min Final   Comment: (NOTE) The eGFR has been calculated using the CKD EPI equation. This calculation has not been validated in all clinical situations. eGFR's persistently <60 mL/min signify possible Chronic Kidney Disease.   . Anion gap 04/24/2016 9  5 - 15 Final  . WBC 04/25/2016 16.4* 4.0 - 10.5 K/uL Final  . RBC 04/25/2016 3.99  3.87 - 5.11 MIL/uL Final  . Hemoglobin 04/25/2016 11.6* 12.0 - 15.0 g/dL Final  . HCT 04/25/2016  35.7* 36.0 - 46.0 % Final  . MCV 04/25/2016 89.5  78.0 - 100.0 fL Final  . MCH 04/25/2016 29.1  26.0 - 34.0 pg Final  . MCHC 04/25/2016 32.5  30.0 - 36.0 g/dL Final  . RDW 04/25/2016 12.5  11.5 - 15.5 % Final  . Platelets 04/25/2016 308  150 - 400 K/uL Final  . Sodium 04/25/2016 135  135 - 145 mmol/L Final  . Potassium 04/25/2016 4.0  3.5 - 5.1 mmol/L Final  . Chloride 04/25/2016 100* 101 - 111 mmol/L Final  . CO2 04/25/2016 29  22 - 32 mmol/L Final  . Glucose, Bld 04/25/2016 118* 65 - 99 mg/dL Final  . BUN 04/25/2016 11  6 - 20 mg/dL Final  . Creatinine, Ser 04/25/2016 0.64  0.44 - 1.00  mg/dL Final  . Calcium 04/25/2016 8.6* 8.9 - 10.3 mg/dL Final  . GFR calc non Af Amer 04/25/2016 >60  >60 mL/min Final  . GFR calc Af Amer 04/25/2016 >60  >60 mL/min Final   Comment: (NOTE) The eGFR has been calculated using the CKD EPI equation. This calculation has not been validated in all clinical situations. eGFR's persistently <60 mL/min signify possible Chronic Kidney Disease.   Georgiann Hahn gap 04/25/2016 6  5 - 15 Final  Hospital Outpatient Visit on 04/19/2016  Component Date Value Ref Range Status  . MRSA, PCR 04/19/2016 NEGATIVE  NEGATIVE Final  . Staphylococcus aureus 04/19/2016 NEGATIVE  NEGATIVE Final   Comment:        The Xpert SA Assay (FDA approved for NASAL specimens in patients over 15 years of age), is one component of a comprehensive surveillance program.  Test performance has been validated by [facility] for patients greater than or equal to 41 year old. It is not intended to diagnose infection nor to guide or monitor treatment.   Marland Kitchen aPTT 04/19/2016 31  24 - 37 seconds Final  . WBC 04/19/2016 8.9  4.0 - 10.5 K/uL Final  . RBC 04/19/2016 4.74  3.87 - 5.11 MIL/uL Final  . Hemoglobin 04/19/2016 13.6  12.0 - 15.0 g/dL Final  . HCT 04/19/2016 41.2  36.0 - 46.0 % Final  . MCV 04/19/2016 86.9  78.0 - 100.0 fL Final  . MCH 04/19/2016 28.7  26.0 - 34.0 pg Final  . MCHC 04/19/2016 33.0  30.0 - 36.0 g/dL Final  . RDW 04/19/2016 12.4  11.5 - 15.5 % Final  . Platelets 04/19/2016 322  150 - 400 K/uL Final  . Neutrophils Relative % 04/19/2016 71   Final  . Neutro Abs 04/19/2016 6.3  1.7 - 7.7 K/uL Final  . Lymphocytes Relative 04/19/2016 24   Final  . Lymphs Abs 04/19/2016 2.1  0.7 - 4.0 K/uL Final  . Monocytes Relative 04/19/2016 5   Final  . Monocytes Absolute 04/19/2016 0.5  0.1 - 1.0 K/uL Final  . Eosinophils Relative 04/19/2016 0   Final  . Eosinophils Absolute 04/19/2016 0.0  0.0 - 0.7 K/uL Final  . Basophils Relative 04/19/2016 0   Final  . Basophils Absolute  04/19/2016 0.0  0.0 - 0.1 K/uL Final  . Prothrombin Time 04/19/2016 13.7  11.6 - 15.2 seconds Final  . INR 04/19/2016 1.03  0.00 - 1.49 Final  . ABO/RH(D) 04/19/2016 O POS   Final  . Antibody Screen 04/19/2016 NEG   Final  . Sample Expiration 04/19/2016 04/26/2016   Final  . Extend sample reason 04/19/2016 NO TRANSFUSIONS OR PREGNANCY IN THE PAST 3 MONTHS  Final  . Color, Urine 04/19/2016 YELLOW  YELLOW Final  . APPearance 04/19/2016 CLEAR  CLEAR Final  . Specific Gravity, Urine 04/19/2016 1.023  1.005 - 1.030 Final  . pH 04/19/2016 5.0  5.0 - 8.0 Final  . Glucose, UA 04/19/2016 NEGATIVE  NEGATIVE mg/dL Final  . Hgb urine dipstick 04/19/2016 TRACE* NEGATIVE Final  . Bilirubin Urine 04/19/2016 NEGATIVE  NEGATIVE Final  . Ketones, ur 04/19/2016 NEGATIVE  NEGATIVE mg/dL Final  . Protein, ur 04/19/2016 NEGATIVE  NEGATIVE mg/dL Final  . Nitrite 04/19/2016 NEGATIVE  NEGATIVE Final  . Leukocytes, UA 04/19/2016 NEGATIVE  NEGATIVE Final  . Squamous Epithelial / LPF 04/19/2016 0-5* NONE SEEN Final  . WBC, UA 04/19/2016 0-5  0 - 5 WBC/hpf Final  . RBC / HPF 04/19/2016 0-5  0 - 5 RBC/hpf Final  . Bacteria, UA 04/19/2016 FEW* NONE SEEN Final  . ABO/RH(D) 04/19/2016 O POS   Final  Office Visit on 04/01/2016  Component Date Value Ref Range Status  . Cholesterol 04/01/2016 166  0 - 200 mg/dL Final   ATP III Classification       Desirable:  < 200 mg/dL               Borderline High:  200 - 239 mg/dL          High:  > = 240 mg/dL  . Triglycerides 04/01/2016 98.0  0.0 - 149.0 mg/dL Final   Normal:  <150 mg/dLBorderline High:  150 - 199 mg/dL  . HDL 04/01/2016 55.50  >39.00 mg/dL Final  . VLDL 04/01/2016 19.6  0.0 - 40.0 mg/dL Final  . LDL Cholesterol 04/01/2016 91  0 - 99 mg/dL Final  . Total CHOL/HDL Ratio 04/01/2016 3   Final                  Men          Women1/2 Average Risk     3.4          3.3Average Risk          5.0          4.42X Average Risk          9.6          7.13X Average Risk           15.0          11.0                      . NonHDL 04/01/2016 110.50   Final   NOTE:  Non-HDL goal should be 30 mg/dL higher than patient's LDL goal (i.e. LDL goal of < 70 mg/dL, would have non-HDL goal of < 100 mg/dL)  . Sodium 04/01/2016 139  135 - 145 mEq/L Final  . Potassium 04/01/2016 3.9  3.5 - 5.1 mEq/L Final  . Chloride 04/01/2016 105  96 - 112 mEq/L Final  . CO2 04/01/2016 23  19 - 32 mEq/L Final  . Glucose, Bld 04/01/2016 98  70 - 99 mg/dL Final  . BUN 04/01/2016 11  6 - 23 mg/dL Final  . Creatinine, Ser 04/01/2016 0.61  0.40 - 1.20 mg/dL Final  . Total Bilirubin 04/01/2016 0.5  0.2 - 1.2 mg/dL Final  . Alkaline Phosphatase 04/01/2016 87  39 - 117 U/L Final  . AST 04/01/2016 20  0 - 37 U/L Final  . ALT 04/01/2016 13  0 - 35 U/L Final  . Total Protein 04/01/2016 8.3  6.0 - 8.3 g/dL Final  . Albumin 04/01/2016 4.5  3.5 - 5.2 g/dL Final  . Calcium 04/01/2016 9.8  8.4 - 10.5 mg/dL Final  . GFR 04/01/2016 125.45  >60.00 mL/min Final  . INR 04/01/2016 1.1* 0.8 - 1.0 ratio Final  . Prothrombin Time 04/01/2016 11.8  9.6 - 13.1 sec Final     X-Rays:Dg Chest 2 View  04/19/2016  CLINICAL DATA:  Preoperative exam prior total knee prosthesis placement. EXAM: CHEST  2 VIEW COMPARISON:  None in FINDINGS: The lungs are adequately inflated. There is a 3 mm diameter calcified nodule in the left mid to lower lung. There is no alveolar infiltrate or pleural effusion. The heart and pulmonary vascularity are normal. The mediastinum is normal in width. There are degenerative changes of both shoulders and at multiple thoracic disc levels. IMPRESSION: There is no acute cardiopulmonary abnormality. There is evidence of previous granulomatous infection. Electronically Signed   By: David  Martinique M.D.   On: 04/19/2016 13:51    EKG: Orders placed or performed in visit on 04/01/16  . EKG 12-Lead     Hospital Course: Melanie Lee is a 70 y.o. who was admitted to [facility]. They were brought to  the operating room on 04/23/2016 and underwent Procedure(s): TOTAL KNEE ARTHROPLASTY.  Patient tolerated the procedure well and was later transferred to the recovery room and then to the orthopaedic floor for postoperative care.  They were given PO and IV analgesics for pain control following their surgery.  They were given 24 hours of postoperative antibiotics of  Anti-infectives    Start     Dose/Rate Route Frequency Ordered Stop   04/23/16 1830  ceFAZolin (ANCEF) IVPB 1 g/50 mL premix     1 g 100 mL/hr over 30 Minutes Intravenous Every 6 hours 04/23/16 1627 04/24/16 0229   04/23/16 1414  polymyxin B 500,000 Units, bacitracin 50,000 Units in sodium chloride irrigation 0.9 % 500 mL irrigation  Status:  Discontinued       As needed 04/23/16 1414 04/23/16 1457   04/23/16 0934  ceFAZolin (ANCEF) IVPB 2g/100 mL premix     2 g 200 mL/hr over 30 Minutes Intravenous On call to O.R. 04/23/16 1694 04/23/16 1245     and started on DVT prophylaxis in the form of Aspirin.   PT and OT were ordered for total joint protocol.  Discharge planning consulted to help with postop disposition and equipment needs.  Patient had a fair night on the evening of surgery. She did have some issues with hypertension which were responsive to metoprolol.  They started to get up OOB with therapy on day one. Hemovac drain was pulled without difficulty.  Continued to work with therapy into day two.  Dressing was changed on day two and the incision was clean and dry. The patient had progressed with therapy and meeting their goals.  Incision was healing well.  Patient was seen in rounds and was ready to go home.   Diet: Cardiac diet Activity:WBAT Follow-up:in 2 weeks Disposition - Home Discharged Condition: stable   Discharge Instructions    Call MD / Call 911    Complete by:  As directed   If you experience chest pain or shortness of breath, CALL 911 and be transported to the hospital emergency room.  If you develope a fever  above 101 F, pus (white drainage) or increased drainage or redness at the wound, or calf pain, call your surgeon's office.     Constipation  Prevention    Complete by:  As directed   Drink plenty of fluids.  Prune juice may be helpful.  You may use a stool softener, such as Colace (over the counter) 100 mg twice a day.  Use MiraLax (over the counter) for constipation as needed.     Diet - low sodium heart healthy    Complete by:  As directed      Discharge instructions    Complete by:  As directed   INSTRUCTIONS AFTER JOINT REPLACEMENT   Remove items at home which could result in a fall. This includes throw rugs or furniture in walking pathways ICE to the affected joint every three hours while awake for 30 minutes at a time, for at least the first 3-5 days, and then as needed for pain and swelling.  Continue to use ice for pain and swelling. You may notice swelling that will progress down to the foot and ankle.  This is normal after surgery.  Elevate your leg when you are not up walking on it.   Continue to use the breathing machine you got in the hospital (incentive spirometer) which will help keep your temperature down.  It is common for your temperature to cycle up and down following surgery, especially at night when you are not up moving around and exerting yourself.  The breathing machine keeps your lungs expanded and your temperature down.   DIET:  As you were doing prior to hospitalization, we recommend a well-balanced diet.  DRESSING / WOUND CARE / SHOWERING  You may change dressing every day with sterile gauze.  Please use good hand washing techniques before changing the dressing.  Do not use any lotions or creams on the incision until instructed by your surgeon. and You may shower 3 days after surgery, but keep the wounds dry during showering.  NO SOAKING/SUBMERGING IN THE BATHTUB.  If the bandage gets wet, change with a clean dry gauze.  If the incision gets wet, pat the wound dry with a  clean towel.  ACTIVITY  Increase activity slowly as tolerated, but follow the weight bearing instructions below.   No driving for 6 weeks or until further direction given by your physician.  You cannot drive while taking narcotics.  No lifting or carrying greater than 10 lbs. until further directed by your surgeon. Avoid periods of inactivity such as sitting longer than an hour when not asleep. This helps prevent blood clots.  You may return to work once you are authorized by your doctor.     WEIGHT BEARING   Weight bearing as tolerated with assist device (walker, cane, etc) as directed, use it as long as suggested by your surgeon or therapist, typically at least 4-6 weeks.   EXERCISES  Results after joint replacement surgery are often greatly improved when you follow the exercise, range of motion and muscle strengthening exercises prescribed by your doctor. Safety measures are also important to protect the joint from further injury. Any time any of these exercises cause you to have increased pain or swelling, decrease what you are doing until you are comfortable again and then slowly increase them. If you have problems or questions, call your caregiver or physical therapist for advice.   Rehabilitation is important following a joint replacement. After just a few days of immobilization, the muscles of the leg can become weakened and shrink (atrophy).  These exercises are designed to build up the tone and strength of the thigh and leg muscles and to  improve motion. Often times heat used for twenty to thirty minutes before working out will loosen up your tissues and help with improving the range of motion but do not use heat for the first two weeks following surgery (sometimes heat can increase post-operative swelling).   These exercises can be done on a training (exercise) mat, on the floor, on a table or on a bed. Use whatever works the best and is most comfortable for you.    Use music or  television while you are exercising so that the exercises are a pleasant break in your day. This will make your life better with the exercises acting as a break in your routine that you can look forward to.   Perform all exercises about fifteen times, three times per day or as directed.  You should exercise both the operative leg and the other leg as well.  Exercises include:   Quad Sets - Tighten up the muscle on the front of the thigh (Quad) and hold for 5-10 seconds.   Straight Leg Raises - With your knee straight (if you were given a brace, keep it on), lift the leg to 60 degrees, hold for 3 seconds, and slowly lower the leg.  Perform this exercise against resistance later as your leg gets stronger.  Leg Slides: Lying on your back, slowly slide your foot toward your buttocks, bending your knee up off the floor (only go as far as is comfortable). Then slowly slide your foot back down until your leg is flat on the floor again.  Angel Wings: Lying on your back spread your legs to the side as far apart as you can without causing discomfort.  Hamstring Strength:  Lying on your back, push your heel against the floor with your leg straight by tightening up the muscles of your buttocks.  Repeat, but this time bend your knee to a comfortable angle, and push your heel against the floor.  You may put a pillow under the heel to make it more comfortable if necessary.   A rehabilitation program following joint replacement surgery can speed recovery and prevent re-injury in the future due to weakened muscles. Contact your doctor or a physical therapist for more information on knee rehabilitation.    CONSTIPATION  Constipation is defined medically as fewer than three stools per week and severe constipation as less than one stool per week.  Even if you have a regular bowel pattern at home, your normal regimen is likely to be disrupted due to multiple reasons following surgery.  Combination of anesthesia,  postoperative narcotics, change in appetite and fluid intake all can affect your bowels.   YOU MUST use at least one of the following options; they are listed in order of increasing strength to get the job done.  They are all available over the counter, and you may need to use some, POSSIBLY even all of these options:    Drink plenty of fluids (prune juice may be helpful) and high fiber foods Colace 100 mg by mouth twice a day  Senokot for constipation as directed and as needed Dulcolax (bisacodyl), take with full glass of water  Miralax (polyethylene glycol) once or twice a day as needed.  If you have tried all these things and are unable to have a bowel movement in the first 3-4 days after surgery call either your surgeon or your primary doctor.    If you experience loose stools or diarrhea, hold the medications until you stool forms back  up.  If your symptoms do not get better within 1 week or if they get worse, check with your doctor.  If you experience "the worst abdominal pain ever" or develop nausea or vomiting, please contact the office immediately for further recommendations for treatment.   ITCHING:  If you experience itching with your medications, try taking only a single pain pill, or even half a pain pill at a time.  You can also use Benadryl over the counter for itching or also to help with sleep.   TED HOSE STOCKINGS:  Use stockings on both legs until for at least 2 weeks or as directed by physician office. They may be removed at night for sleeping.  MEDICATIONS:  See your medication summary on the "After Visit Summary" that nursing will review with you.  You may have some home medications which will be placed on hold until you complete the course of blood thinner medication.  It is important for you to complete the blood thinner medication as prescribed.  PRECAUTIONS:  If you experience chest pain or shortness of breath - call 911 immediately for transfer to the hospital emergency  department.   If you develop a fever greater that 101 F, purulent drainage from wound, increased redness or drainage from wound, foul odor from the wound/dressing, or calf pain - CONTACT YOUR SURGEON.                                                   FOLLOW-UP APPOINTMENTS:  If you do not already have a post-op appointment, please call the office for an appointment to be seen by your surgeon.  Guidelines for how soon to be seen are listed in your "After Visit Summary", but are typically between 1-4 weeks after surgery.  OTHER INSTRUCTIONS:   Take aspirin '325mg'$  twice daily to prevent blood clots   MAKE SURE YOU:  Understand these instructions.  Get help right away if you are not doing well or get worse.    Thank you for letting us be a part of your medical care team.  It is a privilege we respect greatly.  We hope these instructions will help you stay on track for a fast and full recovery!     Increase activity slowly as tolerated    Complete by:  As directed             Medication List    STOP taking these medications        acetaminophen 325 MG tablet  Commonly known as:  TYLENOL      TAKE these medications        aspirin EC 325 MG tablet  Take 1 tablet (325 mg total) by mouth 2 (two) times daily. to prevent blood clots     calcium-vitamin D 500-200 MG-UNIT tablet  Take 1 tablet by mouth daily.     HYDROcodone-acetaminophen 5-325 MG tablet  Commonly known as:  NORCO/VICODIN  Take 1-2 tablets by mouth every 4 (four) hours as needed (breakthrough pain).     methocarbamol 500 MG tablet  Commonly known as:  ROBAXIN  Take 1 tablet (500 mg total) by mouth every 6 (six) hours as needed for muscle spasms.     MULTIVITAMIN ADULTS 50+ Tabs  Take 1 tablet by mouth daily.     pravastatin 20 MG tablet  Commonly  known as:  PRAVACHOL  Take 1 tablet (20 mg total) by mouth daily.           Follow-up Information    Follow up with [facility].   Why:  home health  agency   Contact information:   [address] [place] [address] [phone]       Follow up with GIOFFRE,RONALD A, MD. Schedule an appointment as soon as possible for a visit in 2 weeks.   Specialty:  Orthopedic Surgery   Contact information:   [address] [place] [address] [phone]       Signed: Ardeen Jourdain, PA-C Orthopaedic Surgery 05/01/2016, 2:43 PM

## 2016-05-02 DIAGNOSIS — M1711 Unilateral primary osteoarthritis, right knee: Secondary | ICD-10-CM | POA: Diagnosis not present

## 2016-05-02 DIAGNOSIS — Z9181 History of falling: Secondary | ICD-10-CM | POA: Diagnosis not present

## 2016-05-02 DIAGNOSIS — Z471 Aftercare following joint replacement surgery: Secondary | ICD-10-CM | POA: Diagnosis not present

## 2016-05-02 DIAGNOSIS — Z7982 Long term (current) use of aspirin: Secondary | ICD-10-CM | POA: Diagnosis not present

## 2016-05-02 DIAGNOSIS — Z96652 Presence of left artificial knee joint: Secondary | ICD-10-CM | POA: Diagnosis not present

## 2016-05-02 DIAGNOSIS — E785 Hyperlipidemia, unspecified: Secondary | ICD-10-CM | POA: Diagnosis not present

## 2016-05-06 ENCOUNTER — Telehealth: Payer: Self-pay | Admitting: Physician Assistant

## 2016-05-06 NOTE — Telephone Encounter (Signed)
LMOM with contact name and number for return call RE: medication problem and further inquiry/SLS 07/24

## 2016-05-06 NOTE — Telephone Encounter (Signed)
Caller name: Levada Dy Relationship to patient: Daughter Can be reached: [phone]   Reason for call: Daughter states that the last cholesterol medication that was given to patient has her waking up with headaches. Plse adv

## 2016-05-07 DIAGNOSIS — Z471 Aftercare following joint replacement surgery: Secondary | ICD-10-CM | POA: Diagnosis not present

## 2016-05-07 DIAGNOSIS — Z96652 Presence of left artificial knee joint: Secondary | ICD-10-CM | POA: Diagnosis not present

## 2016-05-07 DIAGNOSIS — E785 Hyperlipidemia, unspecified: Secondary | ICD-10-CM | POA: Diagnosis not present

## 2016-05-07 DIAGNOSIS — Z7982 Long term (current) use of aspirin: Secondary | ICD-10-CM | POA: Diagnosis not present

## 2016-05-07 DIAGNOSIS — M1711 Unilateral primary osteoarthritis, right knee: Secondary | ICD-10-CM | POA: Diagnosis not present

## 2016-05-07 DIAGNOSIS — Z9181 History of falling: Secondary | ICD-10-CM | POA: Diagnosis not present

## 2016-05-08 DIAGNOSIS — Z7982 Long term (current) use of aspirin: Secondary | ICD-10-CM | POA: Diagnosis not present

## 2016-05-08 DIAGNOSIS — Z96652 Presence of left artificial knee joint: Secondary | ICD-10-CM | POA: Diagnosis not present

## 2016-05-08 DIAGNOSIS — M1711 Unilateral primary osteoarthritis, right knee: Secondary | ICD-10-CM | POA: Diagnosis not present

## 2016-05-08 DIAGNOSIS — Z9181 History of falling: Secondary | ICD-10-CM | POA: Diagnosis not present

## 2016-05-08 DIAGNOSIS — Z471 Aftercare following joint replacement surgery: Secondary | ICD-10-CM | POA: Diagnosis not present

## 2016-05-08 DIAGNOSIS — E785 Hyperlipidemia, unspecified: Secondary | ICD-10-CM | POA: Diagnosis not present

## 2016-05-08 NOTE — Telephone Encounter (Signed)
LMOM with contact name and number for return call RE: medication problem and further inquiry/SLS 07/26

## 2016-05-09 DIAGNOSIS — Z96652 Presence of left artificial knee joint: Secondary | ICD-10-CM | POA: Diagnosis not present

## 2016-05-09 DIAGNOSIS — Z471 Aftercare following joint replacement surgery: Secondary | ICD-10-CM | POA: Diagnosis not present

## 2016-05-09 NOTE — Telephone Encounter (Signed)
Called left message to call back 

## 2016-05-10 DIAGNOSIS — Z471 Aftercare following joint replacement surgery: Secondary | ICD-10-CM | POA: Diagnosis not present

## 2016-05-10 DIAGNOSIS — Z96652 Presence of left artificial knee joint: Secondary | ICD-10-CM | POA: Diagnosis not present

## 2016-05-10 DIAGNOSIS — Z9181 History of falling: Secondary | ICD-10-CM | POA: Diagnosis not present

## 2016-05-10 DIAGNOSIS — M1711 Unilateral primary osteoarthritis, right knee: Secondary | ICD-10-CM | POA: Diagnosis not present

## 2016-05-10 DIAGNOSIS — Z7982 Long term (current) use of aspirin: Secondary | ICD-10-CM | POA: Diagnosis not present

## 2016-05-10 DIAGNOSIS — E785 Hyperlipidemia, unspecified: Secondary | ICD-10-CM | POA: Diagnosis not present

## 2016-05-10 NOTE — Telephone Encounter (Signed)
Called the daughter informed of provider instructions. She did agree to try.

## 2016-05-10 NOTE — Telephone Encounter (Signed)
Called left message to call back 

## 2016-05-10 NOTE — Telephone Encounter (Signed)
The new cholesterol medication is causing headaches/dizziness and she has now stopped.  The daughter would like to know if ok to just not take prescription cholesterol medication and ok to take OTC fish, if ok how much?

## 2016-05-10 NOTE — Telephone Encounter (Signed)
Pt's daughter Janace Hoard called back in.

## 2016-05-10 NOTE — Telephone Encounter (Signed)
Why don't we try a 3 month trial off of medication -- working hard on limiting saturated fats and cholesterol. Increase exercise once through therapy.  Follow-up with me in 3 months.

## 2016-05-10 NOTE — Telephone Encounter (Signed)
Daughter would like a call back at [phone] to be advised

## 2016-05-13 ENCOUNTER — Telehealth: Payer: Self-pay | Admitting: Physician Assistant

## 2016-05-13 DIAGNOSIS — Z7982 Long term (current) use of aspirin: Secondary | ICD-10-CM | POA: Diagnosis not present

## 2016-05-13 DIAGNOSIS — Z471 Aftercare following joint replacement surgery: Secondary | ICD-10-CM | POA: Diagnosis not present

## 2016-05-13 DIAGNOSIS — E785 Hyperlipidemia, unspecified: Secondary | ICD-10-CM | POA: Diagnosis not present

## 2016-05-13 DIAGNOSIS — M1711 Unilateral primary osteoarthritis, right knee: Secondary | ICD-10-CM | POA: Diagnosis not present

## 2016-05-13 DIAGNOSIS — Z9181 History of falling: Secondary | ICD-10-CM | POA: Diagnosis not present

## 2016-05-13 DIAGNOSIS — Z96652 Presence of left artificial knee joint: Secondary | ICD-10-CM | POA: Diagnosis not present

## 2016-05-13 NOTE — Telephone Encounter (Signed)
Pt's daughter called in because she says that pt is feeling drained and tired. She would like to know if provider could look at pt's last labs to make sure that her levels are okay?     CB: [id number] Dy

## 2016-05-13 NOTE — Telephone Encounter (Signed)
Hospital visit was for surgery. She does not need follow-up with me for this issue. Needs follow-up 3 months for cholesterol

## 2016-05-13 NOTE — Telephone Encounter (Signed)
All of patient's most recent labs from 04/19/16 to 04/25/16 were done in the [facility], then she received a blood transfusion on 04/26/16. On 04/22/16, pt called from hospital RE: cholesterol medication, change was made; then another call RE: cholesterol medication that began on 05/06/16 and daughter was finally reached on 05/10/16 and agreed to have patient [mother] try provider's recommendation. Patient has yet to schedule Hospital F/U appointment. Please Advise/SLS 07/31

## 2016-05-14 DIAGNOSIS — Z471 Aftercare following joint replacement surgery: Secondary | ICD-10-CM | POA: Diagnosis not present

## 2016-05-14 DIAGNOSIS — Z96652 Presence of left artificial knee joint: Secondary | ICD-10-CM | POA: Diagnosis not present

## 2016-05-14 DIAGNOSIS — R2689 Other abnormalities of gait and mobility: Secondary | ICD-10-CM | POA: Diagnosis not present

## 2016-05-14 DIAGNOSIS — M25662 Stiffness of left knee, not elsewhere classified: Secondary | ICD-10-CM | POA: Diagnosis not present

## 2016-05-15 NOTE — Telephone Encounter (Addendum)
LMOM with contact name and number for return call RE: need to call office and schedule appointment per provider instructions. Spoke with daughter, Melanie Lee, afterwards and she will call back tomorrow and schedule appt for patient [mother]/SLS 08/02

## 2016-05-15 NOTE — Telephone Encounter (Signed)
Called to schedule pt a 3 month fu . Pt's daughter says that they will call in to schedule 3 months, they have a lot going on right now. She says that pt is still having headaches. Daughter would like for her to have a CT scan to find out whats going on.    Pt's daughter is requesting a call back from someone (clinical) directly at the phone number below.    CB: [phone]

## 2016-05-17 DIAGNOSIS — R2689 Other abnormalities of gait and mobility: Secondary | ICD-10-CM | POA: Diagnosis not present

## 2016-05-17 DIAGNOSIS — Z471 Aftercare following joint replacement surgery: Secondary | ICD-10-CM | POA: Diagnosis not present

## 2016-05-17 DIAGNOSIS — M25662 Stiffness of left knee, not elsewhere classified: Secondary | ICD-10-CM | POA: Diagnosis not present

## 2016-05-17 DIAGNOSIS — Z96652 Presence of left artificial knee joint: Secondary | ICD-10-CM | POA: Diagnosis not present

## 2016-05-20 ENCOUNTER — Ambulatory Visit (INDEPENDENT_AMBULATORY_CARE_PROVIDER_SITE_OTHER): Payer: BLUE CROSS/BLUE SHIELD | Admitting: Physician Assistant

## 2016-05-20 ENCOUNTER — Encounter: Payer: Self-pay | Admitting: Physician Assistant

## 2016-05-20 VITALS — BP 110/66 | HR 84 | Temp 98.2°F | Resp 16 | Ht 64.0 in | Wt 159.5 lb

## 2016-05-20 DIAGNOSIS — Z471 Aftercare following joint replacement surgery: Secondary | ICD-10-CM | POA: Diagnosis not present

## 2016-05-20 DIAGNOSIS — D473 Essential (hemorrhagic) thrombocythemia: Secondary | ICD-10-CM | POA: Diagnosis not present

## 2016-05-20 DIAGNOSIS — R2689 Other abnormalities of gait and mobility: Secondary | ICD-10-CM | POA: Diagnosis not present

## 2016-05-20 DIAGNOSIS — M25662 Stiffness of left knee, not elsewhere classified: Secondary | ICD-10-CM | POA: Diagnosis not present

## 2016-05-20 DIAGNOSIS — R51 Headache: Secondary | ICD-10-CM

## 2016-05-20 DIAGNOSIS — D75839 Thrombocytosis, unspecified: Secondary | ICD-10-CM

## 2016-05-20 DIAGNOSIS — R519 Headache, unspecified: Secondary | ICD-10-CM

## 2016-05-20 DIAGNOSIS — Z96652 Presence of left artificial knee joint: Secondary | ICD-10-CM | POA: Diagnosis not present

## 2016-05-20 LAB — BASIC METABOLIC PANEL
BUN: 11 mg/dL (ref 6–23)
CO2: 27 mEq/L (ref 19–32)
Calcium: 10.1 mg/dL (ref 8.4–10.5)
Chloride: 103 mEq/L (ref 96–112)
Creatinine, Ser: 0.62 mg/dL (ref 0.40–1.20)
GFR: 123.07 mL/min (ref >60.00)
Glucose, Bld: 95 mg/dL (ref 70–99)
Potassium: 4.4 mEq/L (ref 3.5–5.1)
Sodium: 140 mEq/L (ref 135–145)

## 2016-05-20 LAB — CBC
HCT: 37.3 % (ref 36.0–46.0)
Hemoglobin: 12.1 g/dL (ref 12.0–15.0)
MCHC: 32.3 g/dL (ref 30.0–36.0)
MCV: 89.6 fl (ref 78.0–100.0)
Platelets: 547 10*3/uL — ABNORMAL HIGH (ref 150.0–400.0)
RBC: 4.17 Mil/uL (ref 3.87–5.11)
RDW: 13.9 % (ref 11.5–15.5)
WBC: 8.1 10*3/uL (ref 4.0–10.5)

## 2016-05-20 LAB — TSH: TSH: 2.36 u[IU]/mL (ref 0.35–4.50)

## 2016-05-20 NOTE — Patient Instructions (Signed)
Please go to the lab for blood work. I will call you with the results.  Restart you Flonase, 2 sprays in each nostril once daily. Stay well hydrated and eat a well-balanced diet.  Try to limit use of hydrocodone, only when you have severe pain. Hydrocodone can cause rebound headaches and may be the reason why your symptoms have worsened since her surgery. You can use Tylenol in place of hydrocodone, but do not take both together at the same time.  I encourage you to increase hydration and the amount of fiber in your diet.  Start a daily probiotic (Align, Culturelle, Digestive Advantage, etc.). If no bowel movement within 24 hours, take 2 Tbs of Milk of Magnesia in a 4 oz glass of warmed prune juice every 2-3 days to help promote bowel movement. If no results within 24 hours, then repeat above regimen, adding a Dulcolax stool softener to regimen. If this does not promote a bowel movement, please call the office.  Follow-up with me in 1 week. If you notice any new or worsening symptoms, please return sooner or seek care at an ER facility if symptoms are severe.

## 2016-05-20 NOTE — Progress Notes (Signed)
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 Headaches are described as mostly frontal and aching, without aura, photophobia, phonophobia, nausea or vomiting. Patient notes that is sometimes associated dizziness, described as feeling "off balance". Denies true vertigo.  Patient is trying to stay well hydrated. Has decreased food intake, and she does not feel hungry. Is taking hydrocodone 3 times a day for knee pain s/p surgery. Patient denies chest pain, palpitations or shortness of breath. Headaches previously thought to be related to cholesterol medication. She has been off of all medication for one week with continued intermittent headaches. Patient also endorses alternating feeling hot and cold. Denies chills or sweats. Denies body aches, fever or malaise. Does note fatigue, mostly after physical therapy sessions.   Past Medical History:  Diagnosis Date  . Elevated blood pressure (not hypertension)    "elevates sometimes with MD visits"  . Frequent headaches    04-19-16 "no longer bothered"- since no cholesterol med   . 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 aspirin EC 325 MG tablet Take 1 tablet (325 mg total) by mouth 2 (two) times daily. to prevent blood clots 30 tablet 0  . Calcium Carbonate-Vitamin D (CALCIUM-VITAMIN D) 500-200 MG-UNIT tablet Take 1 tablet by mouth daily.    Marland Kitchen HYDROcodone-acetaminophen (NORCO/VICODIN) 5-325 MG tablet Take 1-2 tablets by mouth every 4 (four) hours as needed (breakthrough pain). 80 tablet 0  . methocarbamol (ROBAXIN) 500 MG tablet Take 1 tablet (500 mg total) by mouth every 6 (six) hours as needed for muscle spasms. 40 tablet 1  . Multiple Vitamins-Minerals (MULTIVITAMIN ADULTS 50+) TABS Take 1 tablet by mouth daily.       . pravastatin (PRAVACHOL) 20 MG tablet Take 1 tablet (20 mg total) by mouth daily. 30 tablet 0   No current facility-administered medications on file prior to visit.     Allergies  Allergen Reactions  . Tramadol Nausea Only and Other (See Comments)    Dizziness    Family History  Problem Relation Age of Onset  . Breast cancer Mother 58    Deceased  . Heart disease Mother   . Hypertension Mother   . Hypertension Father     Deceased  . Stroke Father   . Hypertension Sister   . Hypertension Brother   . Heart defect Daughter     Social History   Social History  . Marital status: Single    Spouse name: N/A  . Number of children: N/A  . Years of education: N/A   Social History Main Topics  . Smoking status: Never Smoker  . Smokeless tobacco: Never Used  . Alcohol use 0.0 oz/week     Comment: rare  . Drug use: No  . Sexual activity: Not Currently   Other Topics Concern  . 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   Review of Systems - See HPI.  All other ROS are negative.  BP 110/66 (BP Location: Left Arm, Patient Position: Sitting, Cuff Size: Normal)   Pulse 84   Temp 98.2 F (36.8 C) (Oral)   Resp 16   Ht 5' 4"  (1.626 m)   Wt 159 lb 8 oz (72.3 kg)   SpO2 98%   BMI 27.38 kg/m  Physical Exam  Constitutional: She is oriented to person, place, and time and well-developed, well-nourished, and in no distress.  HENT:  Head: Normocephalic and atraumatic.  Scant clear fluid noted behind tympanic membranes bilaterally. Mild retraction of TMs negative bilaterally. Negative tenderness to percussion of sinuses. Negative Dix-Hallpike maneuver.  Eyes: Conjunctivae are normal.  Neck: Neck supple.  Cardiovascular: Normal rate, regular rhythm, normal heart sounds and intact distal pulses.   Pulmonary/Chest: Effort normal and breath sounds normal. No respiratory distress. She has no wheezes. She has no rales. She exhibits no tenderness.   Neurological: She is alert and oriented to person, place, and time. No cranial nerve deficit. Coordination normal.  Skin: Skin is warm and dry. No rash noted.  Psychiatric: Affect normal.  Vitals reviewed.   Recent Results (from the past 2160 hour(s))  Lipid panel     Status: None   Collection Time: 04/01/16  8:55 AM  Result Value Ref Range   Cholesterol 166 0 - 200 mg/dL    Comment: ATP III Classification       Desirable:  < 200 mg/dL               Borderline High:  200 - 239 mg/dL          High:  > = 240 mg/dL   Triglycerides 98.0 0.0 - 149.0 mg/dL    Comment: Normal:  <150 mg/dLBorderline High:  150 - 199 mg/dL   HDL 55.50 >39.00 mg/dL   VLDL 19.6 0.0 - 40.0 mg/dL   LDL Cholesterol 91 0 - 99 mg/dL   Total CHOL/HDL Ratio 3     Comment:                Men          Women1/2 Average Risk     3.4          3.3Average Risk          5.0          4.42X Average Risk          9.6          7.13X Average Risk          15.0          11.0                       NonHDL 110.50     Comment: NOTE:  Non-HDL goal should be 30 mg/dL higher than patient's LDL goal (i.e. LDL goal of < 70 mg/dL, would have non-HDL goal of < 100 mg/dL)  Comp Met (CMET)     Status: None   Collection Time: 04/01/16  8:55 AM  Result Value Ref Range   Sodium 139 135 - 145 mEq/L   Potassium 3.9 3.5 - 5.1 mEq/L   Chloride 105 96 - 112 mEq/L   CO2 23 19 - 32 mEq/L   Glucose, Bld 98 70 - 99 mg/dL   BUN 11 6 - 23 mg/dL   Creatinine, Ser 0.61 0.40 - 1.20 mg/dL   Total Bilirubin 0.5 0.2 - 1.2 mg/dL   Alkaline Phosphatase 87 39 - 117 U/L   AST 20 0 - 37 U/L   ALT 13 0 - 35 U/L   Total Protein 8.3 6.0 - 8.3 g/dL   Albumin 4.5 3.5 - 5.2 g/dL   Calcium 9.8 8.4 - 10.5 mg/dL   GFR 125.45 >60.00 mL/min  INR/PT     Status: Abnormal  Collection Time: 04/01/16  8:55 AM  Result Value Ref Range   INR 1.1 (H) 0.8 - 1.0 ratio   Prothrombin Time 11.8 9.6 - 13.1 sec  Surgical pcr screen     Status: None   Collection Time: 04/19/16  11:00 AM  Result Value Ref Range   MRSA, PCR NEGATIVE NEGATIVE   Staphylococcus aureus NEGATIVE NEGATIVE    Comment:        The Xpert SA Assay (FDA approved for NASAL specimens in patients over 75 years of age), is one component of a comprehensive surveillance program.  Test performance has been validated by [facility] for patients greater than or equal to 33 year old. It is not intended to diagnose infection nor to guide or monitor treatment.   Urinalysis, Routine w reflex microscopic (not at [facility])     Status: Abnormal   Collection Time: 04/19/16 11:00 AM  Result Value Ref Range   Color, Urine YELLOW YELLOW   APPearance CLEAR CLEAR   Specific Gravity, Urine 1.023 1.005 - 1.030   pH 5.0 5.0 - 8.0   Glucose, UA NEGATIVE NEGATIVE mg/dL   Hgb urine dipstick TRACE (A) NEGATIVE   Bilirubin Urine NEGATIVE NEGATIVE   Ketones, ur NEGATIVE NEGATIVE mg/dL   Protein, ur NEGATIVE NEGATIVE mg/dL   Nitrite NEGATIVE NEGATIVE   Leukocytes, UA NEGATIVE NEGATIVE  Urine microscopic-add on     Status: Abnormal   Collection Time: 04/19/16 11:00 AM  Result Value Ref Range   Squamous Epithelial / LPF 0-5 (A) NONE SEEN   WBC, UA 0-5 0 - 5 WBC/hpf   RBC / HPF 0-5 0 - 5 RBC/hpf   Bacteria, UA FEW (A) NONE SEEN  Type and screen Order type and screen if day of surgery is less than 15 days from draw of preadmission visit or order morning of surgery if day of surgery is greater than 6 days from preadmission visit.     Status: None   Collection Time: 04/19/16 12:10 PM  Result Value Ref Range   ABO/RH(D) O POS    Antibody Screen NEG    Sample Expiration 04/26/2016    Extend sample reason NO TRANSFUSIONS OR PREGNANCY IN THE PAST 3 MONTHS   APTT     Status: None   Collection Time: 04/19/16 12:30 PM  Result Value Ref Range   aPTT 31 24 - 37 seconds  CBC WITH DIFFERENTIAL     Status: None   Collection Time: 04/19/16 12:30 PM  Result Value Ref Range   WBC 8.9 4.0 - 10.5 K/uL   RBC 4.74 3.87 - 5.11  MIL/uL   Hemoglobin 13.6 12.0 - 15.0 g/dL   HCT 41.2 36.0 - 46.0 %   MCV 86.9 78.0 - 100.0 fL   MCH 28.7 26.0 - 34.0 pg   MCHC 33.0 30.0 - 36.0 g/dL   RDW 12.4 11.5 - 15.5 %   Platelets 322 150 - 400 K/uL   Neutrophils Relative % 71 %   Neutro Abs 6.3 1.7 - 7.7 K/uL   Lymphocytes Relative 24 %   Lymphs Abs 2.1 0.7 - 4.0 K/uL   Monocytes Relative 5 %   Monocytes Absolute 0.5 0.1 - 1.0 K/uL   Eosinophils Relative 0 %   Eosinophils Absolute 0.0 0.0 - 0.7 K/uL   Basophils Relative 0 %   Basophils Absolute 0.0 0.0 - 0.1 K/uL  Protime-INR     Status: None   Collection Time: 04/19/16 12:30 PM  Result Value Ref Range  Prothrombin Time 13.7 11.6 - 15.2 seconds   INR 1.03 0.00 - 1.49  ABO/Rh     Status: None   Collection Time: 04/19/16 12:30 PM  Result Value Ref Range   ABO/RH(D) O POS   CBC     Status: Abnormal   Collection Time: 04/24/16  4:14 AM  Result Value Ref Range   WBC 19.3 (H) 4.0 - 10.5 K/uL   RBC 4.52 3.87 - 5.11 MIL/uL   Hemoglobin 13.1 12.0 - 15.0 g/dL   HCT 39.8 36.0 - 46.0 %   MCV 88.1 78.0 - 100.0 fL   MCH 29.0 26.0 - 34.0 pg   MCHC 32.9 30.0 - 36.0 g/dL   RDW 12.2 11.5 - 15.5 %   Platelets 328 150 - 400 K/uL  Basic metabolic panel     Status: Abnormal   Collection Time: 04/24/16  4:14 AM  Result Value Ref Range   Sodium 135 135 - 145 mmol/L   Potassium 3.9 3.5 - 5.1 mmol/L   Chloride 100 (L) 101 - 111 mmol/L   CO2 26 22 - 32 mmol/L   Glucose, Bld 135 (H) 65 - 99 mg/dL   BUN 9 6 - 20 mg/dL   Creatinine, Ser 0.51 0.44 - 1.00 mg/dL   Calcium 9.2 8.9 - 10.3 mg/dL   GFR calc non Af Amer >60 >60 mL/min   GFR calc Af Amer >60 >60 mL/min    Comment: (NOTE) The eGFR has been calculated using the CKD EPI equation. This calculation has not been validated in all clinical situations. eGFR's persistently <60 mL/min signify possible Chronic Kidney Disease.    Anion gap 9 5 - 15  CBC     Status: Abnormal   Collection Time: 04/25/16  4:12 AM  Result Value Ref  Range   WBC 16.4 (H) 4.0 - 10.5 K/uL   RBC 3.99 3.87 - 5.11 MIL/uL   Hemoglobin 11.6 (L) 12.0 - 15.0 g/dL   HCT 35.7 (L) 36.0 - 46.0 %   MCV 89.5 78.0 - 100.0 fL   MCH 29.1 26.0 - 34.0 pg   MCHC 32.5 30.0 - 36.0 g/dL   RDW 12.5 11.5 - 15.5 %   Platelets 308 150 - 400 K/uL  Basic metabolic panel     Status: Abnormal   Collection Time: 04/25/16  4:12 AM  Result Value Ref Range   Sodium 135 135 - 145 mmol/L   Potassium 4.0 3.5 - 5.1 mmol/L   Chloride 100 (L) 101 - 111 mmol/L   CO2 29 22 - 32 mmol/L   Glucose, Bld 118 (H) 65 - 99 mg/dL   BUN 11 6 - 20 mg/dL   Creatinine, Ser 0.64 0.44 - 1.00 mg/dL   Calcium 8.6 (L) 8.9 - 10.3 mg/dL   GFR calc non Af Amer >60 >60 mL/min   GFR calc Af Amer >60 >60 mL/min    Comment: (NOTE) The eGFR has been calculated using the CKD EPI equation. This calculation has not been validated in all clinical situations. eGFR's persistently <60 mL/min signify possible Chronic Kidney Disease.    Anion gap 6 5 - 15    Assessment/Plan: 1. New onset of headaches Occurring once every few days, lasting < 1 hours if taking medication. Suspect multifactorial. There is evidence of fluid behind eardrums, without sign of infection. This could definitely be contributing to episodes of feeling "off balance". She has also had decrease in intake. She has recently had a total knee replacement and is  on narcotic pain medicine 3 times daily, which has the potential cause rebound headaches. Examination is relatively unremarkable otherwise. Status exam negative, but there is some decrease in blood pressure from lying to sitting noted. Will obtain lab panel today to assess electrolytes, thyroid function and blood count giving risk for anemia status post surgery. Discussed adequate hydration and food intake. Will start daily Flonase to remove fluid from behind ears. She is to try to reduce use of hydrocodone when possible, taking Tylenol instead, to prevent drug induced headache.  Follow-up in one week.  - CBC - Basic metabolic panel - TSH   Leeanne Rio, PA-C

## 2016-05-21 NOTE — Addendum Note (Signed)
Addended by: Dorrene German on: 05/21/2016 08:15 AM   Modules accepted: Orders

## 2016-05-22 DIAGNOSIS — R2689 Other abnormalities of gait and mobility: Secondary | ICD-10-CM | POA: Diagnosis not present

## 2016-05-22 DIAGNOSIS — Z96652 Presence of left artificial knee joint: Secondary | ICD-10-CM | POA: Diagnosis not present

## 2016-05-22 DIAGNOSIS — Z471 Aftercare following joint replacement surgery: Secondary | ICD-10-CM | POA: Diagnosis not present

## 2016-05-22 DIAGNOSIS — M25662 Stiffness of left knee, not elsewhere classified: Secondary | ICD-10-CM | POA: Diagnosis not present

## 2016-05-24 ENCOUNTER — Ambulatory Visit (INDEPENDENT_AMBULATORY_CARE_PROVIDER_SITE_OTHER): Payer: BLUE CROSS/BLUE SHIELD | Admitting: Physician Assistant

## 2016-05-24 ENCOUNTER — Encounter: Payer: Self-pay | Admitting: Physician Assistant

## 2016-05-24 VITALS — BP 125/70 | HR 86 | Temp 98.4°F | Resp 16 | Ht 64.0 in | Wt 158.5 lb

## 2016-05-24 DIAGNOSIS — Z471 Aftercare following joint replacement surgery: Secondary | ICD-10-CM | POA: Diagnosis not present

## 2016-05-24 DIAGNOSIS — D75839 Thrombocytosis, unspecified: Secondary | ICD-10-CM

## 2016-05-24 DIAGNOSIS — M25662 Stiffness of left knee, not elsewhere classified: Secondary | ICD-10-CM | POA: Diagnosis not present

## 2016-05-24 DIAGNOSIS — Z96652 Presence of left artificial knee joint: Secondary | ICD-10-CM | POA: Diagnosis not present

## 2016-05-24 DIAGNOSIS — R2689 Other abnormalities of gait and mobility: Secondary | ICD-10-CM | POA: Diagnosis not present

## 2016-05-24 DIAGNOSIS — D473 Essential (hemorrhagic) thrombocythemia: Secondary | ICD-10-CM

## 2016-05-24 LAB — COMPREHENSIVE METABOLIC PANEL
ALT: 10 U/L (ref 0–35)
AST: 18 U/L (ref 0–37)
Albumin: 4.3 g/dL (ref 3.5–5.2)
Alkaline Phosphatase: 87 U/L (ref 39–117)
BUN: 9 mg/dL (ref 6–23)
CO2: 27 mEq/L (ref 19–32)
Calcium: 10.3 mg/dL (ref 8.4–10.5)
Chloride: 104 mEq/L (ref 96–112)
Creatinine, Ser: 0.53 mg/dL (ref 0.40–1.20)
GFR: 147.49 mL/min (ref >60.00)
Glucose, Bld: 103 mg/dL — ABNORMAL HIGH (ref 70–99)
Potassium: 3.7 mEq/L (ref 3.5–5.1)
Sodium: 139 mEq/L (ref 135–145)
Total Bilirubin: 0.4 mg/dL (ref 0.2–1.2)
Total Protein: 8.4 g/dL — ABNORMAL HIGH (ref 6.0–8.3)

## 2016-05-24 LAB — CBC
HCT: 37.8 % (ref 36.0–46.0)
Hemoglobin: 12.5 g/dL (ref 12.0–15.0)
MCHC: 33 g/dL (ref 30.0–36.0)
MCV: 88 fl (ref 78.0–100.0)
Platelets: 506 10*3/uL — ABNORMAL HIGH (ref 150.0–400.0)
RBC: 4.3 Mil/uL (ref 3.87–5.11)
RDW: 13.9 % (ref 11.5–15.5)
WBC: 8.7 10*3/uL (ref 4.0–10.5)

## 2016-05-24 NOTE — Patient Instructions (Addendum)
Please go to the lab for blood work. I will call with results. I am glad you are feeling better. I am hopeful that platelet count is improving.  Keep up with good hydration and a well-balanced diet. Use Flonase daily over the next week, then PRN.  Follow-up with your specialist as directed.

## 2016-05-24 NOTE — Progress Notes (Signed)
Patient presents to clinic today c/o for follow-up of headaches and thrombocytosis, noted on recent labs. Patient endorses doing very well overall. Did had one headache yesterday lasting for an hour, but otherwise has had no recurrence of headaches. Denies ear pressure, sinus symptoms, nausea, vomiting or photophobia. Is using Flonase as directed for Eustachian tube dysfunction. Is due for repeat CBC to assess platelet count.  Past Medical History:  Diagnosis Date  . Elevated blood pressure (not hypertension)    "elevates sometimes with MD visits"  . Frequent headaches    04-19-16 "no longer bothered"- since no cholesterol med   . 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 aspirin EC 325 MG tablet Take 1 tablet (325 mg total) by mouth 2 (two) times daily. to prevent blood clots 30 tablet 0  . Calcium Carbonate-Vitamin D (CALCIUM-VITAMIN D) 500-200 MG-UNIT tablet Take 1 tablet by mouth daily.    Marland Kitchen HYDROcodone-acetaminophen (NORCO/VICODIN) 5-325 MG tablet Take 1-2 tablets by mouth every 4 (four) hours as needed (breakthrough pain). 80 tablet 0  . meclizine (ANTIVERT) 25 MG tablet Take 25 mg by mouth 3 (three) times daily as needed for dizziness.    . methocarbamol (ROBAXIN) 500 MG tablet Take 1 tablet (500 mg total) by mouth every 6 (six) hours as needed for muscle spasms. 40 tablet 1  . Multiple Vitamins-Minerals (MULTIVITAMIN ADULTS 50+) TABS Take 1 tablet by mouth daily.     . pravastatin (PRAVACHOL) 20 MG tablet Take 1 tablet (20 mg total) by mouth daily. 30 tablet 0   No current facility-administered medications on file prior to visit.     Allergies  Allergen Reactions  . Tramadol Nausea Only and Other (See Comments)    Dizziness    Family History  Problem Relation Age of Onset  . Breast cancer Mother 15    Deceased  . Heart disease Mother   .  Hypertension Mother   . Hypertension Father     Deceased  . Stroke Father   . Hypertension Sister   . Hypertension Brother   . Heart defect Daughter     Social History   Social History  . Marital status: Single    Spouse name: N/A  . Number of children: N/A  . Years of education: N/A   Social History Main Topics  . Smoking status: Never Smoker  . Smokeless tobacco: Never Used  . Alcohol use 0.0 oz/week     Comment: rare  . Drug use: No  . Sexual activity: Not Currently   Other Topics Concern  . 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    Review of Systems - See HPI.  All other ROS are negative.  BP (!) 0/0 (BP Location: Left Arm, Patient Position: Sitting, Cuff Size: Normal)   Ht 5' 4" (1.626 m)   Wt 158 lb 8 oz (71.9 kg)   BMI 27.21 kg/m   Physical Exam  Constitutional: She is oriented to person, place, and time and well-developed, well-nourished, and in no distress.  HENT:  Head: Normocephalic and atraumatic.  Eyes: Conjunctivae are normal.  Neck: Neck supple.  Cardiovascular: Normal rate, regular rhythm, normal heart sounds and intact distal pulses.   Pulmonary/Chest: Effort normal and breath sounds normal.  Neurological: She is alert and oriented to person, place, and time.  Skin:  Skin is warm and dry.  Psychiatric: Affect normal.  Vitals reviewed.   Recent Results (from the past 2160 hour(s))  Lipid panel     Status: None   Collection Time: 04/01/16  8:55 AM  Result Value Ref Range   Cholesterol 166 0 - 200 mg/dL    Comment: ATP III Classification       Desirable:  < 200 mg/dL               Borderline High:  200 - 239 mg/dL          High:  > = 240 mg/dL   Triglycerides 98.0 0.0 - 149.0 mg/dL    Comment: Normal:  <150 mg/dLBorderline High:  150 - 199 mg/dL   HDL 55.50 >39.00 mg/dL   VLDL 19.6 0.0 - 40.0 mg/dL   LDL Cholesterol 91 0 - 99 mg/dL   Total CHOL/HDL Ratio 3     Comment:                Men          Women1/2 Average  Risk     3.4          3.3Average Risk          5.0          4.42X Average Risk          9.6          7.13X Average Risk          15.0          11.0                       NonHDL 110.50     Comment: NOTE:  Non-HDL goal should be 30 mg/dL higher than patient's LDL goal (i.e. LDL goal of < 70 mg/dL, would have non-HDL goal of < 100 mg/dL)  Comp Met (CMET)     Status: None   Collection Time: 04/01/16  8:55 AM  Result Value Ref Range   Sodium 139 135 - 145 mEq/L   Potassium 3.9 3.5 - 5.1 mEq/L   Chloride 105 96 - 112 mEq/L   CO2 23 19 - 32 mEq/L   Glucose, Bld 98 70 - 99 mg/dL   BUN 11 6 - 23 mg/dL   Creatinine, Ser 0.61 0.40 - 1.20 mg/dL   Total Bilirubin 0.5 0.2 - 1.2 mg/dL   Alkaline Phosphatase 87 39 - 117 U/L   AST 20 0 - 37 U/L   ALT 13 0 - 35 U/L   Total Protein 8.3 6.0 - 8.3 g/dL   Albumin 4.5 3.5 - 5.2 g/dL   Calcium 9.8 8.4 - 10.5 mg/dL   GFR 125.45 >60.00 mL/min  INR/PT     Status: Abnormal   Collection Time: 04/01/16  8:55 AM  Result Value Ref Range   INR 1.1 (H) 0.8 - 1.0 ratio   Prothrombin Time 11.8 9.6 - 13.1 sec  Surgical pcr screen     Status: None   Collection Time: 04/19/16 11:00 AM  Result Value Ref Range   MRSA, PCR NEGATIVE NEGATIVE   Staphylococcus aureus NEGATIVE NEGATIVE    Comment:        The Xpert SA Assay (FDA approved for NASAL specimens in patients over 18 years of age), is one component of a comprehensive surveillance program.  Test performance has been validated by [facility] for patients greater than or equal to 44 year old.  It is not intended to diagnose infection nor to guide or monitor treatment.   Urinalysis, Routine w reflex microscopic (not at [facility])     Status: Abnormal   Collection Time: 04/19/16 11:00 AM  Result Value Ref Range   Color, Urine YELLOW YELLOW   APPearance CLEAR CLEAR   Specific Gravity, Urine 1.023 1.005 - 1.030   pH 5.0 5.0 - 8.0   Glucose, UA NEGATIVE NEGATIVE mg/dL   Hgb urine dipstick TRACE (A) NEGATIVE    Bilirubin Urine NEGATIVE NEGATIVE   Ketones, ur NEGATIVE NEGATIVE mg/dL   Protein, ur NEGATIVE NEGATIVE mg/dL   Nitrite NEGATIVE NEGATIVE   Leukocytes, UA NEGATIVE NEGATIVE  Urine microscopic-add on     Status: Abnormal   Collection Time: 04/19/16 11:00 AM  Result Value Ref Range   Squamous Epithelial / LPF 0-5 (A) NONE SEEN   WBC, UA 0-5 0 - 5 WBC/hpf   RBC / HPF 0-5 0 - 5 RBC/hpf   Bacteria, UA FEW (A) NONE SEEN  Type and screen Order type and screen if day of surgery is less than 15 days from draw of preadmission visit or order morning of surgery if day of surgery is greater than 6 days from preadmission visit.     Status: None   Collection Time: 04/19/16 12:10 PM  Result Value Ref Range   ABO/RH(D) O POS    Antibody Screen NEG    Sample Expiration 04/26/2016    Extend sample reason NO TRANSFUSIONS OR PREGNANCY IN THE PAST 3 MONTHS   APTT     Status: None   Collection Time: 04/19/16 12:30 PM  Result Value Ref Range   aPTT 31 24 - 37 seconds  CBC WITH DIFFERENTIAL     Status: None   Collection Time: 04/19/16 12:30 PM  Result Value Ref Range   WBC 8.9 4.0 - 10.5 K/uL   RBC 4.74 3.87 - 5.11 MIL/uL   Hemoglobin 13.6 12.0 - 15.0 g/dL   HCT 41.2 36.0 - 46.0 %   MCV 86.9 78.0 - 100.0 fL   MCH 28.7 26.0 - 34.0 pg   MCHC 33.0 30.0 - 36.0 g/dL   RDW 12.4 11.5 - 15.5 %   Platelets 322 150 - 400 K/uL   Neutrophils Relative % 71 %   Neutro Abs 6.3 1.7 - 7.7 K/uL   Lymphocytes Relative 24 %   Lymphs Abs 2.1 0.7 - 4.0 K/uL   Monocytes Relative 5 %   Monocytes Absolute 0.5 0.1 - 1.0 K/uL   Eosinophils Relative 0 %   Eosinophils Absolute 0.0 0.0 - 0.7 K/uL   Basophils Relative 0 %   Basophils Absolute 0.0 0.0 - 0.1 K/uL  Protime-INR     Status: None   Collection Time: 04/19/16 12:30 PM  Result Value Ref Range   Prothrombin Time 13.7 11.6 - 15.2 seconds   INR 1.03 0.00 - 1.49  ABO/Rh     Status: None   Collection Time: 04/19/16 12:30 PM  Result Value Ref Range   ABO/RH(D) O POS    CBC     Status: Abnormal   Collection Time: 04/24/16  4:14 AM  Result Value Ref Range   WBC 19.3 (H) 4.0 - 10.5 K/uL   RBC 4.52 3.87 - 5.11 MIL/uL   Hemoglobin 13.1 12.0 - 15.0 g/dL   HCT 39.8 36.0 - 46.0 %   MCV 88.1 78.0 - 100.0 fL   MCH 29.0 26.0 - 34.0 pg   MCHC 32.9 30.0 -  36.0 g/dL   RDW 12.2 11.5 - 15.5 %   Platelets 328 150 - 400 K/uL  Basic metabolic panel     Status: Abnormal   Collection Time: 04/24/16  4:14 AM  Result Value Ref Range   Sodium 135 135 - 145 mmol/L   Potassium 3.9 3.5 - 5.1 mmol/L   Chloride 100 (L) 101 - 111 mmol/L   CO2 26 22 - 32 mmol/L   Glucose, Bld 135 (H) 65 - 99 mg/dL   BUN 9 6 - 20 mg/dL   Creatinine, Ser 0.51 0.44 - 1.00 mg/dL   Calcium 9.2 8.9 - 10.3 mg/dL   GFR calc non Af Amer >60 >60 mL/min   GFR calc Af Amer >60 >60 mL/min    Comment: (NOTE) The eGFR has been calculated using the CKD EPI equation. This calculation has not been validated in all clinical situations. eGFR's persistently <60 mL/min signify possible Chronic Kidney Disease.    Anion gap 9 5 - 15  CBC     Status: Abnormal   Collection Time: 04/25/16  4:12 AM  Result Value Ref Range   WBC 16.4 (H) 4.0 - 10.5 K/uL   RBC 3.99 3.87 - 5.11 MIL/uL   Hemoglobin 11.6 (L) 12.0 - 15.0 g/dL   HCT 35.7 (L) 36.0 - 46.0 %   MCV 89.5 78.0 - 100.0 fL   MCH 29.1 26.0 - 34.0 pg   MCHC 32.5 30.0 - 36.0 g/dL   RDW 12.5 11.5 - 15.5 %   Platelets 308 150 - 400 K/uL  Basic metabolic panel     Status: Abnormal   Collection Time: 04/25/16  4:12 AM  Result Value Ref Range   Sodium 135 135 - 145 mmol/L   Potassium 4.0 3.5 - 5.1 mmol/L   Chloride 100 (L) 101 - 111 mmol/L   CO2 29 22 - 32 mmol/L   Glucose, Bld 118 (H) 65 - 99 mg/dL   BUN 11 6 - 20 mg/dL   Creatinine, Ser 0.64 0.44 - 1.00 mg/dL   Calcium 8.6 (L) 8.9 - 10.3 mg/dL   GFR calc non Af Amer >60 >60 mL/min   GFR calc Af Amer >60 >60 mL/min    Comment: (NOTE) The eGFR has been calculated using the CKD EPI equation. This  calculation has not been validated in all clinical situations. eGFR's persistently <60 mL/min signify possible Chronic Kidney Disease.    Anion gap 6 5 - 15  CBC     Status: Abnormal   Collection Time: 05/20/16  8:10 AM  Result Value Ref Range   WBC 8.1 4.0 - 10.5 K/uL   RBC 4.17 3.87 - 5.11 Mil/uL   Platelets 547.0 (H) 150.0 - 400.0 K/uL   Hemoglobin 12.1 12.0 - 15.0 g/dL   HCT 37.3 36.0 - 46.0 %   MCV 89.6 78.0 - 100.0 fl   MCHC 32.3 30.0 - 36.0 g/dL   RDW 13.9 11.5 - 41.6 %  Basic metabolic panel     Status: None   Collection Time: 05/20/16  8:10 AM  Result Value Ref Range   Sodium 140 135 - 145 mEq/L   Potassium 4.4 3.5 - 5.1 mEq/L   Chloride 103 96 - 112 mEq/L   CO2 27 19 - 32 mEq/L   Glucose, Bld 95 70 - 99 mg/dL   BUN 11 6 - 23 mg/dL   Creatinine, Ser 0.62 0.40 - 1.20 mg/dL   Calcium 10.1 8.4 - 10.5 mg/dL   GFR  123.07 >60.00 mL/min  TSH     Status: None   Collection Time: 05/20/16  8:10 AM  Result Value Ref Range   TSH 2.36 0.35 - 4.50 uIU/mL    Assessment/Plan: 1. Thrombocytosis ([place]) Will repeat labs today. If improving, most likely reflects reactive thrombocytosis. If not improving will need to continue with hematology referral. Thankfully, headaches are resolving. Supportive measures reviewed with patient.  - CBC - Comp Met (CMET)   Leeanne Rio, PA-C

## 2016-05-27 ENCOUNTER — Ambulatory Visit: Payer: Medicare Other | Admitting: Physician Assistant

## 2016-05-28 DIAGNOSIS — M25662 Stiffness of left knee, not elsewhere classified: Secondary | ICD-10-CM | POA: Diagnosis not present

## 2016-05-28 DIAGNOSIS — Z471 Aftercare following joint replacement surgery: Secondary | ICD-10-CM | POA: Diagnosis not present

## 2016-05-28 DIAGNOSIS — Z96652 Presence of left artificial knee joint: Secondary | ICD-10-CM | POA: Diagnosis not present

## 2016-05-28 DIAGNOSIS — R2689 Other abnormalities of gait and mobility: Secondary | ICD-10-CM | POA: Diagnosis not present

## 2016-05-29 ENCOUNTER — Other Ambulatory Visit: Payer: Medicare Other

## 2016-05-29 ENCOUNTER — Ambulatory Visit: Payer: Medicare Other | Admitting: Family

## 2016-05-29 ENCOUNTER — Ambulatory Visit: Payer: Medicare Other

## 2016-05-30 DIAGNOSIS — M25662 Stiffness of left knee, not elsewhere classified: Secondary | ICD-10-CM | POA: Diagnosis not present

## 2016-05-30 DIAGNOSIS — R2689 Other abnormalities of gait and mobility: Secondary | ICD-10-CM | POA: Diagnosis not present

## 2016-05-30 DIAGNOSIS — Z96652 Presence of left artificial knee joint: Secondary | ICD-10-CM | POA: Diagnosis not present

## 2016-05-30 DIAGNOSIS — Z471 Aftercare following joint replacement surgery: Secondary | ICD-10-CM | POA: Diagnosis not present

## 2016-05-31 DIAGNOSIS — M25662 Stiffness of left knee, not elsewhere classified: Secondary | ICD-10-CM | POA: Diagnosis not present

## 2016-05-31 DIAGNOSIS — Z96652 Presence of left artificial knee joint: Secondary | ICD-10-CM | POA: Diagnosis not present

## 2016-05-31 DIAGNOSIS — R2689 Other abnormalities of gait and mobility: Secondary | ICD-10-CM | POA: Diagnosis not present

## 2016-05-31 DIAGNOSIS — Z471 Aftercare following joint replacement surgery: Secondary | ICD-10-CM | POA: Diagnosis not present

## 2016-06-03 ENCOUNTER — Other Ambulatory Visit: Payer: Self-pay | Admitting: Family

## 2016-06-03 ENCOUNTER — Encounter: Payer: Self-pay | Admitting: Family

## 2016-06-03 ENCOUNTER — Other Ambulatory Visit (HOSPITAL_BASED_OUTPATIENT_CLINIC_OR_DEPARTMENT_OTHER): Payer: BLUE CROSS/BLUE SHIELD

## 2016-06-03 ENCOUNTER — Ambulatory Visit: Payer: BLUE CROSS/BLUE SHIELD

## 2016-06-03 ENCOUNTER — Ambulatory Visit (HOSPITAL_BASED_OUTPATIENT_CLINIC_OR_DEPARTMENT_OTHER): Payer: BLUE CROSS/BLUE SHIELD | Admitting: Family

## 2016-06-03 VITALS — BP 123/77 | HR 75 | Temp 98.2°F | Resp 16 | Ht 64.0 in | Wt 156.0 lb

## 2016-06-03 DIAGNOSIS — D75839 Thrombocytosis, unspecified: Secondary | ICD-10-CM

## 2016-06-03 DIAGNOSIS — D473 Essential (hemorrhagic) thrombocythemia: Secondary | ICD-10-CM

## 2016-06-03 DIAGNOSIS — Z96652 Presence of left artificial knee joint: Secondary | ICD-10-CM | POA: Diagnosis not present

## 2016-06-03 DIAGNOSIS — M25662 Stiffness of left knee, not elsewhere classified: Secondary | ICD-10-CM | POA: Diagnosis not present

## 2016-06-03 DIAGNOSIS — D75838 Other thrombocytosis: Secondary | ICD-10-CM

## 2016-06-03 DIAGNOSIS — R2689 Other abnormalities of gait and mobility: Secondary | ICD-10-CM | POA: Diagnosis not present

## 2016-06-03 DIAGNOSIS — R7989 Other specified abnormal findings of blood chemistry: Secondary | ICD-10-CM | POA: Diagnosis not present

## 2016-06-03 DIAGNOSIS — Z471 Aftercare following joint replacement surgery: Secondary | ICD-10-CM | POA: Diagnosis not present

## 2016-06-03 LAB — COMPREHENSIVE METABOLIC PANEL
ALT: 13 U/L (ref 0–55)
AST: 18 U/L (ref 5–34)
Albumin: 4.2 g/dL (ref 3.5–5.0)
Alkaline Phosphatase: 102 U/L (ref 40–150)
Anion Gap: 10 mEq/L (ref 3–11)
BUN: 7.9 mg/dL (ref 7.0–26.0)
CO2: 27 mEq/L (ref 22–29)
Calcium: 10.3 mg/dL (ref 8.4–10.4)
Chloride: 105 mEq/L (ref 98–109)
Creatinine: 0.7 mg/dL (ref 0.6–1.1)
EGFR: >90 mL/min/{1.73_m2} (ref >90)
Glucose: 95 mg/dl (ref 70–140)
Potassium: 4.5 mEq/L (ref 3.5–5.1)
Sodium: 141 mEq/L (ref 136–145)
Total Bilirubin: 0.34 mg/dL (ref 0.20–1.20)
Total Protein: 8.8 g/dL — ABNORMAL HIGH (ref 6.4–8.3)

## 2016-06-03 LAB — CBC WITH DIFFERENTIAL (CANCER CENTER ONLY)
BASO#: 0 10*3/uL (ref 0.0–0.2)
BASO%: 0.1 % (ref 0.0–2.0)
EOS%: 0.7 % (ref 0.0–7.0)
Eosinophils Absolute: 0.1 10*3/uL (ref 0.0–0.5)
HCT: 41.7 % (ref 34.8–46.6)
HGB: 13.5 g/dL (ref 11.6–15.9)
LYMPH#: 2 10*3/uL (ref 0.9–3.3)
LYMPH%: 28 % (ref 14.0–48.0)
MCH: 29.5 pg (ref 26.0–34.0)
MCHC: 32.4 g/dL (ref 32.0–36.0)
MCV: 91 fL (ref 81–101)
MONO#: 0.5 10*3/uL (ref 0.1–0.9)
MONO%: 7.4 % (ref 0.0–13.0)
NEUT#: 4.6 10*3/uL (ref 1.5–6.5)
NEUT%: 63.8 % (ref 39.6–80.0)
Platelets: 362 10*3/uL (ref 145–400)
RBC: 4.57 10*6/uL (ref 3.70–5.32)
RDW: 12.9 % (ref 11.1–15.7)
WBC: 7.2 10*3/uL (ref 3.9–10.0)

## 2016-06-03 LAB — CHCC SATELLITE - SMEAR

## 2016-06-03 NOTE — Progress Notes (Signed)
Hematology/Oncology Consultation   Name: Melanie Lee      MRN: [id number]    Location: Room/bed info not found  Date: 06/03/2016 Time:10:27 PM   REFERRING PHYSICIAN: Brunetta Jeans, PA-C  REASON FOR CONSULT: Thrombocytosis   DIAGNOSIS: Thrombocytosis  HISTORY OF PRESENT ILLNESS: Melanie Lee is a very pleasant 70 yo African American female with recently elevated platelet counts (547 on 8/7 and 506 on 5/11) earlier this month. Her platelet count is now back down to 361. No anemia or leukopenia.  She c/o fatigue and a headache off and on. She stopped her pravachol last week to see if this was the cause but her symptoms have not yet resolved.  She a left knee replacement in July which went well and is currently home and receiving PT 3 days a week. She uses a cane to ambulate and has had no falls or syncopal episodes.  Her spleen is still in and she has had no issues with her liver.  No sickle cell trait or disease. No family history of thrombocytosis.  No fever, chills, n/v, cough, rash, dizziness, vision changes, chewing ice, oral sores, SOB, chest pain, palpitations, abdominal pain or changes in bowel or bladder habits.  No swelling, tenderness, numbness or tingling in her extremities. No new aches or "bone" pain.  Her appetite comes and goes. She is staying well hydrated. Her weight is stable.  She is not a smoker and does not drink ETOH.   ROS: All other 10 point review of systems is negative.   PAST MEDICAL HISTORY:   Past Medical History:  Diagnosis Date  . Elevated blood pressure (not hypertension)    "elevates sometimes with MD visits"  . Frequent headaches    04-19-16 "no longer bothered"- since no cholesterol med   . 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 Tramadol Nausea Only and Other (See Comments)    Dizziness      MEDICATIONS:  Current Outpatient Prescriptions on File Prior to  Visit  Medication Sig Dispense Refill  . aspirin EC 325 MG tablet Take 1 tablet (325 mg total) by mouth 2 (two) times daily. to prevent blood clots 30 tablet 0  . Calcium Carbonate-Vitamin D (CALCIUM-VITAMIN D) 500-200 MG-UNIT tablet Take 1 tablet by mouth daily.    . fluticasone (FLONASE) 50 MCG/ACT nasal spray Place 2 sprays into both nostrils daily.    Marland Kitchen HYDROcodone-acetaminophen (NORCO/VICODIN) 5-325 MG tablet Take 1-2 tablets by mouth every 4 (four) hours as needed (breakthrough pain). 80 tablet 0  . meclizine (ANTIVERT) 25 MG tablet Take 25 mg by mouth 3 (three) times daily as needed for dizziness.    . methocarbamol (ROBAXIN) 500 MG tablet Take 1 tablet (500 mg total) by mouth every 6 (six) hours as needed for muscle spasms. 40 tablet 1  . Multiple Vitamins-Minerals (MULTIVITAMIN ADULTS 50+) TABS Take 1 tablet by mouth daily.     . pravastatin (PRAVACHOL) 20 MG tablet Take 1 tablet (20 mg total) by mouth daily. 30 tablet 0   No current facility-administered medications on file prior to visit.      PAST SURGICAL HISTORY Past Surgical History:  Procedure Laterality Date  . ABDOMINAL HYSTERECTOMY  1995  . BREAST BIOPSY  1995  . CATARACT EXTRACTION  2013 & 2015   Bilateral  . KNEE SURGERY     Left Arthroscopy  . 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 Breast cancer Mother 54    Deceased  . Heart disease Mother   . Hypertension Mother   . Hypertension Father     Deceased  . Stroke Father   . Hypertension Sister   . Hypertension Brother   . Heart defect Daughter     SOCIAL HISTORY:  reports that she has never smoked. She has never used smokeless tobacco. She reports that she drinks alcohol. She reports that she does not use drugs.  PERFORMANCE STATUS: The patient's performance  status is 1 - Symptomatic but completely ambulatory  PHYSICAL EXAM: Most Recent Vital Signs: Blood pressure 123/77, pulse 75, temperature 98.2 F (36.8 C), temperature source Oral, resp. rate 16, height _0  (1.626 m), weight 156 lb (70.8 kg). BP 123/77 (BP Location: Left Arm, Patient Position: Sitting)   Pulse 75   Temp 98.2 F (36.8 C) (Oral)   Resp 16   Ht _1  (1.626 m)   Wt 156 lb (70.8 kg)   BMI 26.78 kg/m   General Appearance:    Alert, cooperative, no distress, appears stated age  Head:    Normocephalic, without obvious abnormality, atraumatic  Eyes:    PERRL, conjunctiva/corneas clear, EOM's intact, fundi    benign, both eyes        Throat:   Lips, mucosa, and tongue normal; teeth and gums normal  Neck:   Supple, symmetrical, trachea midline, no adenopathy;    thyroid:  no enlargement/tenderness/nodules; no carotid   bruit or JVD  Back:     Symmetric, no curvature, ROM normal, no CVA tenderness  Lungs:     Clear to auscultation bilaterally, respirations unlabored  Chest Wall:    No tenderness or deformity   Heart:    Regular rate and rhythm, S1 and S2 normal, no murmur, rub   or gallop     Abdomen:     Soft, non-tender, bowel sounds active all four quadrants,    no masses, no organomegaly        Extremities:   Extremities normal, atraumatic, no cyanosis or edema  Pulses:   2+ and symmetric all extremities  Skin:   Skin color, texture, turgor normal, no rashes or lesions  Lymph nodes:   Cervical, supraclavicular, and axillary nodes normal  Neurologic:   CNII-XII intact, normal strength, sensation and reflexes    throughout     LABORATORY DATA: Results for orders placed or performed in visit on 06/03/16 (from the past 48 hour(s))  CBC w/Diff     Status: None   Collection Time: 06/03/16  1:31 PM  Result Value Ref Range   WBC 7.2 3.9 - 10.0 10e3/uL   RBC 4.57 3.70 - 5.32 10e6/uL   HGB 13.5 11.6 - 15.9 g/dL   HCT 41.7 34.8 - 46.6 %   MCV 91 81 - 101 fL   MCH  29.5 26.0 - 34.0 pg   MCHC 32.4 32.0 - 36.0 g/dL   RDW 12.9 11.1 - 15.7 %   Platelets 362 145 - 400 10e3/uL   NEUT# 4.6 1.5 - 6.5 10e3/uL   LYMPH# 2.0 0.9 - 3.3 10e3/uL   MONO# 0.5 0.1 - 0.9 10e3/uL   Eosinophils Absolute 0.1 0.0 - 0.5 10e3/uL   BASO# 0.0 0.0 - 0.2 10e3/uL   NEUT% 63.8 39.6 -  80.0 %   LYMPH% 28.0 14.0 - 48.0 %   MONO% 7.4 0.0 - 13.0 %   EOS% 0.7 0.0 - 7.0 %   BASO% 0.1 0.0 - 2.0 %  CMP     Status: Abnormal   Collection Time: 06/03/16  1:31 PM  Result Value Ref Range   Sodium 141 136 - 145 mEq/L   Potassium 4.5 3.5 - 5.1 mEq/L   Chloride 105 98 - 109 mEq/L   CO2 27 22 - 29 mEq/L   Glucose 95 70 - 140 mg/dl    Comment: Glucose reference range is for nonfasting patients. Fasting glucose reference range is 70- 100.   BUN 7.9 7.0 - 26.0 mg/dL   Creatinine 0.7 0.6 - 1.1 mg/dL   Total Bilirubin 0.34 0.20 - 1.20 mg/dL   Alkaline Phosphatase 102 40 - 150 U/L   AST 18 5 - 34 U/L   ALT 13 0 - 55 U/L   Total Protein 8.8 (H) 6.4 - 8.3 g/dL   Albumin 4.2 3.5 - 5.0 g/dL   Calcium 10.3 8.4 - 10.4 mg/dL   Anion Gap 10 3 - 11 mEq/L   EGFR >90 >90 ml/min/1.73 m2    Comment: eGFR is calculated using the CKD-EPI Creatinine Equation (2009)  Smear     Status: None   Collection Time: 06/03/16  1:31 PM  Result Value Ref Range   Smear Result Smear Available       RADIOGRAPHY: No results found.     PATHOLOGY: None  ASSESSMENT/PLAN: Ms. Moffit is a very pleasant 70 yo [place] American female with recent episode of thrombocytosis which has since resolved and her counts are now back to normal at 362. No anemia or leukopenia.  She is symptomatic with fatigue and headache off and on possibly due to a cholesterol medication which her PCP has since had her stop.  She recently had a knee replacement and it is quite possible that some of her iron reserve was depleted during the procedure. This could have also caused the elevation in her platelet count. We will have her start taking an  over the counter PO iron supplement daily.   All questions were answered. She will not need to follow-up with our office at this time. She knows to contact us with any future hematologic problems, questions or concerns. We would be happy to see her again if needed.   She was discussed with and also seen by Dr. Marin Olp and he is in agreement with the aforementioned.   [facility]    Addendum:  I saw and examined the patient with Megyn Leng. We will get her blood smear under the microscope. She had minimal anisocytosis important she'll cytosis. There were no sickle cells. There were no nucleated red blood cells. There were no target cells. I saw no teardrop cells. White blood cells. Normal in morphology maturation. There were no immature myeloid or lymphoid forms. There were no hypersegmented polys. Platelets were adequate in number and size.  It is hard to say why she had a transient thrombocytosis. The platelet count really was not all that high. She was not anemic. I will note this might be reactionary. I she had a knee replacement. Maybe this was a factor.  Her blood smear was unremarkable which is a blessing. Her physical exam did not show any hepatosplenomegaly. As such I don't think she has any type of hematologic malignancy. I will see nothing that looks like a myeloproliferative disorder.  I'm not  sure that we really have to see her back. Again I cannot find any obvious hematologic issue that we have to help with. This is a blessing.  She is very nice. It was nice talking to her. We spent about 45 minutes with her. We went over her lab work. We answered her questions.  Lattie Haw, MD

## 2016-06-05 ENCOUNTER — Other Ambulatory Visit: Payer: Medicare Other

## 2016-06-05 ENCOUNTER — Ambulatory Visit: Payer: Medicare Other | Admitting: Family

## 2016-06-05 ENCOUNTER — Ambulatory Visit: Payer: Medicare Other

## 2016-06-05 DIAGNOSIS — M25662 Stiffness of left knee, not elsewhere classified: Secondary | ICD-10-CM | POA: Diagnosis not present

## 2016-06-05 DIAGNOSIS — Z96652 Presence of left artificial knee joint: Secondary | ICD-10-CM | POA: Diagnosis not present

## 2016-06-05 DIAGNOSIS — Z471 Aftercare following joint replacement surgery: Secondary | ICD-10-CM | POA: Diagnosis not present

## 2016-06-05 DIAGNOSIS — R2689 Other abnormalities of gait and mobility: Secondary | ICD-10-CM | POA: Diagnosis not present

## 2016-06-07 DIAGNOSIS — M25662 Stiffness of left knee, not elsewhere classified: Secondary | ICD-10-CM | POA: Diagnosis not present

## 2016-06-07 DIAGNOSIS — Z96652 Presence of left artificial knee joint: Secondary | ICD-10-CM | POA: Diagnosis not present

## 2016-06-07 DIAGNOSIS — Z471 Aftercare following joint replacement surgery: Secondary | ICD-10-CM | POA: Diagnosis not present

## 2016-06-07 DIAGNOSIS — R2689 Other abnormalities of gait and mobility: Secondary | ICD-10-CM | POA: Diagnosis not present

## 2016-06-11 DIAGNOSIS — Z471 Aftercare following joint replacement surgery: Secondary | ICD-10-CM | POA: Diagnosis not present

## 2016-06-11 DIAGNOSIS — M25662 Stiffness of left knee, not elsewhere classified: Secondary | ICD-10-CM | POA: Diagnosis not present

## 2016-06-11 DIAGNOSIS — R2689 Other abnormalities of gait and mobility: Secondary | ICD-10-CM | POA: Diagnosis not present

## 2016-06-11 DIAGNOSIS — Z96652 Presence of left artificial knee joint: Secondary | ICD-10-CM | POA: Diagnosis not present

## 2016-06-12 DIAGNOSIS — M25662 Stiffness of left knee, not elsewhere classified: Secondary | ICD-10-CM | POA: Diagnosis not present

## 2016-06-12 DIAGNOSIS — Z471 Aftercare following joint replacement surgery: Secondary | ICD-10-CM | POA: Diagnosis not present

## 2016-06-12 DIAGNOSIS — Z96652 Presence of left artificial knee joint: Secondary | ICD-10-CM | POA: Diagnosis not present

## 2016-06-12 DIAGNOSIS — R2689 Other abnormalities of gait and mobility: Secondary | ICD-10-CM | POA: Diagnosis not present

## 2016-06-14 DIAGNOSIS — Z471 Aftercare following joint replacement surgery: Secondary | ICD-10-CM | POA: Diagnosis not present

## 2016-06-14 DIAGNOSIS — M25662 Stiffness of left knee, not elsewhere classified: Secondary | ICD-10-CM | POA: Diagnosis not present

## 2016-06-14 DIAGNOSIS — Z96652 Presence of left artificial knee joint: Secondary | ICD-10-CM | POA: Diagnosis not present

## 2016-06-14 DIAGNOSIS — R2689 Other abnormalities of gait and mobility: Secondary | ICD-10-CM | POA: Diagnosis not present

## 2016-06-14 DIAGNOSIS — R293 Abnormal posture: Secondary | ICD-10-CM | POA: Diagnosis not present

## 2016-06-18 DIAGNOSIS — Z96652 Presence of left artificial knee joint: Secondary | ICD-10-CM | POA: Diagnosis not present

## 2016-06-18 DIAGNOSIS — R293 Abnormal posture: Secondary | ICD-10-CM | POA: Diagnosis not present

## 2016-06-18 DIAGNOSIS — R2689 Other abnormalities of gait and mobility: Secondary | ICD-10-CM | POA: Diagnosis not present

## 2016-06-18 DIAGNOSIS — M25662 Stiffness of left knee, not elsewhere classified: Secondary | ICD-10-CM | POA: Diagnosis not present

## 2016-06-18 DIAGNOSIS — Z471 Aftercare following joint replacement surgery: Secondary | ICD-10-CM | POA: Diagnosis not present

## 2016-06-20 DIAGNOSIS — Z471 Aftercare following joint replacement surgery: Secondary | ICD-10-CM | POA: Diagnosis not present

## 2016-06-20 DIAGNOSIS — R2689 Other abnormalities of gait and mobility: Secondary | ICD-10-CM | POA: Diagnosis not present

## 2016-06-20 DIAGNOSIS — R293 Abnormal posture: Secondary | ICD-10-CM | POA: Diagnosis not present

## 2016-06-20 DIAGNOSIS — Z96652 Presence of left artificial knee joint: Secondary | ICD-10-CM | POA: Diagnosis not present

## 2016-06-20 DIAGNOSIS — M25662 Stiffness of left knee, not elsewhere classified: Secondary | ICD-10-CM | POA: Diagnosis not present

## 2016-06-21 DIAGNOSIS — M25662 Stiffness of left knee, not elsewhere classified: Secondary | ICD-10-CM | POA: Diagnosis not present

## 2016-06-21 DIAGNOSIS — R293 Abnormal posture: Secondary | ICD-10-CM | POA: Diagnosis not present

## 2016-06-21 DIAGNOSIS — Z96652 Presence of left artificial knee joint: Secondary | ICD-10-CM | POA: Diagnosis not present

## 2016-06-21 DIAGNOSIS — Z471 Aftercare following joint replacement surgery: Secondary | ICD-10-CM | POA: Diagnosis not present

## 2016-06-21 DIAGNOSIS — R2689 Other abnormalities of gait and mobility: Secondary | ICD-10-CM | POA: Diagnosis not present

## 2016-06-24 ENCOUNTER — Encounter: Payer: Self-pay | Admitting: Physician Assistant

## 2016-06-24 ENCOUNTER — Ambulatory Visit (INDEPENDENT_AMBULATORY_CARE_PROVIDER_SITE_OTHER): Payer: BLUE CROSS/BLUE SHIELD | Admitting: Physician Assistant

## 2016-06-24 VITALS — BP 110/76 | HR 81 | Temp 98.5°F | Resp 16 | Ht 64.0 in | Wt 154.2 lb

## 2016-06-24 DIAGNOSIS — R319 Hematuria, unspecified: Secondary | ICD-10-CM

## 2016-06-24 DIAGNOSIS — R42 Dizziness and giddiness: Secondary | ICD-10-CM

## 2016-06-24 DIAGNOSIS — Z23 Encounter for immunization: Secondary | ICD-10-CM

## 2016-06-24 LAB — POCT URINALYSIS DIPSTICK
Bilirubin, UA: NEGATIVE
Blood, UA: NEGATIVE
Glucose, UA: NEGATIVE
Ketones, UA: NEGATIVE
Leukocytes, UA: NEGATIVE
Nitrite, UA: NEGATIVE
Protein, UA: NEGATIVE
Spec Grav, UA: 1.025
Urobilinogen, UA: 0.2
pH, UA: 6

## 2016-06-24 MED ORDER — FLUTICASONE PROPIONATE 50 MCG/ACT NA SUSP: 2.0000 | Freq: Every day | NASAL | 0 refills | Status: DC

## 2016-06-24 NOTE — Progress Notes (Signed)
Patient presents to clinic today c/o intermittent headaches thought initially to be 2/2 narcotic pain medication use and reactive thrombocytosis. Followed by Oncology currently.  Endorses episode Saturday night of headache associated with nausea and dizziness. Endorses dizziness improved with laying down and was exacerbated with standing. Endorses resolution of dizziness and headache. Denies vision changes, nausea, aura with headaches. Endorses taking Hydrocodone prior to onset of symptoms.    Past Medical History:  Diagnosis Date  . Elevated blood pressure (not hypertension)    "elevates sometimes with MD visits"  . Frequent headaches    04-19-16 "no longer bothered"- since no cholesterol med   . 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 aspirin EC 325 MG tablet Take 1 tablet (325 mg total) by mouth 2 (two) times daily. to prevent blood clots (Patient taking differently: Take 325 mg by mouth daily. to prevent blood clots) 30 tablet 0  . Calcium Carbonate-Vitamin D (CALCIUM-VITAMIN D) 500-200 MG-UNIT tablet Take 1 tablet by mouth daily.    . fluticasone (FLONASE) 50 MCG/ACT nasal spray Place 2 sprays into both nostrils daily.    Marland Kitchen HYDROcodone-acetaminophen (NORCO/VICODIN) 5-325 MG tablet Take 1-2 tablets by mouth every 4 (four) hours as needed (breakthrough pain). 80 tablet 0  . meclizine (ANTIVERT) 25 MG tablet Take 25 mg by mouth 3 (three) times daily as needed for dizziness.    . methocarbamol (ROBAXIN) 500 MG tablet Take 1 tablet (500 mg total) by mouth every 6 (six) hours as needed for muscle spasms. 40 tablet 1  . Multiple Vitamins-Minerals (MULTIVITAMIN ADULTS 50+) TABS Take 1 tablet by mouth daily.     . pravastatin (PRAVACHOL) 20 MG tablet Take 1 tablet (20 mg total) by mouth daily. (Patient not taking: Reported on 06/24/2016) 30 tablet 0   No current  facility-administered medications on file prior to visit.     Allergies  Allergen Reactions  . Tramadol Nausea Only and Other (See Comments)    Dizziness    Family History  Problem Relation Age of Onset  . Breast cancer Mother 45    Deceased  . Heart disease Mother   . Hypertension Mother   . Hypertension Father     Deceased  . Stroke Father   . Hypertension Sister   . Hypertension Brother   . Heart defect Daughter     Social History   Social History  . Marital status: Single    Spouse name: N/A  . Number of children: N/A  . Years of education: N/A   Social History Main Topics  . Smoking status: Never Smoker  . Smokeless tobacco: Never Used  . Alcohol use 0.0 oz/week     Comment: rare  . Drug use: No  . Sexual activity: Not Currently   Other Topics Concern  . 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   Review of Systems - See HPI.  All other ROS are negative.  BP 110/76 (BP Location: Left Arm, Patient Position: Sitting, Cuff Size: Normal)   Pulse 81   Temp 98.5 F (36.9 C) (Oral)   Resp 16   Ht _0  (1.626 m)   Wt 154 lb 4 oz (70 kg)   SpO2 99%   BMI 26.48 kg/m   Physical Exam  Constitutional: She is oriented to person, place, and time and well-developed,  well-nourished, and in no distress.  HENT:  Head: Normocephalic and atraumatic.  Eyes: Conjunctivae are normal.  Neck: Neck supple.  Cardiovascular: Normal rate, regular rhythm, normal heart sounds and intact distal pulses.   Pulmonary/Chest: Effort normal and breath sounds normal. No respiratory distress. She has no wheezes. She has no rales. She exhibits no tenderness.  Neurological: She is alert and oriented to person, place, and time.  Skin: Skin is warm and dry. No rash noted.  Psychiatric: Affect normal.  Vitals reviewed.   Recent Results (from the past 2160 hour(s))  Lipid panel     Status: None   Collection Time: 04/01/16  8:55 AM  Result Value Ref Range    Cholesterol 166 0 - 200 mg/dL    Comment: ATP III Classification       Desirable:  < 200 mg/dL               Borderline High:  200 - 239 mg/dL          High:  > = 240 mg/dL   Triglycerides 98.0 0.0 - 149.0 mg/dL    Comment: Normal:  <150 mg/dLBorderline High:  150 - 199 mg/dL   HDL 55.50 >39.00 mg/dL   VLDL 19.6 0.0 - 40.0 mg/dL   LDL Cholesterol 91 0 - 99 mg/dL   Total CHOL/HDL Ratio 3     Comment:                Men          Women1/2 Average Risk     3.4          3.3Average Risk          5.0          4.42X Average Risk          9.6          7.13X Average Risk          15.0          11.0                       NonHDL 110.50     Comment: NOTE:  Non-HDL goal should be 30 mg/dL higher than patient's LDL goal (i.e. LDL goal of < 70 mg/dL, would have non-HDL goal of < 100 mg/dL)  Comp Met (CMET)     Status: None   Collection Time: 04/01/16  8:55 AM  Result Value Ref Range   Sodium 139 135 - 145 mEq/L   Potassium 3.9 3.5 - 5.1 mEq/L   Chloride 105 96 - 112 mEq/L   CO2 23 19 - 32 mEq/L   Glucose, Bld 98 70 - 99 mg/dL   BUN 11 6 - 23 mg/dL   Creatinine, Ser 0.61 0.40 - 1.20 mg/dL   Total Bilirubin 0.5 0.2 - 1.2 mg/dL   Alkaline Phosphatase 87 39 - 117 U/L   AST 20 0 - 37 U/L   ALT 13 0 - 35 U/L   Total Protein 8.3 6.0 - 8.3 g/dL   Albumin 4.5 3.5 - 5.2 g/dL   Calcium 9.8 8.4 - 10.5 mg/dL   GFR 125.45 >60.00 mL/min  INR/PT     Status: Abnormal   Collection Time: 04/01/16  8:55 AM  Result Value Ref Range   INR 1.1 (H) 0.8 - 1.0 ratio   Prothrombin Time 11.8 9.6 - 13.1 sec  Surgical pcr screen     Status: None   Collection  Time: 04/19/16 11:00 AM  Result Value Ref Range   MRSA, PCR NEGATIVE NEGATIVE   Staphylococcus aureus NEGATIVE NEGATIVE    Comment:        The Xpert SA Assay (FDA approved for NASAL specimens in patients over 56 years of age), is one component of a comprehensive surveillance program.  Test performance has been validated by [facility] for patients greater than  or equal to 25 year old. It is not intended to diagnose infection nor to guide or monitor treatment.   Urinalysis, Routine w reflex microscopic (not at [facility])     Status: Abnormal   Collection Time: 04/19/16 11:00 AM  Result Value Ref Range   Color, Urine YELLOW YELLOW   APPearance CLEAR CLEAR   Specific Gravity, Urine 1.023 1.005 - 1.030   pH 5.0 5.0 - 8.0   Glucose, UA NEGATIVE NEGATIVE mg/dL   Hgb urine dipstick TRACE (A) NEGATIVE   Bilirubin Urine NEGATIVE NEGATIVE   Ketones, ur NEGATIVE NEGATIVE mg/dL   Protein, ur NEGATIVE NEGATIVE mg/dL   Nitrite NEGATIVE NEGATIVE   Leukocytes, UA NEGATIVE NEGATIVE  Urine microscopic-add on     Status: Abnormal   Collection Time: 04/19/16 11:00 AM  Result Value Ref Range   Squamous Epithelial / LPF 0-5 (A) NONE SEEN   WBC, UA 0-5 0 - 5 WBC/hpf   RBC / HPF 0-5 0 - 5 RBC/hpf   Bacteria, UA FEW (A) NONE SEEN  Type and screen Order type and screen if day of surgery is less than 15 days from draw of preadmission visit or order morning of surgery if day of surgery is greater than 6 days from preadmission visit.     Status: None   Collection Time: 04/19/16 12:10 PM  Result Value Ref Range   ABO/RH(D) O POS    Antibody Screen NEG    Sample Expiration 04/26/2016    Extend sample reason NO TRANSFUSIONS OR PREGNANCY IN THE PAST 3 MONTHS   APTT     Status: None   Collection Time: 04/19/16 12:30 PM  Result Value Ref Range   aPTT 31 24 - 37 seconds  CBC WITH DIFFERENTIAL     Status: None   Collection Time: 04/19/16 12:30 PM  Result Value Ref Range   WBC 8.9 4.0 - 10.5 K/uL   RBC 4.74 3.87 - 5.11 MIL/uL   Hemoglobin 13.6 12.0 - 15.0 g/dL   HCT 41.2 36.0 - 46.0 %   MCV 86.9 78.0 - 100.0 fL   MCH 28.7 26.0 - 34.0 pg   MCHC 33.0 30.0 - 36.0 g/dL   RDW 12.4 11.5 - 15.5 %   Platelets 322 150 - 400 K/uL   Neutrophils Relative % 71 %   Neutro Abs 6.3 1.7 - 7.7 K/uL   Lymphocytes Relative 24 %   Lymphs Abs 2.1 0.7 - 4.0 K/uL   Monocytes Relative 5  %   Monocytes Absolute 0.5 0.1 - 1.0 K/uL   Eosinophils Relative 0 %   Eosinophils Absolute 0.0 0.0 - 0.7 K/uL   Basophils Relative 0 %   Basophils Absolute 0.0 0.0 - 0.1 K/uL  Protime-INR     Status: None   Collection Time: 04/19/16 12:30 PM  Result Value Ref Range   Prothrombin Time 13.7 11.6 - 15.2 seconds   INR 1.03 0.00 - 1.49  ABO/Rh     Status: None   Collection Time: 04/19/16 12:30 PM  Result Value Ref Range   ABO/RH(D) O POS  CBC     Status: Abnormal   Collection Time: 04/24/16  4:14 AM  Result Value Ref Range   WBC 19.3 (H) 4.0 - 10.5 K/uL   RBC 4.52 3.87 - 5.11 MIL/uL   Hemoglobin 13.1 12.0 - 15.0 g/dL   HCT 39.8 36.0 - 46.0 %   MCV 88.1 78.0 - 100.0 fL   MCH 29.0 26.0 - 34.0 pg   MCHC 32.9 30.0 - 36.0 g/dL   RDW 12.2 11.5 - 15.5 %   Platelets 328 150 - 400 K/uL  Basic metabolic panel     Status: Abnormal   Collection Time: 04/24/16  4:14 AM  Result Value Ref Range   Sodium 135 135 - 145 mmol/L   Potassium 3.9 3.5 - 5.1 mmol/L   Chloride 100 (L) 101 - 111 mmol/L   CO2 26 22 - 32 mmol/L   Glucose, Bld 135 (H) 65 - 99 mg/dL   BUN 9 6 - 20 mg/dL   Creatinine, Ser 0.51 0.44 - 1.00 mg/dL   Calcium 9.2 8.9 - 10.3 mg/dL   GFR calc non Af Amer >60 >60 mL/min   GFR calc Af Amer >60 >60 mL/min    Comment: (NOTE) The eGFR has been calculated using the CKD EPI equation. This calculation has not been validated in all clinical situations. eGFR's persistently <60 mL/min signify possible Chronic Kidney Disease.    Anion gap 9 5 - 15  CBC     Status: Abnormal   Collection Time: 04/25/16  4:12 AM  Result Value Ref Range   WBC 16.4 (H) 4.0 - 10.5 K/uL   RBC 3.99 3.87 - 5.11 MIL/uL   Hemoglobin 11.6 (L) 12.0 - 15.0 g/dL   HCT 35.7 (L) 36.0 - 46.0 %   MCV 89.5 78.0 - 100.0 fL   MCH 29.1 26.0 - 34.0 pg   MCHC 32.5 30.0 - 36.0 g/dL   RDW 12.5 11.5 - 15.5 %   Platelets 308 150 - 400 K/uL  Basic metabolic panel     Status: Abnormal   Collection Time: 04/25/16  4:12 AM    Result Value Ref Range   Sodium 135 135 - 145 mmol/L   Potassium 4.0 3.5 - 5.1 mmol/L   Chloride 100 (L) 101 - 111 mmol/L   CO2 29 22 - 32 mmol/L   Glucose, Bld 118 (H) 65 - 99 mg/dL   BUN 11 6 - 20 mg/dL   Creatinine, Ser 0.64 0.44 - 1.00 mg/dL   Calcium 8.6 (L) 8.9 - 10.3 mg/dL   GFR calc non Af Amer >60 >60 mL/min   GFR calc Af Amer >60 >60 mL/min    Comment: (NOTE) The eGFR has been calculated using the CKD EPI equation. This calculation has not been validated in all clinical situations. eGFR's persistently <60 mL/min signify possible Chronic Kidney Disease.    Anion gap 6 5 - 15  CBC     Status: Abnormal   Collection Time: 05/20/16  8:10 AM  Result Value Ref Range   WBC 8.1 4.0 - 10.5 K/uL   RBC 4.17 3.87 - 5.11 Mil/uL   Platelets 547.0 (H) 150.0 - 400.0 K/uL   Hemoglobin 12.1 12.0 - 15.0 g/dL   HCT 37.3 36.0 - 46.0 %   MCV 89.6 78.0 - 100.0 fl   MCHC 32.3 30.0 - 36.0 g/dL   RDW 13.9 11.5 - 67.6 %  Basic metabolic panel     Status: None   Collection Time: 05/20/16  8:10 AM  Result Value Ref Range   Sodium 140 135 - 145 mEq/L   Potassium 4.4 3.5 - 5.1 mEq/L   Chloride 103 96 - 112 mEq/L   CO2 27 19 - 32 mEq/L   Glucose, Bld 95 70 - 99 mg/dL   BUN 11 6 - 23 mg/dL   Creatinine, Ser 0.62 0.40 - 1.20 mg/dL   Calcium 10.1 8.4 - 10.5 mg/dL   GFR 123.07 >60.00 mL/min  TSH     Status: None   Collection Time: 05/20/16  8:10 AM  Result Value Ref Range   TSH 2.36 0.35 - 4.50 uIU/mL  CBC     Status: Abnormal   Collection Time: 05/24/16 11:24 AM  Result Value Ref Range   WBC 8.7 4.0 - 10.5 K/uL   RBC 4.30 3.87 - 5.11 Mil/uL   Platelets 506.0 (H) 150.0 - 400.0 K/uL   Hemoglobin 12.5 12.0 - 15.0 g/dL   HCT 37.8 36.0 - 46.0 %   MCV 88.0 78.0 - 100.0 fl   MCHC 33.0 30.0 - 36.0 g/dL   RDW 13.9 11.5 - 15.5 %  Comp Met (CMET)     Status: Abnormal   Collection Time: 05/24/16 11:24 AM  Result Value Ref Range   Sodium 139 135 - 145 mEq/L   Potassium 3.7 3.5 - 5.1 mEq/L    Chloride 104 96 - 112 mEq/L   CO2 27 19 - 32 mEq/L   Glucose, Bld 103 (H) 70 - 99 mg/dL   BUN 9 6 - 23 mg/dL   Creatinine, Ser 0.53 0.40 - 1.20 mg/dL   Total Bilirubin 0.4 0.2 - 1.2 mg/dL   Alkaline Phosphatase 87 39 - 117 U/L   AST 18 0 - 37 U/L   ALT 10 0 - 35 U/L   Total Protein 8.4 (H) 6.0 - 8.3 g/dL   Albumin 4.3 3.5 - 5.2 g/dL   Calcium 10.3 8.4 - 10.5 mg/dL   GFR 147.49 >60.00 mL/min  CBC w/Diff     Status: None   Collection Time: 06/03/16  1:31 PM  Result Value Ref Range   WBC 7.2 3.9 - 10.0 10e3/uL   RBC 4.57 3.70 - 5.32 10e6/uL   HGB 13.5 11.6 - 15.9 g/dL   HCT 41.7 34.8 - 46.6 %   MCV 91 81 - 101 fL   MCH 29.5 26.0 - 34.0 pg   MCHC 32.4 32.0 - 36.0 g/dL   RDW 12.9 11.1 - 15.7 %   Platelets 362 145 - 400 10e3/uL   NEUT# 4.6 1.5 - 6.5 10e3/uL   LYMPH# 2.0 0.9 - 3.3 10e3/uL   MONO# 0.5 0.1 - 0.9 10e3/uL   Eosinophils Absolute 0.1 0.0 - 0.5 10e3/uL   BASO# 0.0 0.0 - 0.2 10e3/uL   NEUT% 63.8 39.6 - 80.0 %   LYMPH% 28.0 14.0 - 48.0 %   MONO% 7.4 0.0 - 13.0 %   EOS% 0.7 0.0 - 7.0 %   BASO% 0.1 0.0 - 2.0 %  CMP     Status: Abnormal   Collection Time: 06/03/16  1:31 PM  Result Value Ref Range   Sodium 141 136 - 145 mEq/L   Potassium 4.5 3.5 - 5.1 mEq/L   Chloride 105 98 - 109 mEq/L   CO2 27 22 - 29 mEq/L   Glucose 95 70 - 140 mg/dl    Comment: Glucose reference range is for nonfasting patients. Fasting glucose reference range is 70- 100.   BUN 7.9 7.0 -  26.0 mg/dL   Creatinine 0.7 0.6 - 1.1 mg/dL   Total Bilirubin 0.34 0.20 - 1.20 mg/dL   Alkaline Phosphatase 102 40 - 150 U/L   AST 18 5 - 34 U/L   ALT 13 0 - 55 U/L   Total Protein 8.8 (H) 6.4 - 8.3 g/dL   Albumin 4.2 3.5 - 5.0 g/dL   Calcium 10.3 8.4 - 10.4 mg/dL   Anion Gap 10 3 - 11 mEq/L   EGFR >90 >90 ml/min/1.73 m2    Comment: eGFR is calculated using the CKD-EPI Creatinine Equation (2009)  Smear     Status: None   Collection Time: 06/03/16  1:31 PM  Result Value Ref Range   Smear Result Smear  Available    Assessment/Plan: 1. Vertigo 2/2 ETD. Rx Flonase and Claritin. Supportive measures reviewed. Will have her start home EPley maneuvers in case of BPPV component. OVS negative. No residual headache or neurological symptoms. Feel headache related to sinus pressure and ear pressure. If vertigo not improving or if there is residual headache, will proceed with imaging.  - fluticasone (FLONASE) 50 MCG/ACT nasal spray; Place 2 sprays into both nostrils daily.  Dispense: 16 g; Refill: 0   Leeanne Rio, [place]

## 2016-06-24 NOTE — Patient Instructions (Signed)
Please take Flonase daily over the next week. I recommend an OTC Claritin. Limit Norco use. Follow-up with PT as scheduled.  Please stay hydrated and eat a well-balanced diet. Please start exercises below. If symptoms are not resolving we will proceed with imaging and Neurology referral.  Epley Maneuver Self-Care WHAT IS THE EPLEY MANEUVER? The Epley maneuver is an exercise you can do to relieve symptoms of benign paroxysmal positional vertigo (BPPV). This condition is often just referred to as vertigo. BPPV is caused by the movement of tiny crystals (canaliths) inside your inner ear. The accumulation and movement of canaliths in your inner ear causes a sudden spinning sensation (vertigo) when you move your head to certain positions. Vertigo usually lasts about 30 seconds. BPPV usually occurs in just one ear. If you get vertigo when you lie on your left side, you probably have BPPV in your left ear. Your health care provider can tell you which ear is involved.  BPPV may be caused by a head injury. Many people older than 50 get BPPV for unknown reasons. If you have been diagnosed with BPPV, your health care provider may teach you how to do this maneuver. BPPV is not life threatening (benign) and usually goes away in time.  WHEN SHOULD I PERFORM THE EPLEY MANEUVER? You can do this maneuver at home whenever you have symptoms of vertigo. You may do the Epley maneuver up to 3 times a day until your symptoms of vertigo go away. HOW SHOULD I DO THE EPLEY MANEUVER? 1. Sit on the edge of a bed or table with your back straight. Your legs should be extended or hanging over the edge of the bed or table.  2. Turn your head halfway toward the affected ear.  3. Lie backward quickly with your head turned until you are lying flat on your back. You may want to position a pillow under your shoulders.  4. Hold this position for 30 seconds. You may experience an attack of vertigo. This is normal. Hold this position  until the vertigo stops. 5. Then turn your head to the opposite direction until your unaffected ear is facing the floor.  6. Hold this position for 30 seconds. You may experience an attack of vertigo. This is normal. Hold this position until the vertigo stops. 7. Now turn your whole body to the same side as your head. Hold for another 30 seconds.  8. You can then sit back up. ARE THERE RISKS TO THIS MANEUVER? In some cases, you may have other symptoms (such as changes in your vision, weakness, or numbness). If you have these symptoms, stop doing the maneuver and call your health care provider. Even if doing these maneuvers relieves your vertigo, you may still have dizziness. Dizziness is the sensation of light-headedness but without the sensation of movement. Even though the Epley maneuver may relieve your vertigo, it is possible that your symptoms will return within 5 years. WHAT SHOULD I DO AFTER THIS MANEUVER? After doing the Epley maneuver, you can return to your normal activities. Ask your doctor if there is anything you should do at home to prevent vertigo. This may include:  Sleeping with two or more pillows to keep your head elevated.  Not sleeping on the side of your affected ear.  Getting up slowly from bed.  Avoiding sudden movements during the day.  Avoiding extreme head movement, like looking up or bending over.  Wearing a cervical collar to prevent sudden head movements. WHAT SHOULD I  DO IF MY SYMPTOMS GET WORSE? Call your health care provider if your vertigo gets worse. Call your provider right way if you have other symptoms, including:   Nausea.  Vomiting.  Headache.  Weakness.  Numbness.  Vision changes.   This information is not intended to replace advice given to you by your health care provider. Make sure you discuss any questions you have with your health care provider.   Document Released: 10/05/2013 Document Reviewed: 10/05/2013 Elsevier Interactive  Patient Education [employer].

## 2016-06-24 NOTE — Progress Notes (Signed)
Pre visit review using our clinic review tool, if applicable. No additional management support is needed unless otherwise documented below in the visit note/SLS  

## 2016-06-24 NOTE — Addendum Note (Signed)
Addended by: Rockwell Germany on: 06/24/2016 12:22 PM   Modules accepted: Orders

## 2016-06-25 DIAGNOSIS — Z96652 Presence of left artificial knee joint: Secondary | ICD-10-CM | POA: Diagnosis not present

## 2016-06-25 DIAGNOSIS — Z471 Aftercare following joint replacement surgery: Secondary | ICD-10-CM | POA: Diagnosis not present

## 2016-06-25 DIAGNOSIS — R2689 Other abnormalities of gait and mobility: Secondary | ICD-10-CM | POA: Diagnosis not present

## 2016-06-25 DIAGNOSIS — R293 Abnormal posture: Secondary | ICD-10-CM | POA: Diagnosis not present

## 2016-06-25 DIAGNOSIS — M25662 Stiffness of left knee, not elsewhere classified: Secondary | ICD-10-CM | POA: Diagnosis not present

## 2016-06-27 DIAGNOSIS — R2689 Other abnormalities of gait and mobility: Secondary | ICD-10-CM | POA: Diagnosis not present

## 2016-06-27 DIAGNOSIS — M25662 Stiffness of left knee, not elsewhere classified: Secondary | ICD-10-CM | POA: Diagnosis not present

## 2016-06-27 DIAGNOSIS — R293 Abnormal posture: Secondary | ICD-10-CM | POA: Diagnosis not present

## 2016-06-27 DIAGNOSIS — Z471 Aftercare following joint replacement surgery: Secondary | ICD-10-CM | POA: Diagnosis not present

## 2016-06-27 DIAGNOSIS — Z96652 Presence of left artificial knee joint: Secondary | ICD-10-CM | POA: Diagnosis not present

## 2016-07-01 DIAGNOSIS — M65872 Other synovitis and tenosynovitis, left ankle and foot: Secondary | ICD-10-CM | POA: Diagnosis not present

## 2016-07-01 DIAGNOSIS — M19072 Primary osteoarthritis, left ankle and foot: Secondary | ICD-10-CM | POA: Diagnosis not present

## 2016-07-01 DIAGNOSIS — M79672 Pain in left foot: Secondary | ICD-10-CM | POA: Diagnosis not present

## 2016-07-02 DIAGNOSIS — R2689 Other abnormalities of gait and mobility: Secondary | ICD-10-CM | POA: Diagnosis not present

## 2016-07-02 DIAGNOSIS — R293 Abnormal posture: Secondary | ICD-10-CM | POA: Diagnosis not present

## 2016-07-02 DIAGNOSIS — Z96652 Presence of left artificial knee joint: Secondary | ICD-10-CM | POA: Diagnosis not present

## 2016-07-02 DIAGNOSIS — Z471 Aftercare following joint replacement surgery: Secondary | ICD-10-CM | POA: Diagnosis not present

## 2016-07-02 DIAGNOSIS — M25662 Stiffness of left knee, not elsewhere classified: Secondary | ICD-10-CM | POA: Diagnosis not present

## 2016-07-04 DIAGNOSIS — M25662 Stiffness of left knee, not elsewhere classified: Secondary | ICD-10-CM | POA: Diagnosis not present

## 2016-07-04 DIAGNOSIS — Z471 Aftercare following joint replacement surgery: Secondary | ICD-10-CM | POA: Diagnosis not present

## 2016-07-04 DIAGNOSIS — Z96652 Presence of left artificial knee joint: Secondary | ICD-10-CM | POA: Diagnosis not present

## 2016-07-04 DIAGNOSIS — Z1231 Encounter for screening mammogram for malignant neoplasm of breast: Secondary | ICD-10-CM | POA: Diagnosis not present

## 2016-07-04 DIAGNOSIS — R2689 Other abnormalities of gait and mobility: Secondary | ICD-10-CM | POA: Diagnosis not present

## 2016-07-04 DIAGNOSIS — R293 Abnormal posture: Secondary | ICD-10-CM | POA: Diagnosis not present

## 2016-07-05 ENCOUNTER — Telehealth: Payer: Self-pay | Admitting: Physician Assistant

## 2016-07-05 NOTE — Telephone Encounter (Signed)
Received Mammogram results from [facility] looks good. No concerning findings. This is great news. The Radiology department that performed the mammogram will also be mailing her a letter with her results.

## 2016-07-05 NOTE — Telephone Encounter (Signed)
Patient informed, understood & agreed/SLS 09/22

## 2016-07-09 DIAGNOSIS — R2689 Other abnormalities of gait and mobility: Secondary | ICD-10-CM | POA: Diagnosis not present

## 2016-07-09 DIAGNOSIS — Z96652 Presence of left artificial knee joint: Secondary | ICD-10-CM | POA: Diagnosis not present

## 2016-07-09 DIAGNOSIS — Z471 Aftercare following joint replacement surgery: Secondary | ICD-10-CM | POA: Diagnosis not present

## 2016-07-09 DIAGNOSIS — M25662 Stiffness of left knee, not elsewhere classified: Secondary | ICD-10-CM | POA: Diagnosis not present

## 2016-07-09 DIAGNOSIS — R293 Abnormal posture: Secondary | ICD-10-CM | POA: Diagnosis not present

## 2016-07-11 DIAGNOSIS — M25662 Stiffness of left knee, not elsewhere classified: Secondary | ICD-10-CM | POA: Diagnosis not present

## 2016-07-11 DIAGNOSIS — R293 Abnormal posture: Secondary | ICD-10-CM | POA: Diagnosis not present

## 2016-07-11 DIAGNOSIS — Z471 Aftercare following joint replacement surgery: Secondary | ICD-10-CM | POA: Diagnosis not present

## 2016-07-11 DIAGNOSIS — R2689 Other abnormalities of gait and mobility: Secondary | ICD-10-CM | POA: Diagnosis not present

## 2016-07-11 DIAGNOSIS — Z96652 Presence of left artificial knee joint: Secondary | ICD-10-CM | POA: Diagnosis not present

## 2016-07-16 DIAGNOSIS — Z471 Aftercare following joint replacement surgery: Secondary | ICD-10-CM | POA: Diagnosis not present

## 2016-07-16 DIAGNOSIS — R2689 Other abnormalities of gait and mobility: Secondary | ICD-10-CM | POA: Diagnosis not present

## 2016-07-16 DIAGNOSIS — R293 Abnormal posture: Secondary | ICD-10-CM | POA: Diagnosis not present

## 2016-07-16 DIAGNOSIS — Z96652 Presence of left artificial knee joint: Secondary | ICD-10-CM | POA: Diagnosis not present

## 2016-07-16 DIAGNOSIS — M25662 Stiffness of left knee, not elsewhere classified: Secondary | ICD-10-CM | POA: Diagnosis not present

## 2016-07-18 DIAGNOSIS — Z471 Aftercare following joint replacement surgery: Secondary | ICD-10-CM | POA: Diagnosis not present

## 2016-07-18 DIAGNOSIS — M25662 Stiffness of left knee, not elsewhere classified: Secondary | ICD-10-CM | POA: Diagnosis not present

## 2016-07-18 DIAGNOSIS — R293 Abnormal posture: Secondary | ICD-10-CM | POA: Diagnosis not present

## 2016-07-18 DIAGNOSIS — R2689 Other abnormalities of gait and mobility: Secondary | ICD-10-CM | POA: Diagnosis not present

## 2016-07-18 DIAGNOSIS — Z96652 Presence of left artificial knee joint: Secondary | ICD-10-CM | POA: Diagnosis not present

## 2016-07-23 DIAGNOSIS — Z471 Aftercare following joint replacement surgery: Secondary | ICD-10-CM | POA: Diagnosis not present

## 2016-07-23 DIAGNOSIS — M25662 Stiffness of left knee, not elsewhere classified: Secondary | ICD-10-CM | POA: Diagnosis not present

## 2016-07-23 DIAGNOSIS — R293 Abnormal posture: Secondary | ICD-10-CM | POA: Diagnosis not present

## 2016-07-23 DIAGNOSIS — R2689 Other abnormalities of gait and mobility: Secondary | ICD-10-CM | POA: Diagnosis not present

## 2016-07-23 DIAGNOSIS — Z96652 Presence of left artificial knee joint: Secondary | ICD-10-CM | POA: Diagnosis not present

## 2016-07-25 DIAGNOSIS — Z471 Aftercare following joint replacement surgery: Secondary | ICD-10-CM | POA: Diagnosis not present

## 2016-07-25 DIAGNOSIS — M25662 Stiffness of left knee, not elsewhere classified: Secondary | ICD-10-CM | POA: Diagnosis not present

## 2016-07-25 DIAGNOSIS — R2689 Other abnormalities of gait and mobility: Secondary | ICD-10-CM | POA: Diagnosis not present

## 2016-07-25 DIAGNOSIS — Z96652 Presence of left artificial knee joint: Secondary | ICD-10-CM | POA: Diagnosis not present

## 2016-07-25 DIAGNOSIS — R293 Abnormal posture: Secondary | ICD-10-CM | POA: Diagnosis not present

## 2016-07-29 DIAGNOSIS — Z5181 Encounter for therapeutic drug level monitoring: Secondary | ICD-10-CM | POA: Diagnosis not present

## 2016-07-30 DIAGNOSIS — R293 Abnormal posture: Secondary | ICD-10-CM | POA: Diagnosis not present

## 2016-07-30 DIAGNOSIS — R2689 Other abnormalities of gait and mobility: Secondary | ICD-10-CM | POA: Diagnosis not present

## 2016-07-30 DIAGNOSIS — M25662 Stiffness of left knee, not elsewhere classified: Secondary | ICD-10-CM | POA: Diagnosis not present

## 2016-07-30 DIAGNOSIS — Z96652 Presence of left artificial knee joint: Secondary | ICD-10-CM | POA: Diagnosis not present

## 2016-07-30 DIAGNOSIS — Z471 Aftercare following joint replacement surgery: Secondary | ICD-10-CM | POA: Diagnosis not present

## 2016-08-01 DIAGNOSIS — Z471 Aftercare following joint replacement surgery: Secondary | ICD-10-CM | POA: Diagnosis not present

## 2016-08-01 DIAGNOSIS — M25662 Stiffness of left knee, not elsewhere classified: Secondary | ICD-10-CM | POA: Diagnosis not present

## 2016-08-01 DIAGNOSIS — Z96652 Presence of left artificial knee joint: Secondary | ICD-10-CM | POA: Diagnosis not present

## 2016-08-01 DIAGNOSIS — R293 Abnormal posture: Secondary | ICD-10-CM | POA: Diagnosis not present

## 2016-08-01 DIAGNOSIS — R2689 Other abnormalities of gait and mobility: Secondary | ICD-10-CM | POA: Diagnosis not present

## 2016-08-06 ENCOUNTER — Other Ambulatory Visit: Payer: Self-pay | Admitting: Physician Assistant

## 2016-08-07 NOTE — Telephone Encounter (Signed)
Rx request to pharmacy/SLS  

## 2016-08-08 DIAGNOSIS — M25662 Stiffness of left knee, not elsewhere classified: Secondary | ICD-10-CM | POA: Diagnosis not present

## 2016-08-08 DIAGNOSIS — R2689 Other abnormalities of gait and mobility: Secondary | ICD-10-CM | POA: Diagnosis not present

## 2016-08-08 DIAGNOSIS — Z471 Aftercare following joint replacement surgery: Secondary | ICD-10-CM | POA: Diagnosis not present

## 2016-08-08 DIAGNOSIS — Z96652 Presence of left artificial knee joint: Secondary | ICD-10-CM | POA: Diagnosis not present

## 2016-08-08 DIAGNOSIS — R293 Abnormal posture: Secondary | ICD-10-CM | POA: Diagnosis not present

## 2016-08-08 DIAGNOSIS — M1712 Unilateral primary osteoarthritis, left knee: Secondary | ICD-10-CM | POA: Diagnosis not present

## 2016-08-08 DIAGNOSIS — M1711 Unilateral primary osteoarthritis, right knee: Secondary | ICD-10-CM | POA: Diagnosis not present

## 2016-08-09 DIAGNOSIS — H5211 Myopia, right eye: Secondary | ICD-10-CM | POA: Diagnosis not present

## 2016-08-09 DIAGNOSIS — H5202 Hypermetropia, left eye: Secondary | ICD-10-CM | POA: Diagnosis not present

## 2016-08-09 DIAGNOSIS — H52222 Regular astigmatism, left eye: Secondary | ICD-10-CM | POA: Diagnosis not present

## 2016-08-09 DIAGNOSIS — H52221 Regular astigmatism, right eye: Secondary | ICD-10-CM | POA: Diagnosis not present

## 2016-08-29 DIAGNOSIS — Z5181 Encounter for therapeutic drug level monitoring: Secondary | ICD-10-CM | POA: Diagnosis not present

## 2016-08-30 ENCOUNTER — Other Ambulatory Visit (HOSPITAL_BASED_OUTPATIENT_CLINIC_OR_DEPARTMENT_OTHER): Payer: Self-pay | Admitting: Podiatry

## 2016-08-30 DIAGNOSIS — R52 Pain, unspecified: Secondary | ICD-10-CM

## 2016-09-07 ENCOUNTER — Ambulatory Visit (HOSPITAL_BASED_OUTPATIENT_CLINIC_OR_DEPARTMENT_OTHER)
Admission: RE | Admit: 2016-09-07 | Discharge: 2016-09-07 | Disposition: A | Discharge status: Home / Self Care | Payer: BLUE CROSS/BLUE SHIELD | Source: Ambulatory Visit | Attending: Podiatry | Admitting: Podiatry

## 2016-09-07 DIAGNOSIS — M12872 Other specific arthropathies, not elsewhere classified, left ankle and foot: Secondary | ICD-10-CM | POA: Diagnosis not present

## 2016-09-07 DIAGNOSIS — M79672 Pain in left foot: Secondary | ICD-10-CM | POA: Insufficient documentation

## 2016-09-07 DIAGNOSIS — R52 Pain, unspecified: Secondary | ICD-10-CM

## 2016-09-18 DIAGNOSIS — Z5181 Encounter for therapeutic drug level monitoring: Secondary | ICD-10-CM | POA: Diagnosis not present

## 2016-10-01 DIAGNOSIS — M79672 Pain in left foot: Secondary | ICD-10-CM | POA: Diagnosis not present

## 2016-10-01 DIAGNOSIS — M19072 Primary osteoarthritis, left ankle and foot: Secondary | ICD-10-CM | POA: Diagnosis not present

## 2016-10-01 DIAGNOSIS — M65872 Other synovitis and tenosynovitis, left ankle and foot: Secondary | ICD-10-CM | POA: Diagnosis not present

## 2016-10-01 DIAGNOSIS — M2142 Flat foot [pes planus] (acquired), left foot: Secondary | ICD-10-CM | POA: Diagnosis not present

## 2016-11-18 DIAGNOSIS — Z5181 Encounter for therapeutic drug level monitoring: Secondary | ICD-10-CM | POA: Diagnosis not present

## 2017-02-07 ENCOUNTER — Ambulatory Visit (INDEPENDENT_AMBULATORY_CARE_PROVIDER_SITE_OTHER): Payer: BLUE CROSS/BLUE SHIELD | Admitting: Physician Assistant

## 2017-02-07 ENCOUNTER — Encounter: Payer: Self-pay | Admitting: Physician Assistant

## 2017-02-07 VITALS — BP 130/74 | HR 71 | Temp 98.2°F | Resp 14 | Ht 63.75 in | Wt 159.0 lb

## 2017-02-07 DIAGNOSIS — E785 Hyperlipidemia, unspecified: Secondary | ICD-10-CM | POA: Diagnosis not present

## 2017-02-07 DIAGNOSIS — Z1159 Encounter for screening for other viral diseases: Secondary | ICD-10-CM | POA: Diagnosis not present

## 2017-02-07 DIAGNOSIS — Z9189 Other specified personal risk factors, not elsewhere classified: Secondary | ICD-10-CM

## 2017-02-07 DIAGNOSIS — Z0001 Encounter for general adult medical examination with abnormal findings: Secondary | ICD-10-CM

## 2017-02-07 DIAGNOSIS — S161XXA Strain of muscle, fascia and tendon at neck level, initial encounter: Secondary | ICD-10-CM | POA: Insufficient documentation

## 2017-02-07 DIAGNOSIS — Z0000 Encounter for general adult medical examination without abnormal findings: Secondary | ICD-10-CM

## 2017-02-07 LAB — CBC
HCT: 42.6 % (ref 36.0–46.0)
Hemoglobin: 13.9 g/dL (ref 12.0–15.0)
MCHC: 32.6 g/dL (ref 30.0–36.0)
MCV: 89.6 fl (ref 78.0–100.0)
Platelets: 345 10*3/uL (ref 150.0–400.0)
RBC: 4.75 Mil/uL (ref 3.87–5.11)
RDW: 13.1 % (ref 11.5–15.5)
WBC: 5.4 10*3/uL (ref 4.0–10.5)

## 2017-02-07 LAB — COMPREHENSIVE METABOLIC PANEL
ALT: 13 U/L (ref 0–35)
AST: 22 U/L (ref 0–37)
Albumin: 4.6 g/dL (ref 3.5–5.2)
Alkaline Phosphatase: 100 U/L (ref 39–117)
BUN: 12 mg/dL (ref 6–23)
CO2: 29 mEq/L (ref 19–32)
Calcium: 9.9 mg/dL (ref 8.4–10.5)
Chloride: 104 mEq/L (ref 96–112)
Creatinine, Ser: 0.56 mg/dL (ref 0.40–1.20)
GFR: 138.12 mL/min (ref >60.00)
Glucose, Bld: 94 mg/dL (ref 70–99)
Potassium: 5.1 mEq/L (ref 3.5–5.1)
Sodium: 139 mEq/L (ref 135–145)
Total Bilirubin: 0.4 mg/dL (ref 0.2–1.2)
Total Protein: 7.9 g/dL (ref 6.0–8.3)

## 2017-02-07 LAB — LIPID PANEL
Cholesterol: 216 mg/dL — ABNORMAL HIGH (ref 0–200)
HDL: 66.1 mg/dL (ref >39.00)
LDL Cholesterol: 139 mg/dL — ABNORMAL HIGH (ref 0–99)
NonHDL: 150.32
Total CHOL/HDL Ratio: 3
Triglycerides: 58 mg/dL (ref 0.0–149.0)
VLDL: 11.6 mg/dL (ref 0.0–40.0)

## 2017-02-07 LAB — HEMOGLOBIN A1C: Hgb A1c MFr Bld: 6 % (ref 4.6–6.5)

## 2017-02-07 NOTE — Assessment & Plan Note (Addendum)
Tenderness and tension with palpation. No bony tenderness. ROM within normal limits. Some reproduced pain with lateral bending of head. Melanie Lee is 71 years old. Recommended new pillow for side sleeping. Topical medication and supportive measures reviewed. Follow-up if not resolving.

## 2017-02-07 NOTE — Progress Notes (Signed)
Patient presents to clinic today for annual exam.  Patient is fasting for labs.  Acute Concerns: Endorses some tension in the R posterior neck over the past couple of weeks. Endorses worse in the morning on waking and improves some during the day. Denies trauma or injury. Denies decreased ROM. Has not taken anything for symptoms.  Chronic Issues: Hyperlipidemia -- Patient currently on Pravachol 20 mg daily. Is taking as directed along with 81 mg ASA daily. Is trying to stay as active as possible.   Health Maintenance: Immunizations -- up-to-date. Colonoscopy -- up-to-date. Mammogram -- up-to-date. Bone Density -- Due for repeat. Order will be placed. Hep C screening -- Wishes to have today.  Past Medical History:  Diagnosis Date  . Elevated blood pressure (not hypertension)    "elevates sometimes with MD visits"  . Frequent headaches    04-19-16 "no longer bothered"- since no cholesterol med   . 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 ABDOMINAL HYSTERECTOMY  1995  . BREAST BIOPSY  1995  . CATARACT EXTRACTION  2013 & 2015   Bilateral  . KNEE SURGERY     Left Arthroscopy  . 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 Calcium Carbonate-Vitamin D (CALCIUM-VITAMIN D) 500-200 MG-UNIT tablet Take 1 tablet by mouth daily.    . fluticasone (FLONASE) 50 MCG/ACT nasal spray Place 2 sprays into both nostrils daily. 16 g 0  . Multiple Vitamins-Minerals (MULTIVITAMIN ADULTS 50+) TABS Take 1 tablet by mouth daily.     . pravastatin (PRAVACHOL) 20 MG tablet TAKE ONE TABLET BY MOUTH DAILY 30 tablet 3   No current facility-administered medications on file prior to visit.      Allergies  Allergen Reactions  . Tramadol Nausea Only and Other (See Comments)    Dizziness    Family History  Problem Relation Age of Onset  . Breast cancer Mother 45    Deceased  . Heart disease Mother   . Hypertension Mother   . Hypertension Father     Deceased  . Stroke Father   . Hypertension Sister   . Hypertension Brother   . Heart defect Daughter     Social History   Social History  . Marital status: Single    Spouse name: N/A  . Number of children: N/A  . Years of education: N/A   Occupational History  . Not on file.   Social History Main Topics  . Smoking status: Never Smoker  . Smokeless tobacco: Never Used  . Alcohol use 0.0 oz/week     Comment: rare  . Drug use: No  . Sexual activity: Not Currently   Other Topics Concern  . 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   Review of Systems  Constitutional: Negative for fever and weight loss.  HENT: Negative for ear discharge, ear pain, hearing loss and tinnitus.   Eyes: Negative for blurred vision, double vision, photophobia and pain.  Respiratory: Negative for cough and shortness of breath.   Cardiovascular: Negative for chest pain and palpitations.  Gastrointestinal: Negative for abdominal pain, blood in stool, constipation, diarrhea, heartburn, melena, nausea and vomiting.  Genitourinary: Negative for dysuria, flank pain, frequency, hematuria and urgency.  Musculoskeletal: Positive for neck pain. Negative for back pain, falls, joint pain and myalgias.  Neurological: Negative for dizziness, loss of consciousness and headaches.  Endo/Heme/Allergies: Negative for environmental allergies.  Psychiatric/Behavioral: Negative for depression, hallucinations, substance abuse and suicidal ideas. The patient is not nervous/anxious and does not have insomnia.    BP 130/74   Pulse 71   Temp 98.2 F (36.8 C) (Oral)   Resp 14   Ht 5' 3.75" (1.619 m)   Wt 159 lb (72.1 kg)    SpO2 98%   BMI 27.51 kg/m   Physical Exam  Constitutional: She is oriented to person, place, and time and well-developed, well-nourished, and in no distress.  HENT:  Head: Normocephalic and atraumatic.  Right Ear: Tympanic membrane, external ear and ear canal normal.  Left Ear: Tympanic membrane, external ear and ear canal normal.  Nose: Nose normal. No mucosal edema.  Mouth/Throat: Uvula is midline, oropharynx is clear and moist and mucous membranes are normal. No oropharyngeal exudate or posterior oropharyngeal erythema.  Eyes: Conjunctivae are normal. Pupils are equal, round, and reactive to light.  Neck: Normal range of motion. Neck supple. Muscular tenderness present. No spinous process tenderness present. Normal range of motion present. No thyromegaly present.  Cardiovascular: Normal rate, regular rhythm, normal heart sounds and intact distal pulses.   Pulmonary/Chest: Effort normal and breath sounds normal. No respiratory distress. She has no wheezes. She has no rales.  Abdominal: Soft. Bowel sounds are normal. She exhibits no distension and no mass. There is no tenderness. There is no rebound and no guarding.  Lymphadenopathy:    She has no cervical adenopathy.  Neurological: She is alert and oriented to person, place, and time. No cranial nerve deficit.  Skin: Skin is warm and dry. No rash noted.  Psychiatric: Affect normal.  Vitals reviewed.  Assessment/Plan: Visit for preventive health examination Depression screen negative. Health Maintenance reviewed -- Hep C screening today. Will place order for repeat bone density. Immunizations up-to-date. Preventive schedule discussed and handout given in AVS. Will obtain fasting labs today.   Hyperlipidemia Repeat labs today. Will alter regimen accordingly.   Strain of cervical portion of right trapezius muscle Tenderness and tension with palpation. No bony tenderness. ROM within normal limits. Some reproduced pain with lateral  bending of head. Adrian Prince is 71 years old. Recommended new pillow for side sleeping. Topical medication and supportive measures reviewed. Follow-up if not resolving.     Leeanne Rio, PA-C

## 2017-02-07 NOTE — Assessment & Plan Note (Signed)
Repeat labs today. Will alter regimen accordingly.

## 2017-02-07 NOTE — Assessment & Plan Note (Signed)
Depression screen negative. Health Maintenance reviewed -- Hep C screening today. Will place order for repeat bone density. Immunizations up-to-date. Preventive schedule discussed and handout given in AVS. Will obtain fasting labs today.

## 2017-02-07 NOTE — Patient Instructions (Signed)
Please go to the lab for blood work.   Our office will call you with your results unless you have chosen to receive results via MyChart.  If your blood work is normal we will follow-up each year for physicals and as scheduled for chronic medical problems.  If anything is abnormal we will treat accordingly and get you in for a follow-up.  Please continue medications as directed.  As we discussed, please get a new pillow to sleep on to give better support to the neck.  Apply topical Icy Hot or Aspercreme to the area.  Let me know if symptoms are not improving.    Preventive Care 69 Years and Older, Female Preventive care refers to lifestyle choices and visits with your health care provider that can promote health and wellness. What does preventive care include?  A yearly physical exam. This is also called an annual well check.  Dental exams once or twice a year.  Routine eye exams. Ask your health care provider how often you should have your eyes checked.  Personal lifestyle choices, including:  Daily care of your teeth and gums.  Regular physical activity.  Eating a healthy diet.  Avoiding tobacco and drug use.  Limiting alcohol use.  Practicing safe sex.  Taking low-dose aspirin every day.  Taking vitamin and mineral supplements as recommended by your health care provider. What happens during an annual well check? The services and screenings done by your health care provider during your annual well check will depend on your age, overall health, lifestyle risk factors, and family history of disease. Counseling  Your health care provider may ask you questions about your:  Alcohol use.  Tobacco use.  Drug use.  Emotional well-being.  Home and relationship well-being.  Sexual activity.  Eating habits.  History of falls.  Memory and ability to understand (cognition).  Work and work environment.  Reproductive health. Screening  You may have the following  tests or measurements:  Height, weight, and BMI.  Blood pressure.  Lipid and cholesterol levels. These may be checked every 5 years, or more frequently if you are over 50 years old.  Skin check.  Lung cancer screening. You may have this screening every year starting at age 55 if you have a 30-pack-year history of smoking and currently smoke or have quit within the past 15 years.  Fecal occult blood test (FOBT) of the stool. You may have this test every year starting at age 50.  Flexible sigmoidoscopy or colonoscopy. You may have a sigmoidoscopy every 5 years or a colonoscopy every 10 years starting at age 50.  Hepatitis C blood test.  Hepatitis B blood test.  Sexually transmitted disease (STD) testing.  Diabetes screening. This is done by checking your blood sugar (glucose) after you have not eaten for a while (fasting). You may have this done every 1-3 years.  Bone density scan. This is done to screen for osteoporosis. You may have this done starting at age 65.  Mammogram. This may be done every 1-2 years. Talk to your health care provider about how often you should have regular mammograms. Talk with your health care provider about your test results, treatment options, and if necessary, the need for more tests. Vaccines  Your health care provider may recommend certain vaccines, such as:  Influenza vaccine. This is recommended every year.  Tetanus, diphtheria, and acellular pertussis (Tdap, Td) vaccine. You may need a Td booster every 10 years.  Varicella vaccine. You may need this   if you have not been vaccinated.  Zoster vaccine. You may need this after age 7.  Measles, mumps, and rubella (MMR) vaccine. You may need at least one dose of MMR if you were born in 1957 or later. You may also need a second dose.  Pneumococcal 13-valent conjugate (PCV13) vaccine. One dose is recommended after age 26.  Pneumococcal polysaccharide (PPSV23) vaccine. One dose is recommended after  age 16.  Meningococcal vaccine. You may need this if you have certain conditions.  Hepatitis A vaccine. You may need this if you have certain conditions or if you travel or work in places where you may be exposed to hepatitis A.  Hepatitis B vaccine. You may need this if you have certain conditions or if you travel or work in places where you may be exposed to hepatitis B.  Haemophilus influenzae type b (Hib) vaccine. You may need this if you have certain conditions. Talk to your health care provider about which screenings and vaccines you need and how often you need them. This information is not intended to replace advice given to you by your health care provider. Make sure you discuss any questions you have with your health care provider. Document Released: 10/27/2015 Document Revised: 06/19/2016 Document Reviewed: 08/01/2015 Elsevier Interactive Patient Education  2017 [employer].

## 2017-02-07 NOTE — Progress Notes (Signed)
Pre visit review using our clinic review tool, if applicable. No additional management support is needed unless otherwise documented below in the visit note. 

## 2017-02-08 LAB — HEPATITIS C ANTIBODY: HCV Ab: NEGATIVE

## 2017-02-13 ENCOUNTER — Telehealth: Payer: Self-pay | Admitting: Physician Assistant

## 2017-02-13 NOTE — Telephone Encounter (Signed)
Patient's daughter Levada Dy returning call for results on behalf of patient.  No CMA was available at the time of the call.  Advised pt's daughter someone will call her back with results.   Due to patient working during the day, please leave detailed message regarding results on patient voicemail, tel # [phone].

## 2017-02-14 NOTE — Telephone Encounter (Signed)
Left detailed message on voicemail per daughter. Left detailed message on voicemail if patient is taking Pravastatin medication daily without missing. Will need to increase dosage if is taking daily. See lab results for details.

## 2017-02-21 ENCOUNTER — Other Ambulatory Visit: Payer: Self-pay | Admitting: Physician Assistant

## 2017-02-21 ENCOUNTER — Encounter: Payer: Self-pay | Admitting: Emergency Medicine

## 2017-02-21 DIAGNOSIS — E785 Hyperlipidemia, unspecified: Secondary | ICD-10-CM

## 2017-02-21 MED ORDER — PRAVASTATIN SODIUM 40 MG PO TABS: 20.0000 mg | ORAL_TABLET | Freq: Every day | ORAL | 1 refills | Status: DC

## 2017-03-21 DIAGNOSIS — Z96652 Presence of left artificial knee joint: Secondary | ICD-10-CM | POA: Diagnosis not present

## 2017-03-21 DIAGNOSIS — M1711 Unilateral primary osteoarthritis, right knee: Secondary | ICD-10-CM | POA: Diagnosis not present

## 2017-06-01 ENCOUNTER — Other Ambulatory Visit: Payer: Self-pay | Admitting: Physician Assistant

## 2017-06-01 DIAGNOSIS — R42 Dizziness and giddiness: Secondary | ICD-10-CM

## 2017-08-06 DIAGNOSIS — Z23 Encounter for immunization: Secondary | ICD-10-CM | POA: Diagnosis not present

## 2017-08-07 ENCOUNTER — Other Ambulatory Visit: Payer: Self-pay | Admitting: Physician Assistant

## 2017-08-07 DIAGNOSIS — E785 Hyperlipidemia, unspecified: Secondary | ICD-10-CM

## 2017-09-24 ENCOUNTER — Telehealth: Payer: Self-pay | Admitting: Family Medicine

## 2017-09-24 NOTE — Telephone Encounter (Signed)
Please schedule patient at Dr. Charlett Blake next available new patient appointment

## 2017-09-24 NOTE — Telephone Encounter (Signed)
Copied from [place] [id number]. Topic: Appointment Scheduling - Scheduling Inquiry for Clinic >> Sep 23, 2017  8:53 AM Ether Griffins B wrote: Reason for CRM: pt would like to establish care with Dr. Charlett Blake  I will accept patient

## 2017-09-30 NOTE — Telephone Encounter (Signed)
Patient scheduled for 08/20/18

## 2017-11-06 ENCOUNTER — Ambulatory Visit: Payer: BLUE CROSS/BLUE SHIELD | Admitting: Family Medicine

## 2017-11-06 ENCOUNTER — Encounter: Payer: Self-pay | Admitting: Family Medicine

## 2017-11-06 VITALS — BP 112/68 | HR 74 | Temp 98.5°F | Resp 18 | Ht 64.0 in | Wt 150.2 lb

## 2017-11-06 DIAGNOSIS — N3000 Acute cystitis without hematuria: Secondary | ICD-10-CM

## 2017-11-06 DIAGNOSIS — E785 Hyperlipidemia, unspecified: Secondary | ICD-10-CM

## 2017-11-06 DIAGNOSIS — M170 Bilateral primary osteoarthritis of knee: Secondary | ICD-10-CM | POA: Diagnosis not present

## 2017-11-06 DIAGNOSIS — R3911 Hesitancy of micturition: Secondary | ICD-10-CM | POA: Diagnosis not present

## 2017-11-06 DIAGNOSIS — M2550 Pain in unspecified joint: Secondary | ICD-10-CM

## 2017-11-06 DIAGNOSIS — R739 Hyperglycemia, unspecified: Secondary | ICD-10-CM

## 2017-11-06 DIAGNOSIS — R5383 Other fatigue: Secondary | ICD-10-CM | POA: Diagnosis not present

## 2017-11-06 NOTE — Assessment & Plan Note (Signed)
Encouraged heart healthy diet, increase exercise, avoid trans fats, consider a krill oil cap daily 

## 2017-11-06 NOTE — Assessment & Plan Note (Signed)
Encouraged moist heat and gentle stretching as tolerated. May try NSAIDs and prescription meds as directed and report if symptoms worsen or seek immediate care 

## 2017-11-06 NOTE — Assessment & Plan Note (Signed)
Check cbc and cmp 

## 2017-11-06 NOTE — Progress Notes (Signed)
Subjective:  I acted as a Education administrator for Dr. Charlett Blake. Melanie Lee, Melanie Lee  Patient ID: Melanie Lee, female    DOB: 1945-11-22, 72 y.o.   MRN: [id number]  No chief complaint on file.   HPI  Patient is in today to establish care with provider. Is transferring from another office. She has several concerns. She notes her right 5 th finger has been painful and swollen over past weei. No trauma. She has other arthralgias as well but these are more long standing. She also notes a recent increase in urinary hesitancy.and urgency. No dysuria or hematuria. No recent febrile illness or hospitalizations. Denies CP/palp/SOB/HA/congestion/fevers/GI c/o. Taking meds as prescribed  Patient Care Team: Mosie Lukes, MD as PCP - General (Family Medicine) Hale Bogus., MD as Referring Physician (Gastroenterology)   Past Medical History:  Diagnosis Date  . Elevated blood pressure (not hypertension)    "elevates sometimes with MD visits"  . Frequent headaches    04-19-16 "no longer bothered"- since no cholesterol med   . H/O measles   . 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 ABDOMINAL HYSTERECTOMY  1995   total for fibroids  . BREAST BIOPSY  1995   benign  . CATARACT EXTRACTION  2013 & 2015   Bilateral  . KNEE SURGERY     Left Arthroscopy  . 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 Breast cancer Mother 71       Deceased  . Heart disease Mother   . Hypertension Mother   . Hypertension Father        Deceased  . Stroke Father   . Hypertension Sister   . Hypertension Brother   . Heart defect Daughter   . Hyperlipidemia Daughter   . Hypertension Daughter   . Vision loss Paternal Uncle   . Stroke Brother     Social  History   Socioeconomic History  . Marital status: Single    Spouse name: Not on file  . Number of children: Not on file  . Years of education: Not on file  . Highest education level: Not on file  Social Needs  . Financial resource strain: Not on file  . Food insecurity - worry: Not on file  . Food insecurity - inability: Not on file  . Transportation needs - medical: Not on file  . Transportation needs - non-medical: Not on file  Occupational History  . Not on file  Tobacco Use  . Smoking status: Never Smoker  . Smokeless tobacco: Never Used  Substance and Sexual Activity  . Alcohol use: Yes    Alcohol/week: 0.0 oz    Comment: rare  . Drug use: No  . Sexual activity: Not Currently  Other Topics Concern  . 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    Outpatient Medications Prior to Visit  Medication Sig Dispense Refill  . Calcium Carbonate-Vitamin D (CALCIUM-VITAMIN D) 500-200 MG-UNIT tablet Take 1 tablet by mouth daily.    . fluticasone (FLONASE) 50 MCG/ACT nasal spray INHALE 2 SPRAYS IN EACH NOSTRIL DAILY 16 g 3  . Multiple Vitamins-Minerals (MULTIVITAMIN ADULTS 50+) TABS Take 1 tablet by mouth daily.     Marland Kitchen  pravastatin (PRAVACHOL) 40 MG tablet Take 1 tablet (40 mg total) by mouth daily. 30 tablet 0   No facility-administered medications prior to visit.     Allergies  Allergen Reactions  . Tramadol Nausea Only and Other (See Comments)    Dizziness    Review of Systems  Constitutional: Negative for fever and malaise/fatigue.  HENT: Negative for congestion.   Eyes: Negative for blurred vision.  Respiratory: Negative for cough and shortness of breath.   Cardiovascular: Negative for chest pain, palpitations and leg swelling.  Gastrointestinal: Negative for vomiting.  Genitourinary: Positive for urgency.  Musculoskeletal: Positive for joint pain. Negative for back pain.  Skin: Negative for rash.  Neurological: Negative for loss of  consciousness and headaches.       Objective:    Physical Exam  Constitutional: She is oriented to person, place, and time. She appears well-developed and well-nourished. No distress.  HENT:  Head: Normocephalic and atraumatic.  Eyes: Conjunctivae are normal.  Neck: Normal range of motion. No thyromegaly present.  Cardiovascular: Normal rate and regular rhythm.  Pulmonary/Chest: Effort normal and breath sounds normal. She has no wheezes.  Abdominal: Soft. Bowel sounds are normal. There is no tenderness.  Musculoskeletal: Normal range of motion. She exhibits no edema or deformity.  Neurological: She is alert and oriented to person, place, and time.  Skin: Skin is warm and dry. She is not diaphoretic.  Psychiatric: She has a normal mood and affect.    BP 112/68 (BP Location: Left Arm, Patient Position: Sitting, Cuff Size: Normal)   Pulse 74   Temp 98.5 F (36.9 C) (Oral)   Resp 18   Ht 5' 4"  (1.626 m)   Wt 150 lb 3.2 oz (68.1 kg)   SpO2 95%   BMI 25.78 kg/m  Wt Readings from Last 3 Encounters:  11/06/17 150 lb 3.2 oz (68.1 kg)  02/07/17 159 lb (72.1 kg)  06/24/16 154 lb 4 oz (70 kg)   BP Readings from Last 3 Encounters:  11/06/17 112/68  02/07/17 130/74  06/24/16 110/76     Immunization History  Administered Date(s) Administered  . Influenza,inj,Quad PF,6+ Mos 06/24/2016  . Pneumococcal Conjugate-13 10/28/2014  . Pneumococcal Polysaccharide-23 01/01/2016  . Tdap 04/01/2016  . Zoster 03/21/2015    Health Maintenance  Topic Date Due  . MAMMOGRAM  07/04/2018  . COLONOSCOPY  11/26/2025  . TETANUS/TDAP  04/01/2026  . INFLUENZA VACCINE  Completed  . DEXA SCAN  Completed  . Hepatitis C Screening  Completed  . PNA vac Low Risk Adult  Completed    Lab Results  Component Value Date   WBC 8.1 11/06/2017   HGB 12.7 11/06/2017   HCT 39.2 11/06/2017   PLT 437.0 (H) 11/06/2017   GLUCOSE 80 11/06/2017   CHOL 204 (H) 11/06/2017   TRIG 64.0 11/06/2017   HDL 54.50  11/06/2017   LDLCALC 137 (H) 11/06/2017   ALT 13 11/06/2017   AST 22 11/06/2017   NA 142 11/06/2017   K 3.9 11/06/2017   CL 102 11/06/2017   CREATININE 0.57 11/06/2017   BUN 10 11/06/2017   CO2 28 11/06/2017   TSH 2.30 11/06/2017   INR 1.03 04/19/2016   HGBA1C 5.9 11/06/2017    Lab Results  Component Value Date   TSH 2.30 11/06/2017   Lab Results  Component Value Date   WBC 8.1 11/06/2017   HGB 12.7 11/06/2017   HCT 39.2 11/06/2017   MCV 90.1 11/06/2017   PLT 437.0 (H) 11/06/2017  Lab Results  Component Value Date   NA 142 11/06/2017   K 3.9 11/06/2017   CHLORIDE 105 06/03/2016   CO2 28 11/06/2017   GLUCOSE 80 11/06/2017   BUN 10 11/06/2017   CREATININE 0.57 11/06/2017   BILITOT 0.6 11/06/2017   ALKPHOS 77 11/06/2017   AST 22 11/06/2017   ALT 13 11/06/2017   PROT 8.4 (H) 11/06/2017   ALBUMIN 4.4 11/06/2017   CALCIUM 9.9 11/06/2017   ANIONGAP 10 06/03/2016   EGFR >90 06/03/2016   GFR 135.03 11/06/2017   Lab Results  Component Value Date   CHOL 204 (H) 11/06/2017   Lab Results  Component Value Date   HDL 54.50 11/06/2017   Lab Results  Component Value Date   LDLCALC 137 (H) 11/06/2017   Lab Results  Component Value Date   TRIG 64.0 11/06/2017   Lab Results  Component Value Date   CHOLHDL 4 11/06/2017   Lab Results  Component Value Date   HGBA1C 5.9 11/06/2017         Assessment & Plan:   Problem List Items Addressed This Visit    Primary osteoarthritis of both knees (Chronic)    Encouraged moist heat and gentle stretching as tolerated. May try NSAIDs and prescription meds as directed and report if symptoms worsen or seek immediate care      Relevant Orders   CBC (Completed)   Other fatigue    Check cbc and cmp      Relevant Orders   CBC (Completed)   Comprehensive metabolic panel (Completed)   Hyperlipidemia    Encouraged heart healthy diet, increase exercise, avoid trans fats, consider a krill oil cap daily      Relevant  Orders   Lipid panel (Completed)   TSH (Completed)   Comprehensive metabolic panel (Completed)   Rheumatoid Factor (Completed)   UTI (urinary tract infection)    Notes urinary hesitancy, culture grows Ecoli started on Cefdinir and probiotics      Hyperglycemia    hgba1c acceptable, minimize simple carbs. Increase exercise as tolerated      Relevant Orders   Hemoglobin A1c (Completed)   Arthralgia    RF negative. Encouraged clean diet, regular exercise and adequate sleep. If no improvement with OTC Tylenol and lidocaine prn then will need referral to rheumatology       Other Visit Diagnoses    Urinary hesitancy    -  Primary   Relevant Orders   CBC (Completed)   Urinalysis (Completed)   Urine Culture (Completed)      I am having Melanie Lee maintain her MULTIVITAMIN ADULTS 50+, calcium-vitamin D, fluticasone, and pravastatin.  No orders of the defined types were placed in this encounter.   CMA served as Education administrator during this visit. History, Physical and Plan performed by medical provider. Documentation and orders reviewed and attested to.  Penni Homans, MD

## 2017-11-06 NOTE — Patient Instructions (Signed)
shingrix is the new shingles shot, 2 shots over 2-6 months, get at pharmacy Cholesterol Cholesterol is a fat. Your body needs a small amount of cholesterol. Cholesterol (plaque) may build up in your blood vessels (arteries). That makes you more likely to have a heart attack or stroke. You cannot feel your cholesterol level. Having a blood test is the only way to find out if your level is high. Keep your test results. Work with your doctor to keep your cholesterol at a good level. What do the results mean?  Total cholesterol is how much cholesterol is in your blood.  LDL is bad cholesterol. This is the type that can build up. Try to have low LDL.  HDL is good cholesterol. It cleans your blood vessels and carries LDL away. Try to have high HDL.  Triglycerides are fat that the body can store or burn for energy. What are good levels of cholesterol?  Total cholesterol below 200.  LDL below 100 is good for people who have health risks. LDL below 70 is good for people who have very high risks.  HDL above 40 is good. It is best to have HDL of 60 or higher.  Triglycerides below 150. How can I lower my cholesterol? Diet Follow your diet program as told by your doctor.  Choose fish, white meat chicken, or [place] that is roasted or baked. Try not to eat red meat, fried foods, sausage, or lunch meats.  Eat lots of fresh fruits and vegetables.  Choose whole grains, beans, pasta, potatoes, and cereals.  Choose olive oil, corn oil, or canola oil. Only use small amounts.  Try not to eat butter, mayonnaise, shortening, or palm kernel oils.  Try not to eat foods with trans fats.  Choose low-fat or nonfat dairy foods. ? Drink skim or nonfat milk. ? Eat low-fat or nonfat yogurt and cheeses. ? Try not to drink whole milk or cream. ? Try not to eat ice cream, egg yolks, or full-fat cheeses.  Healthy desserts include angel food cake, ginger snaps, animal crackers, hard candy, popsicles, and  low-fat or nonfat frozen yogurt. Try not to eat pastries, cakes, pies, and cookies.  Exercise Follow your exercise program as told by your doctor.  Be more active. Try gardening, walking, and taking the stairs.  Ask your doctor about ways that you can be more active.  Medicine  Take over-the-counter and prescription medicines only as told by your doctor. This information is not intended to replace advice given to you by your health care provider. Make sure you discuss any questions you have with your health care provider. Document Released: 12/27/2008 Document Revised: 05/01/2016 Document Reviewed: 04/11/2016 Elsevier Interactive Patient Education  [employer].

## 2017-11-07 LAB — URINALYSIS
Bilirubin Urine: NEGATIVE
Hgb urine dipstick: NEGATIVE
Ketones, ur: 15 — AB
Leukocytes, UA: NEGATIVE
Nitrite: NEGATIVE
Specific Gravity, Urine: >=1.03 — AB (ref 1.000–1.030)
Total Protein, Urine: NEGATIVE
Urine Glucose: NEGATIVE
Urobilinogen, UA: 0.2 (ref 0.0–1.0)
pH: 6 (ref 5.0–8.0)

## 2017-11-07 LAB — COMPREHENSIVE METABOLIC PANEL
ALT: 13 U/L (ref 0–35)
AST: 22 U/L (ref 0–37)
Albumin: 4.4 g/dL (ref 3.5–5.2)
Alkaline Phosphatase: 77 U/L (ref 39–117)
BUN: 10 mg/dL (ref 6–23)
CO2: 28 mEq/L (ref 19–32)
Calcium: 9.9 mg/dL (ref 8.4–10.5)
Chloride: 102 mEq/L (ref 96–112)
Creatinine, Ser: 0.57 mg/dL (ref 0.40–1.20)
GFR: 135.03 mL/min (ref >60.00)
Glucose, Bld: 80 mg/dL (ref 70–99)
Potassium: 3.9 mEq/L (ref 3.5–5.1)
Sodium: 142 mEq/L (ref 135–145)
Total Bilirubin: 0.6 mg/dL (ref 0.2–1.2)
Total Protein: 8.4 g/dL — ABNORMAL HIGH (ref 6.0–8.3)

## 2017-11-07 LAB — CBC
HCT: 39.2 % (ref 36.0–46.0)
Hemoglobin: 12.7 g/dL (ref 12.0–15.0)
MCHC: 32.5 g/dL (ref 30.0–36.0)
MCV: 90.1 fl (ref 78.0–100.0)
Platelets: 437 10*3/uL — ABNORMAL HIGH (ref 150.0–400.0)
RBC: 4.36 Mil/uL (ref 3.87–5.11)
RDW: 12.9 % (ref 11.5–15.5)
WBC: 8.1 10*3/uL (ref 4.0–10.5)

## 2017-11-07 LAB — LIPID PANEL
Cholesterol: 204 mg/dL — ABNORMAL HIGH (ref 0–200)
HDL: 54.5 mg/dL (ref >39.00)
LDL Cholesterol: 137 mg/dL — ABNORMAL HIGH (ref 0–99)
NonHDL: 149.45
Total CHOL/HDL Ratio: 4
Triglycerides: 64 mg/dL (ref 0.0–149.0)
VLDL: 12.8 mg/dL (ref 0.0–40.0)

## 2017-11-07 LAB — HEMOGLOBIN A1C: Hgb A1c MFr Bld: 5.9 % (ref 4.6–6.5)

## 2017-11-07 LAB — RHEUMATOID FACTOR: Rhuematoid fact SerPl-aCnc: <14 IU/mL (ref <14)

## 2017-11-07 LAB — TSH: TSH: 2.3 u[IU]/mL (ref 0.35–4.50)

## 2017-11-09 DIAGNOSIS — M2550 Pain in unspecified joint: Secondary | ICD-10-CM | POA: Insufficient documentation

## 2017-11-09 DIAGNOSIS — M25551 Pain in right hip: Secondary | ICD-10-CM | POA: Insufficient documentation

## 2017-11-09 DIAGNOSIS — R739 Hyperglycemia, unspecified: Secondary | ICD-10-CM | POA: Insufficient documentation

## 2017-11-09 DIAGNOSIS — R32 Unspecified urinary incontinence: Secondary | ICD-10-CM | POA: Insufficient documentation

## 2017-11-09 DIAGNOSIS — N390 Urinary tract infection, site not specified: Secondary | ICD-10-CM | POA: Insufficient documentation

## 2017-11-09 LAB — URINE CULTURE
MICRO NUMBER:: 90102400
SPECIMEN QUALITY:: ADEQUATE

## 2017-11-09 NOTE — Assessment & Plan Note (Signed)
RF negative. Encouraged clean diet, regular exercise and adequate sleep. If no improvement with OTC Tylenol and lidocaine prn then will need referral to rheumatology

## 2017-11-09 NOTE — Assessment & Plan Note (Signed)
hgba1c acceptable, minimize simple carbs. Increase exercise as tolerated.  

## 2017-11-09 NOTE — Assessment & Plan Note (Signed)
Notes urinary hesitancy, culture grows Ecoli started on Cefdinir and probiotics

## 2017-11-10 MED ORDER — CEFDINIR 300 MG PO CAPS: 300.0000 mg | ORAL_CAPSULE | Freq: Two times a day (BID) | ORAL | 0 refills | Status: DC

## 2017-11-10 NOTE — Addendum Note (Signed)
Addended by: Magdalene Molly A on: 11/10/2017 12:13 PM   Modules accepted: Orders

## 2017-12-05 ENCOUNTER — Other Ambulatory Visit: Payer: Self-pay | Admitting: Family Medicine

## 2017-12-05 DIAGNOSIS — Z1231 Encounter for screening mammogram for malignant neoplasm of breast: Secondary | ICD-10-CM

## 2017-12-06 ENCOUNTER — Ambulatory Visit (HOSPITAL_BASED_OUTPATIENT_CLINIC_OR_DEPARTMENT_OTHER)
Admission: RE | Admit: 2017-12-06 | Discharge: 2017-12-06 | Disposition: A | Discharge status: Home / Self Care | Payer: BLUE CROSS/BLUE SHIELD | Source: Ambulatory Visit | Attending: Family Medicine | Admitting: Family Medicine

## 2017-12-06 DIAGNOSIS — R921 Mammographic calcification found on diagnostic imaging of breast: Secondary | ICD-10-CM | POA: Diagnosis not present

## 2017-12-06 DIAGNOSIS — Z1231 Encounter for screening mammogram for malignant neoplasm of breast: Secondary | ICD-10-CM | POA: Insufficient documentation

## 2017-12-09 ENCOUNTER — Other Ambulatory Visit: Payer: Self-pay | Admitting: Family Medicine

## 2017-12-09 DIAGNOSIS — R928 Other abnormal and inconclusive findings on diagnostic imaging of breast: Secondary | ICD-10-CM

## 2017-12-10 ENCOUNTER — Telehealth: Payer: Self-pay | Admitting: *Deleted

## 2017-12-10 NOTE — Telephone Encounter (Signed)
Received Physician Orders from [facility]; forwarded to provider/SLS 02/27   

## 2018-01-02 ENCOUNTER — Telehealth: Payer: Self-pay | Admitting: Family Medicine

## 2018-01-02 NOTE — Telephone Encounter (Signed)
Copied from [place] [id number]. Topic: General - Other >> Jan 02, 2018 11:23 AM Carolyn Stare wrote:  Pt has already had her regular mamo and she wastold that she need to have a diagnostic mamo. Pt is requesting to go to [facility] [address]   Pt would like a call back when the referral is placed   [phone]   >> Jan 02, 2018  4:53 PM Cleaster Corin, NT wrote: Pt. Daughter angela calling to check to see if fax was sent pt. Is off Wednesday and wanted to make appt. But needed to make sure fax had been sent. jewelle whitner can be reached at [phone]

## 2018-01-04 DIAGNOSIS — G4489 Other headache syndrome: Secondary | ICD-10-CM | POA: Diagnosis not present

## 2018-01-05 NOTE — Telephone Encounter (Signed)
I have not had someone want to switch midstream before. Probably the easiest way to figure out what we need to do is to call [facility] and explain she already had the first MGM and she needs Diagnostic mgm. Give them whatever order they need to order and let patient know

## 2018-01-05 NOTE — Telephone Encounter (Signed)
[name] the referral has been placed and it was sent to the [place] do we need to do anything different to have it done at [place]    Please advise

## 2018-01-06 ENCOUNTER — Telehealth: Payer: Self-pay | Admitting: Family Medicine

## 2018-01-06 NOTE — Telephone Encounter (Signed)
[name] do I need to do anything different about her ref to [facility] from [place] [facility]   Please advise

## 2018-01-06 NOTE — Telephone Encounter (Signed)
Copied from [place] [id number]. Topic: Inquiry >> Jan 06, 2018  5:15 PM Cecelia Byars, NT wrote: Reason for CRM: Patient called to follow up on request for change in locations for the diagnostic mgm  please call her at  [phone]

## 2018-01-07 NOTE — Telephone Encounter (Signed)
The order just has to be faxed to wherever the patient is going to have the mammogram done

## 2018-01-08 NOTE — Telephone Encounter (Signed)
Are you able to print off the order and fax it for me. I can not locate it  Thanks

## 2018-01-08 NOTE — Telephone Encounter (Signed)
I have scheduled the pt foer 01/13/18 at 10:10 she needs to arrive at 9:50 for check in. [address], can you call her and give the appt info. If for any reason she needs to cancel or reschedule she need to call [phone]

## 2018-01-13 NOTE — Telephone Encounter (Signed)
Can you have this order faxed over to [facility] in [facility]   Please thx

## 2018-01-13 NOTE — Telephone Encounter (Signed)
Patient daughter calling, states that mammogram was to be scheduled at [facility] in [facility]. Can this please be scheduled.

## 2018-01-16 ENCOUNTER — Telehealth: Payer: Self-pay | Admitting: Family Medicine

## 2018-01-16 NOTE — Telephone Encounter (Unsigned)
Copied from [place]. Topic: Quick Communication - See Telephone Encounter >> Jan 16, 2018  2:52 PM Neva Seat wrote: Pt's Daughter has been calling the office for her mother since December 16, 2017 to get orders for her mother to have a diagnostic mammogram done w/ [facility]. She is upset because this hasn't been done yet.  Please fax the order to:  [phone]

## 2018-01-19 NOTE — Telephone Encounter (Signed)
[name] can we refax over the orders to [facility] [phone] thx

## 2018-01-19 NOTE — Telephone Encounter (Signed)
I have called them back several times to let them know that the orders had been switched to the office they want to go to

## 2018-02-04 ENCOUNTER — Telehealth: Payer: Self-pay | Admitting: *Deleted

## 2018-02-04 NOTE — Telephone Encounter (Signed)
[facility] has been unable to reach this patient by phone. Based on her recent breast imaging examination performed on 12/06/17, additional diagnostic evaluation is required at this time. A letter is being sent to [place] for any assistance and/or information you can provide relative to the completion of pt's breast imaging evaluation; forwarded to provider/SLS 04/24

## 2018-02-12 ENCOUNTER — Telehealth: Payer: Self-pay

## 2018-02-12 NOTE — Telephone Encounter (Signed)
Received letter from [facility] stating they have been trying to get in touch with patient about her mammogram.  Patient had mammogram done at [facility].  See telephone note 01/16/18    I've called the patient several times today to get more information about mammogram. Left voicemail for patient to call the office

## 2018-02-13 NOTE — Telephone Encounter (Addendum)
Relation to pt: self  Call back [place]:  Reason for call:  Patient returning call and transferred to [place]

## 2018-02-13 NOTE — Telephone Encounter (Signed)
Noted  

## 2018-02-16 NOTE — Telephone Encounter (Addendum)
Daughter called and states pt does not want to go to [place], she doesn't drive in [place].  Pt wants to go to [facility] Address: [address] [place], [place] [address] [phone] They have called [facility] several times, but they need the order for the diagnostic  mammagram  Pt states has made several calls to have this order sent. Would like asap

## 2018-02-17 NOTE — Telephone Encounter (Signed)
I believe we have sent this order to [facility] and they have been trying to contact this patient,   [name], Please advise on the referral? I know we have sent to [facility] right?

## 2018-02-19 NOTE — Telephone Encounter (Signed)
Order was faxed to [facility], I contacted them and they stated never received the order. I faxed order again. Pt can call them and make sure it was scheduled.

## 2018-03-06 NOTE — Telephone Encounter (Signed)
Patient daughter calling, [facility] has not received order yet. Can this be resent?

## 2018-03-06 NOTE — Telephone Encounter (Signed)
I do not do referrals/orders. [name] usually assists me with them  Meredith Mody can you resend order to [facility] again please

## 2018-05-06 DIAGNOSIS — R920 Mammographic microcalcification found on diagnostic imaging of breast: Secondary | ICD-10-CM | POA: Diagnosis not present

## 2018-05-06 DIAGNOSIS — R928 Other abnormal and inconclusive findings on diagnostic imaging of breast: Secondary | ICD-10-CM | POA: Diagnosis not present

## 2018-05-06 DIAGNOSIS — R921 Mammographic calcification found on diagnostic imaging of breast: Secondary | ICD-10-CM | POA: Diagnosis not present

## 2018-05-06 LAB — HM MAMMOGRAPHY

## 2018-05-15 ENCOUNTER — Encounter: Payer: Self-pay | Admitting: Family Medicine

## 2018-06-02 ENCOUNTER — Encounter: Payer: Self-pay | Admitting: Family Medicine

## 2018-06-02 ENCOUNTER — Ambulatory Visit: Payer: BLUE CROSS/BLUE SHIELD | Admitting: Family Medicine

## 2018-06-02 VITALS — BP 118/68 | HR 70 | Temp 98.9°F | Resp 16 | Ht 64.0 in | Wt 158.4 lb

## 2018-06-02 DIAGNOSIS — R400 Somnolence: Secondary | ICD-10-CM | POA: Diagnosis not present

## 2018-06-02 DIAGNOSIS — G8929 Other chronic pain: Secondary | ICD-10-CM

## 2018-06-02 DIAGNOSIS — E785 Hyperlipidemia, unspecified: Secondary | ICD-10-CM | POA: Diagnosis not present

## 2018-06-02 DIAGNOSIS — R5383 Other fatigue: Secondary | ICD-10-CM

## 2018-06-02 DIAGNOSIS — M25561 Pain in right knee: Secondary | ICD-10-CM

## 2018-06-02 LAB — POC URINALSYSI DIPSTICK (AUTOMATED)
Bilirubin, UA: NEGATIVE
Blood, UA: NEGATIVE
Glucose, UA: NEGATIVE
Ketones, UA: NEGATIVE
Leukocytes, UA: NEGATIVE
Nitrite, UA: NEGATIVE
Protein, UA: NEGATIVE
Spec Grav, UA: 1.025 (ref 1.010–1.025)
Urobilinogen, UA: 0.2 E.U./dL
pH, UA: 6 (ref 5.0–8.0)

## 2018-06-02 NOTE — Progress Notes (Signed)
Patient ID: Melanie Lee, female   DOB: December 07, 1945, 72 y.o.   MRN: [id number]     Subjective:  I acted as a Education administrator for Dr. Carollee Herter.  Guerry Lee, [place]   Patient ID: Melanie Lee, female    DOB: January 08, 1946, 72 y.o.   MRN: [id number]  Chief Complaint  Patient presents with  . Fatigue  . Knee Pain    right      HPI  Patient is in today for fatigue and right knee pain.  She sees ortho for her knee and needs a knee replacement but has to put  It off until she has enough time off at work.  She is in a lot of pain and an injection helped in the past but she does not want to go back to ortho until she is ready for surgery.   Pt feels wiped out and would like labs drawn.  No other symptoms.   Patient Care Team: Mosie Lukes, MD as PCP - General (Family Medicine) Hale Bogus., MD as Referring Physician (Gastroenterology)   Past Medical History:  Diagnosis Date  . Elevated blood pressure (not hypertension)    "elevates sometimes with MD visits"  . Frequent headaches    04-19-16 "no longer bothered"- since no cholesterol med   . H/O measles   . 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 ABDOMINAL HYSTERECTOMY  1995   total for fibroids  . BREAST BIOPSY  1995   benign  . CATARACT EXTRACTION  2013 & 2015   Bilateral  . KNEE SURGERY     Left Arthroscopy  . 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 Breast cancer Mother 41       Deceased  . Heart disease Mother   . Hypertension Mother   . Hypertension Father        Deceased  . Stroke Father   . Hypertension Sister   . Hypertension Brother   . Heart defect Daughter   . Hyperlipidemia Daughter   . Hypertension Daughter   . Vision loss Paternal Uncle   .  Stroke Brother     Social History   Socioeconomic History  . Marital status: Single    Spouse name: Not on file  . Number of children: Not on file  . Years of education: Not on file  . Highest education level: Not on file  Occupational History  . Not on file  Social Needs  . Financial resource strain: Not on file  . Food insecurity:    Worry: Not on file    Inability: Not on file  . Transportation needs:    Medical: Not on file    Non-medical: Not on file  Tobacco Use  . Smoking status: Never Smoker  . Smokeless tobacco: Never Used  Substance and Sexual Activity  . Alcohol use: Yes    Alcohol/week: 0.0 standard drinks    Comment: rare  . Drug use: No  . Sexual activity: Not Currently  Lifestyle  . Physical activity:    Days per week: Not on file    Minutes per session: Not on file  . Stress: Not on file  Relationships  . Social connections:    Talks  on phone: Not on file    Gets together: Not on file    Attends religious service: Not on file    Active member of club or organization: Not on file    Attends meetings of clubs or organizations: Not on file    Relationship status: Not on file  . Intimate partner violence:    Fear of current or ex partner: Not on file    Emotionally abused: Not on file    Physically abused: Not on file    Forced sexual activity: Not on file  Other Topics Concern  . 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    Outpatient Medications Prior to Visit  Medication Sig Dispense Refill  . Multiple Vitamins-Minerals (MULTIVITAMIN ADULTS 50+) TABS Take 1 tablet by mouth daily.     . Calcium Carbonate-Vitamin D (CALCIUM-VITAMIN D) 500-200 MG-UNIT tablet Take 1 tablet by mouth daily.    . cefdinir (OMNICEF) 300 MG capsule Take 1 capsule (300 mg total) by mouth 2 (two) times daily. 10 capsule 0  . fluticasone (FLONASE) 50 MCG/ACT nasal spray INHALE 2 SPRAYS IN EACH NOSTRIL DAILY 16 g 3  . pravastatin (PRAVACHOL) 40  MG tablet Take 1 tablet (40 mg total) by mouth daily. 30 tablet 0   No facility-administered medications prior to visit.     Allergies  Allergen Reactions  . Tramadol Nausea Only and Other (See Comments)    Dizziness    Review of Systems  Constitutional: Positive for malaise/fatigue. Negative for fever.  HENT: Negative for congestion.   Eyes: Negative for blurred vision.  Respiratory: Negative for cough and shortness of breath.   Cardiovascular: Negative for chest pain, palpitations and leg swelling.  Gastrointestinal: Negative for vomiting.  Musculoskeletal: Positive for joint pain (right knee). Negative for back pain.  Skin: Negative for rash.  Neurological: Negative for loss of consciousness and headaches.       Objective:    Physical Exam  Constitutional: She is oriented to person, place, and time. She appears well-developed and well-nourished.  HENT:  Head: Normocephalic and atraumatic.  Eyes: Conjunctivae and EOM are normal.  Neck: Normal range of motion. Neck supple. No JVD present. Carotid bruit is not present. No thyromegaly present.  Cardiovascular: Normal rate, regular rhythm and normal heart sounds.  No murmur heard. Pulmonary/Chest: Effort normal and breath sounds normal. No respiratory distress. She has no wheezes. She has no rales. She exhibits no tenderness.  Musculoskeletal: She exhibits tenderness. She exhibits no edema.  Neurological: She is alert and oriented to person, place, and time.  Psychiatric: She has a normal mood and affect.  Nursing note and vitals reviewed.   BP 118/68 (BP Location: Left Arm, Cuff Size: Normal)   Pulse 70   Temp 98.9 F (37.2 C) (Oral)   Resp 16   Ht 5' 4"  (1.626 m)   Wt 158 lb 6.4 oz (71.8 kg)   SpO2 98%   BMI 27.19 kg/m  Wt Readings from Last 3 Encounters:  06/02/18 158 lb 6.4 oz (71.8 kg)  11/06/17 150 lb 3.2 oz (68.1 kg)  02/07/17 159 lb (72.1 kg)   BP Readings from Last 3 Encounters:  06/02/18 118/68    11/06/17 112/68  02/07/17 130/74     Immunization History  Administered Date(s) Administered  . Influenza,inj,Quad PF,6+ Mos 06/24/2016  . Pneumococcal Conjugate-13 10/28/2014  . Pneumococcal Polysaccharide-23 01/01/2016  . Tdap 04/01/2016  . Zoster 03/21/2015    Health Maintenance  Topic Date Due  . INFLUENZA VACCINE  05/14/2018  . MAMMOGRAM  05/06/2020  . COLONOSCOPY  11/26/2025  . TETANUS/TDAP  04/01/2026  . DEXA SCAN  Completed  . Hepatitis C Screening  Completed  . PNA vac Low Risk Adult  Completed    Lab Results  Component Value Date   WBC 7.4 06/02/2018   HGB 13.8 06/02/2018   HCT 42.9 06/02/2018   PLT 354.0 06/02/2018   GLUCOSE 86 06/02/2018   CHOL 231 (H) 06/02/2018   TRIG 83.0 06/02/2018   HDL 68.50 06/02/2018   LDLCALC 146 (H) 06/02/2018   ALT 12 06/02/2018   AST 21 06/02/2018   NA 139 06/02/2018   K 4.8 06/02/2018   CL 102 06/02/2018   CREATININE 0.59 06/02/2018   BUN 10 06/02/2018   CO2 29 06/02/2018   TSH 1.91 06/02/2018   INR 1.03 04/19/2016   HGBA1C 5.9 11/06/2017    Lab Results  Component Value Date   TSH 1.91 06/02/2018   Lab Results  Component Value Date   WBC 7.4 06/02/2018   HGB 13.8 06/02/2018   HCT 42.9 06/02/2018   MCV 90.1 06/02/2018   PLT 354.0 06/02/2018   Lab Results  Component Value Date   NA 139 06/02/2018   K 4.8 06/02/2018   CHLORIDE 105 06/03/2016   CO2 29 06/02/2018   GLUCOSE 86 06/02/2018   BUN 10 06/02/2018   CREATININE 0.59 06/02/2018   BILITOT 0.5 06/02/2018   ALKPHOS 84 06/02/2018   AST 21 06/02/2018   ALT 12 06/02/2018   PROT 8.0 06/02/2018   ALBUMIN 4.6 06/02/2018   CALCIUM 10.6 (H) 06/02/2018   ANIONGAP 10 06/03/2016   EGFR >90 06/03/2016   GFR 129.54 06/02/2018   Lab Results  Component Value Date   CHOL 231 (H) 06/02/2018   Lab Results  Component Value Date   HDL 68.50 06/02/2018   Lab Results  Component Value Date   LDLCALC 146 (H) 06/02/2018   Lab Results  Component Value Date    TRIG 83.0 06/02/2018   Lab Results  Component Value Date   CHOLHDL 3 06/02/2018   Lab Results  Component Value Date   HGBA1C 5.9 11/06/2017         Assessment & Plan:   Problem List Items Addressed This Visit      Unprioritized   Daytime somnolence    ? Sleep apnea Refer to pulm for sleep eval      Relevant Orders   Ambulatory referral to Pulmonology   Fatigue - Primary    Check labs  ? Sleep apnea      Relevant Orders   TSH (Completed)   Vitamin B12 (Completed)   Vitamin D 1,25 dihydroxy   CBC with Differential/Platelet (Completed)   Comprehensive metabolic panel (Completed)   POCT Urinalysis Dipstick (Automated) (Completed)   Ambulatory referral to Pulmonology   Hyperlipidemia LDL goal <100   Relevant Orders   Lipid panel (Completed)   Comprehensive metabolic panel (Completed)    Other Visit Diagnoses    Chronic pain of right knee       Relevant Orders   Ambulatory referral to Sports Medicine      Pt requesting injection for pain management until she is ready for surgery I have discontinued Shandra Hern's calcium-vitamin D, fluticasone, pravastatin, and cefdinir. I am also having her maintain her MULTIVITAMIN ADULTS 50+.  No orders of the defined types were placed in this encounter.   CMA served as Education administrator  during this visit. History, Physical and Plan performed by medical provider. Documentation and orders reviewed and attested to.  Ann Held, DO

## 2018-06-02 NOTE — Patient Instructions (Signed)

## 2018-06-03 DIAGNOSIS — E785 Hyperlipidemia, unspecified: Secondary | ICD-10-CM | POA: Insufficient documentation

## 2018-06-03 DIAGNOSIS — R5383 Other fatigue: Secondary | ICD-10-CM | POA: Insufficient documentation

## 2018-06-03 DIAGNOSIS — R400 Somnolence: Secondary | ICD-10-CM | POA: Insufficient documentation

## 2018-06-03 LAB — COMPREHENSIVE METABOLIC PANEL
ALT: 12 U/L (ref 0–35)
AST: 21 U/L (ref 0–37)
Albumin: 4.6 g/dL (ref 3.5–5.2)
Alkaline Phosphatase: 84 U/L (ref 39–117)
BUN: 10 mg/dL (ref 6–23)
CO2: 29 mEq/L (ref 19–32)
Calcium: 10.6 mg/dL — ABNORMAL HIGH (ref 8.4–10.5)
Chloride: 102 mEq/L (ref 96–112)
Creatinine, Ser: 0.59 mg/dL (ref 0.40–1.20)
GFR: 129.54 mL/min (ref >60.00)
Glucose, Bld: 86 mg/dL (ref 70–99)
Potassium: 4.8 mEq/L (ref 3.5–5.1)
Sodium: 139 mEq/L (ref 135–145)
Total Bilirubin: 0.5 mg/dL (ref 0.2–1.2)
Total Protein: 8 g/dL (ref 6.0–8.3)

## 2018-06-03 LAB — VITAMIN B12: Vitamin B-12: 792 pg/mL (ref 211–911)

## 2018-06-03 LAB — CBC WITH DIFFERENTIAL/PLATELET
Basophils Absolute: 0 10*3/uL (ref 0.0–0.1)
Basophils Relative: 0.5 % (ref 0.0–3.0)
Eosinophils Absolute: 0 10*3/uL (ref 0.0–0.7)
Eosinophils Relative: 0.6 % (ref 0.0–5.0)
HCT: 42.9 % (ref 36.0–46.0)
Hemoglobin: 13.8 g/dL (ref 12.0–15.0)
Lymphocytes Relative: 23.5 % (ref 12.0–46.0)
Lymphs Abs: 1.7 10*3/uL (ref 0.7–4.0)
MCHC: 32.2 g/dL (ref 30.0–36.0)
MCV: 90.1 fl (ref 78.0–100.0)
Monocytes Absolute: 0.5 10*3/uL (ref 0.1–1.0)
Monocytes Relative: 7.1 % (ref 3.0–12.0)
Neutro Abs: 5.1 10*3/uL (ref 1.4–7.7)
Neutrophils Relative %: 68.3 % (ref 43.0–77.0)
Platelets: 354 10*3/uL (ref 150.0–400.0)
RBC: 4.76 Mil/uL (ref 3.87–5.11)
RDW: 12.5 % (ref 11.5–15.5)
WBC: 7.4 10*3/uL (ref 4.0–10.5)

## 2018-06-03 LAB — LIPID PANEL
Cholesterol: 231 mg/dL — ABNORMAL HIGH (ref 0–200)
HDL: 68.5 mg/dL (ref >39.00)
LDL Cholesterol: 146 mg/dL — ABNORMAL HIGH (ref 0–99)
NonHDL: 162.19
Total CHOL/HDL Ratio: 3
Triglycerides: 83 mg/dL (ref 0.0–149.0)
VLDL: 16.6 mg/dL (ref 0.0–40.0)

## 2018-06-03 LAB — TSH: TSH: 1.91 u[IU]/mL (ref 0.35–4.50)

## 2018-06-03 NOTE — Assessment & Plan Note (Signed)
Check labs  ? Sleep apnea

## 2018-06-03 NOTE — Assessment & Plan Note (Signed)
?   Sleep apnea Refer to pulm for sleep eval

## 2018-06-04 LAB — VITAMIN D 1,25 DIHYDROXY
Vitamin D 1, 25 (OH)2 Total: 40 pg/mL (ref 18–72)
Vitamin D2 1, 25 (OH)2: <8 pg/mL
Vitamin D3 1, 25 (OH)2: 40 pg/mL

## 2018-06-08 ENCOUNTER — Telehealth: Payer: Self-pay | Admitting: Family Medicine

## 2018-06-08 NOTE — Telephone Encounter (Signed)
Copied from [place] [id number]. Topic: Quick Communication - See Telephone Encounter >> Jun 08, 2018  4:44 PM Vernona Rieger wrote: CRM for notification. See Telephone encounter for: 06/08/18.  Patient said she had a call Friday from the office and it said that Dr Charlett Blake would be calling her in a medication? Please advise.

## 2018-06-08 NOTE — Telephone Encounter (Signed)
Routed to [place], to follow-up

## 2018-06-16 NOTE — Telephone Encounter (Signed)
Patient was seen in the office medications discussed

## 2018-06-29 ENCOUNTER — Ambulatory Visit: Payer: BLUE CROSS/BLUE SHIELD | Admitting: Family Medicine

## 2018-07-02 MED ORDER — PRAVASTATIN SODIUM 20 MG PO TABS: 20.0000 mg | ORAL_TABLET | Freq: Every day | ORAL | 3 refills | Status: DC

## 2018-07-02 NOTE — Telephone Encounter (Signed)
Pt is calling back checking on medication that was suppose to be call into her pharm [facility] [address] in [place]

## 2018-07-02 NOTE — Addendum Note (Signed)
Addended byDamita Dunnings D on: 07/02/2018 12:59 PM   Modules accepted: Orders

## 2018-07-02 NOTE — Telephone Encounter (Signed)
Her result note says she needs to start Pravastatin 20 mg po qhs disp #30 with 3 rf. Please send in and let patient know

## 2018-07-02 NOTE — Telephone Encounter (Signed)
LMOM informing Pt that Rx has been sent to [facility].

## 2018-07-02 NOTE — Telephone Encounter (Signed)
Unsure which medication she is talking about. Dr. Charlett Blake- can you assist?

## 2018-07-07 MED ORDER — PRAVASTATIN SODIUM 20 MG PO TABS: 20.0000 mg | ORAL_TABLET | Freq: Every day | ORAL | 3 refills | Status: DC

## 2018-07-07 NOTE — Addendum Note (Signed)
Addended byDamita Dunnings D on: 07/07/2018 11:02 AM   Modules accepted: Orders

## 2018-07-07 NOTE — Telephone Encounter (Signed)
Daughter called and states there was no medication sent in to the pharmacy, if there was the pharmacy states they do not have the medication for the pt; contact to advise

## 2018-07-07 NOTE — Telephone Encounter (Signed)
Rx keeps printing. Will get Rx signed and faxed to [facility].

## 2018-07-08 MED ORDER — PRAVASTATIN SODIUM 20 MG PO TABS: 20.0000 mg | ORAL_TABLET | Freq: Every day | ORAL | 3 refills | Status: DC

## 2018-07-08 NOTE — Telephone Encounter (Signed)
Received fax confirmation

## 2018-07-08 NOTE — Telephone Encounter (Signed)
Melanie Lee, daughter, called stating she just checked with the pharmacy ([facility] on [address]) and they advised they still do not have the RX. She is asking what else can be done to get the RX to her mother. Call back # [phone].

## 2018-07-08 NOTE — Telephone Encounter (Signed)
Fax transmission failed yesterday- I am re-trying.

## 2018-07-08 NOTE — Addendum Note (Signed)
Addended byDamita Dunnings D on: 07/08/2018 02:46 PM   Modules accepted: Orders

## 2018-07-09 ENCOUNTER — Telehealth: Payer: Self-pay | Admitting: *Deleted

## 2018-07-09 NOTE — Telephone Encounter (Signed)
Received Physician Orders from [place].; forwarded to provider/SLS 09/26

## 2018-07-10 ENCOUNTER — Ambulatory Visit: Payer: BLUE CROSS/BLUE SHIELD | Admitting: Family Medicine

## 2018-07-10 ENCOUNTER — Encounter: Payer: Self-pay | Admitting: Family Medicine

## 2018-07-10 DIAGNOSIS — M25561 Pain in right knee: Secondary | ICD-10-CM | POA: Diagnosis not present

## 2018-07-10 DIAGNOSIS — H52222 Regular astigmatism, left eye: Secondary | ICD-10-CM | POA: Diagnosis not present

## 2018-07-10 DIAGNOSIS — H5202 Hypermetropia, left eye: Secondary | ICD-10-CM | POA: Diagnosis not present

## 2018-07-10 DIAGNOSIS — H52221 Regular astigmatism, right eye: Secondary | ICD-10-CM | POA: Diagnosis not present

## 2018-07-10 DIAGNOSIS — H524 Presbyopia: Secondary | ICD-10-CM | POA: Diagnosis not present

## 2018-07-10 DIAGNOSIS — H5211 Myopia, right eye: Secondary | ICD-10-CM | POA: Diagnosis not present

## 2018-07-10 MED ORDER — METHYLPREDNISOLONE ACETATE 40 MG/ML IJ SUSP
40.0000 mg | Freq: Once | INTRAMUSCULAR | Status: AC
  Administered 2018-07-10: 40 mg via INTRA_ARTICULAR

## 2018-07-10 NOTE — Patient Instructions (Signed)

## 2018-07-12 ENCOUNTER — Encounter: Payer: Self-pay | Admitting: Family Medicine

## 2018-07-12 NOTE — Progress Notes (Signed)
PCP: Mosie Lukes, MD Consultation requested by:  Dr. Carollee Herter  Subjective:   HPI: Patient is a 72 y.o. female here for right knee pain.  Patient is s/p left knee replacement and this knee is doing well. She reports several year history of problems with right knee too but doesn't have enough vacation time to have this side replaced at this time. Had injection a few years ago and this helped, would like one today. Pain anterior, associated with swelling, at 8/10 and sharp, worse with walking. No skin changes, numbness.  Past Medical History:  Diagnosis Date  . Elevated blood pressure (not hypertension)    "elevates sometimes with MD visits"  . Frequent headaches    04-19-16 "no longer bothered"- since no cholesterol med   . H/O measles   . 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 Multiple Vitamins-Minerals (MULTIVITAMIN ADULTS 50+) TABS Take 1 tablet by mouth daily.     . pravastatin (PRAVACHOL) 20 MG tablet Take 1 tablet (20 mg total) by mouth at bedtime. 30 tablet 3   No current facility-administered medications on file prior to visit.     Past Surgical History:  Procedure Laterality Date  . ABDOMINAL HYSTERECTOMY  1995   total for fibroids  . BREAST BIOPSY  1995   benign  . CATARACT EXTRACTION  2013 & 2015   Bilateral  . KNEE SURGERY     Left Arthroscopy  . 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 Tramadol Nausea Only and Other (See Comments)    Dizziness    Social History   Socioeconomic History  . Marital status: Single    Spouse name: Not on file  . Number of children: Not on file  . Years of education: Not on file  . Highest education level: Not on file   Occupational History  . Not on file  Social Needs  . Financial resource strain: Not on file  . Food insecurity:    Worry: Not on file    Inability: Not on file  . Transportation needs:    Medical: Not on file    Non-medical: Not on file  Tobacco Use  . Smoking status: Never Smoker  . Smokeless tobacco: Never Used  Substance and Sexual Activity  . Alcohol use: Yes    Alcohol/week: 0.0 standard drinks    Comment: rare  . Drug use: No  . Sexual activity: Not Currently  Lifestyle  . Physical activity:    Days per week: Not on file    Minutes per session: Not on file  . Stress: Not on file  Relationships  . Social connections:    Talks on phone: Not on file    Gets together: Not on file    Attends religious service: Not on file    Active member of club or organization: Not on file    Attends meetings of clubs or organizations: Not on file    Relationship status: Not on file  . Intimate partner violence:    Fear of current or ex partner: Not on file    Emotionally abused: Not on file    Physically abused: Not on file    Forced sexual activity:  Not on file  Other Topics Concern  . 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    Family History  Problem Relation Age of Onset  . Breast cancer Mother 23       Deceased  . Heart disease Mother   . Hypertension Mother   . Hypertension Father        Deceased  . Stroke Father   . Hypertension Sister   . Hypertension Brother   . Heart defect Daughter   . Hyperlipidemia Daughter   . Hypertension Daughter   . Vision loss Paternal Uncle   . Stroke Brother     BP 137/83   Pulse 75   Ht 5\' 2"  (1.575 m)   Wt 157 lb (71.2 kg)   BMI 28.72 kg/m   Review of Systems: See HPI above.     Objective:  Physical Exam:  Gen: NAD, comfortable in exam room  Right knee: Genu valgus.  No other gross deformity, ecchymoses.  Minimal effusion. TTP lateral and medial joint lines. FROM with 5/5 strength  flexion and extension. Negative ant/post drawers. Negative valgus/varus testing. Negative lachmans. Negative mcmurrays, apleys, patellar apprehension. NV intact distally.  Left knee: Well healed TKR scar.  No deformity. FROM with 5/5 strength. No tenderness to palpation. NVI distally.   Assessment & Plan:  1. Right knee pain - 2/2 known severe arthritis, most recent available radiographs from 10/2014.  Intraarticular injection given today.  Discussed tylenol, topical medications, supplements that may help, rare NSAIDs.  Icing, home exercises. Consider viscosupplementation if not improving.  After informed written consent timeout was performed, patient was lying supine on exam table. Right knee was prepped with alcohol swab and utilizing superolateral approach with ultrasound guidance, patient's right knee was injected intraarticularly with 3:1 bupivicaine: depomedrol. Patient tolerated the procedure well without immediate complications.

## 2018-10-26 ENCOUNTER — Other Ambulatory Visit: Payer: Self-pay | Admitting: Family Medicine

## 2018-10-27 ENCOUNTER — Other Ambulatory Visit: Payer: Self-pay

## 2018-10-27 MED ORDER — PRAVASTATIN SODIUM 20 MG PO TABS: 20.0000 mg | ORAL_TABLET | Freq: Every day | ORAL | 2 refills | Status: DC

## 2018-11-09 DIAGNOSIS — Z96652 Presence of left artificial knee joint: Secondary | ICD-10-CM | POA: Diagnosis not present

## 2018-11-09 DIAGNOSIS — R928 Other abnormal and inconclusive findings on diagnostic imaging of breast: Secondary | ICD-10-CM | POA: Diagnosis not present

## 2018-11-09 DIAGNOSIS — Z471 Aftercare following joint replacement surgery: Secondary | ICD-10-CM | POA: Diagnosis not present

## 2018-11-09 DIAGNOSIS — R922 Inconclusive mammogram: Secondary | ICD-10-CM | POA: Diagnosis not present

## 2018-11-09 DIAGNOSIS — M1711 Unilateral primary osteoarthritis, right knee: Secondary | ICD-10-CM | POA: Diagnosis not present

## 2018-11-09 LAB — HM MAMMOGRAPHY

## 2018-11-11 ENCOUNTER — Telehealth: Payer: Self-pay | Admitting: *Deleted

## 2018-11-11 NOTE — Telephone Encounter (Signed)
Received Mammogram results from [facility]; forwarded to provider/SLS 01/29

## 2018-11-12 DIAGNOSIS — Z713 Dietary counseling and surveillance: Secondary | ICD-10-CM | POA: Diagnosis not present

## 2018-11-17 ENCOUNTER — Telehealth: Payer: Self-pay | Admitting: *Deleted

## 2018-11-17 NOTE — Telephone Encounter (Signed)
Received Physician Orders from [place]; forwarded to provider/SLS 02/04

## 2018-11-26 ENCOUNTER — Encounter: Payer: Self-pay | Admitting: Family Medicine

## 2018-12-23 DIAGNOSIS — Z713 Dietary counseling and surveillance: Secondary | ICD-10-CM | POA: Diagnosis not present

## 2018-12-28 ENCOUNTER — Telehealth: Payer: Self-pay | Admitting: *Deleted

## 2018-12-28 NOTE — Telephone Encounter (Signed)
Received Physician Orders from [place]; forwarded to provider/SLS 03/16

## 2019-01-04 ENCOUNTER — Other Ambulatory Visit: Payer: Self-pay | Admitting: Family Medicine

## 2019-01-04 ENCOUNTER — Telehealth: Payer: Self-pay | Admitting: Family Medicine

## 2019-01-04 DIAGNOSIS — M25561 Pain in right knee: Secondary | ICD-10-CM

## 2019-01-04 NOTE — Telephone Encounter (Signed)
OK to consider Cyclobenzaprine but her last med was Robaxin. Clarify which one she wants and if she wants to switch to Cyclobenzaprine 5 mg tabs, use 1 tab po bid prn pain, disp #40, 1 rf and will refer to Dr Barbaraann Barthel

## 2019-01-04 NOTE — Telephone Encounter (Signed)
Copied from [place] [phone]. Topic: General - Other >> Jan 04, 2019  2:34 PM Oneta Rack wrote: Caller name:Trang,Angela Relation to pt: daughter  Call back number:[phone] Pharmacy:   [facility] [address] [place], [place] - [address] [phone] (Phone) [phone] (Fax)    Reason for call:  Daughter requesting referral to Dene Gentry, MD office due to right knee pain. Patient requesting cyclobenzaprine, please advise

## 2019-01-04 NOTE — Telephone Encounter (Signed)
Please advise 

## 2019-01-05 DIAGNOSIS — M170 Bilateral primary osteoarthritis of knee: Secondary | ICD-10-CM | POA: Diagnosis not present

## 2019-01-12 NOTE — Telephone Encounter (Signed)
Called patient unable to leave voicemail  

## 2019-02-22 DIAGNOSIS — M25362 Other instability, left knee: Secondary | ICD-10-CM | POA: Diagnosis not present

## 2019-02-22 DIAGNOSIS — M1711 Unilateral primary osteoarthritis, right knee: Secondary | ICD-10-CM | POA: Diagnosis not present

## 2019-02-22 DIAGNOSIS — Z471 Aftercare following joint replacement surgery: Secondary | ICD-10-CM | POA: Diagnosis not present

## 2019-02-22 DIAGNOSIS — Z96652 Presence of left artificial knee joint: Secondary | ICD-10-CM | POA: Diagnosis not present

## 2019-03-11 DIAGNOSIS — Z713 Dietary counseling and surveillance: Secondary | ICD-10-CM | POA: Diagnosis not present

## 2019-05-17 DIAGNOSIS — M25552 Pain in left hip: Secondary | ICD-10-CM | POA: Diagnosis not present

## 2019-05-17 DIAGNOSIS — R350 Frequency of micturition: Secondary | ICD-10-CM | POA: Diagnosis not present

## 2019-05-17 DIAGNOSIS — M5489 Other dorsalgia: Secondary | ICD-10-CM | POA: Diagnosis not present

## 2019-05-17 DIAGNOSIS — N390 Urinary tract infection, site not specified: Secondary | ICD-10-CM | POA: Diagnosis not present

## 2019-05-20 DIAGNOSIS — M25552 Pain in left hip: Secondary | ICD-10-CM | POA: Diagnosis not present

## 2019-05-20 DIAGNOSIS — Z96652 Presence of left artificial knee joint: Secondary | ICD-10-CM | POA: Diagnosis not present

## 2019-05-20 DIAGNOSIS — Z471 Aftercare following joint replacement surgery: Secondary | ICD-10-CM | POA: Diagnosis not present

## 2019-05-20 DIAGNOSIS — M1711 Unilateral primary osteoarthritis, right knee: Secondary | ICD-10-CM | POA: Diagnosis not present

## 2019-06-08 ENCOUNTER — Other Ambulatory Visit: Payer: Self-pay

## 2019-06-08 ENCOUNTER — Encounter: Payer: Self-pay | Admitting: Family Medicine

## 2019-06-08 ENCOUNTER — Ambulatory Visit (INDEPENDENT_AMBULATORY_CARE_PROVIDER_SITE_OTHER): Payer: BC Managed Care – PPO | Admitting: Family Medicine

## 2019-06-08 VITALS — BP 130/68 | HR 77 | Temp 96.5°F | Resp 18 | Wt 152.6 lb

## 2019-06-08 DIAGNOSIS — Z13820 Encounter for screening for osteoporosis: Secondary | ICD-10-CM

## 2019-06-08 DIAGNOSIS — R350 Frequency of micturition: Secondary | ICD-10-CM

## 2019-06-08 DIAGNOSIS — R5383 Other fatigue: Secondary | ICD-10-CM

## 2019-06-08 DIAGNOSIS — R739 Hyperglycemia, unspecified: Secondary | ICD-10-CM

## 2019-06-08 DIAGNOSIS — D369 Benign neoplasm, unspecified site: Secondary | ICD-10-CM

## 2019-06-08 DIAGNOSIS — E785 Hyperlipidemia, unspecified: Secondary | ICD-10-CM

## 2019-06-08 DIAGNOSIS — Z0000 Encounter for general adult medical examination without abnormal findings: Secondary | ICD-10-CM

## 2019-06-08 DIAGNOSIS — M2550 Pain in unspecified joint: Secondary | ICD-10-CM

## 2019-06-08 DIAGNOSIS — Z0001 Encounter for general adult medical examination with abnormal findings: Secondary | ICD-10-CM | POA: Diagnosis not present

## 2019-06-08 NOTE — Patient Instructions (Addendum)
Pulse oximeter at [employer], [place] etc Blood pressure cuff  Weekly vitals  Bone density at same time or near mammogram. Can be done at [facility] in [place] or here at [facility], call when you need an order  Shingrix is the new shingles shot 2 shots over 2-6 months, check with insurance regarding coverage. If they agree to pay document. Preventive Care 106 Years and Older, Female Preventive care refers to lifestyle choices and visits with your health care provider that can promote health and wellness. This includes:  A yearly physical exam. This is also called an annual well check.  Regular dental and eye exams.  Immunizations.  Screening for certain conditions.  Healthy lifestyle choices, such as diet and exercise. What can I expect for my preventive care visit? Physical exam Your health care provider will check:  Height and weight. These may be used to calculate body mass index (BMI), which is a measurement that tells if you are at a healthy weight.  Heart rate and blood pressure.  Your skin for abnormal spots. Counseling Your health care provider may ask you questions about:  Alcohol, tobacco, and drug use.  Emotional well-being.  Home and relationship well-being.  Sexual activity.  Eating habits.  History of falls.  Memory and ability to understand (cognition).  Work and work Statistician.  Pregnancy and menstrual history. What immunizations do I need?  Influenza (flu) vaccine  This is recommended every year. Tetanus, diphtheria, and pertussis (Tdap) vaccine  You may need a Td booster every 10 years. Varicella (chickenpox) vaccine  You may need this vaccine if you have not already been vaccinated. Zoster (shingles) vaccine  You may need this after age 41. Pneumococcal conjugate (PCV13) vaccine  One dose is recommended after age 21. Pneumococcal polysaccharide (PPSV23) vaccine  One dose is recommended after age 22. Measles, mumps, and rubella (MMR)  vaccine  You may need at least one dose of MMR if you were born in 1957 or later. You may also need a second dose. Meningococcal conjugate (MenACWY) vaccine  You may need this if you have certain conditions. Hepatitis A vaccine  You may need this if you have certain conditions or if you travel or work in places where you may be exposed to hepatitis A. Hepatitis B vaccine  You may need this if you have certain conditions or if you travel or work in places where you may be exposed to hepatitis B. Haemophilus influenzae type b (Hib) vaccine  You may need this if you have certain conditions. You may receive vaccines as individual doses or as more than one vaccine together in one shot (combination vaccines). Talk with your health care provider about the risks and benefits of combination vaccines. What tests do I need? Blood tests  Lipid and cholesterol levels. These may be checked every 5 years, or more frequently depending on your overall health.  Hepatitis C test.  Hepatitis B test. Screening  Lung cancer screening. You may have this screening every year starting at age 70 if you have a 30-pack-year history of smoking and currently smoke or have quit within the past 15 years.  Colorectal cancer screening. All adults should have this screening starting at age 9 and continuing until age 29. Your health care provider may recommend screening at age 36 if you are at increased risk. You will have tests every 1-10 years, depending on your results and the type of screening test.  Diabetes screening. This is done by checking your blood  sugar (glucose) after you have not eaten for a while (fasting). You may have this done every 1-3 years.  Mammogram. This may be done every 1-2 years. Talk with your health care provider about how often you should have regular mammograms.  BRCA-related cancer screening. This may be done if you have a family history of breast, ovarian, tubal, or peritoneal  cancers. Other tests  Sexually transmitted disease (STD) testing.  Bone density scan. This is done to screen for osteoporosis. You may have this done starting at age 6. Follow these instructions at home: Eating and drinking  Eat a diet that includes fresh fruits and vegetables, whole grains, lean protein, and low-fat dairy products. Limit your intake of foods with high amounts of sugar, saturated fats, and salt.  Take vitamin and mineral supplements as recommended by your health care provider.  Do not drink alcohol if your health care provider tells you not to drink.  If you drink alcohol: ? Limit how much you have to 0-1 drink a day. ? Be aware of how much alcohol is in your drink. In the [place] one drink equals one 12 oz bottle of beer (355 mL), one 5 oz glass of wine (148 mL), or one 1 oz glass of hard liquor (44 mL). Lifestyle  Take daily care of your teeth and gums.  Stay active. Exercise for at least 30 minutes on 5 or more days each week.  Do not use any products that contain nicotine or tobacco, such as cigarettes, e-cigarettes, and chewing tobacco. If you need help quitting, ask your health care provider.  If you are sexually active, practice safe sex. Use a condom or other form of protection in order to prevent STIs (sexually transmitted infections).  Talk with your health care provider about taking a low-dose aspirin or statin. What's next?  Go to your health care provider once a year for a well check visit.  Ask your health care provider how often you should have your eyes and teeth checked.  Stay up to date on all vaccines. This information is not intended to replace advice given to you by your health care provider. Make sure you discuss any questions you have with your health care provider. Document Released: 10/27/2015 Document Revised: 09/24/2018 Document Reviewed: 09/24/2018 Elsevier Patient Education  2020 [employer].

## 2019-06-09 LAB — VITAMIN D 25 HYDROXY (VIT D DEFICIENCY, FRACTURES): VITD: 23.08 ng/mL — ABNORMAL LOW (ref 30.00–100.00)

## 2019-06-09 LAB — COMPREHENSIVE METABOLIC PANEL
ALT: 10 U/L (ref 0–35)
AST: 19 U/L (ref 0–37)
Albumin: 4.7 g/dL (ref 3.5–5.2)
Alkaline Phosphatase: 85 U/L (ref 39–117)
BUN: 11 mg/dL (ref 6–23)
CO2: 27 mEq/L (ref 19–32)
Calcium: 9.9 mg/dL (ref 8.4–10.5)
Chloride: 104 mEq/L (ref 96–112)
Creatinine, Ser: 0.57 mg/dL (ref 0.40–1.20)
GFR: 126.46 mL/min (ref >60.00)
Glucose, Bld: 84 mg/dL (ref 70–99)
Potassium: 4.5 mEq/L (ref 3.5–5.1)
Sodium: 141 mEq/L (ref 135–145)
Total Bilirubin: 0.5 mg/dL (ref 0.2–1.2)
Total Protein: 8 g/dL (ref 6.0–8.3)

## 2019-06-09 LAB — LIPID PANEL
Cholesterol: 223 mg/dL — ABNORMAL HIGH (ref 0–200)
HDL: 68.4 mg/dL (ref >39.00)
LDL Cholesterol: 140 mg/dL — ABNORMAL HIGH (ref 0–99)
NonHDL: 154.11
Total CHOL/HDL Ratio: 3
Triglycerides: 69 mg/dL (ref 0.0–149.0)
VLDL: 13.8 mg/dL (ref 0.0–40.0)

## 2019-06-09 LAB — CBC
HCT: 43.1 % (ref 36.0–46.0)
Hemoglobin: 14 g/dL (ref 12.0–15.0)
MCHC: 32.6 g/dL (ref 30.0–36.0)
MCV: 90.7 fl (ref 78.0–100.0)
Platelets: 370 10*3/uL (ref 150.0–400.0)
RBC: 4.75 Mil/uL (ref 3.87–5.11)
RDW: 12.1 % (ref 11.5–15.5)
WBC: 9.9 10*3/uL (ref 4.0–10.5)

## 2019-06-09 LAB — URINALYSIS, ROUTINE W REFLEX MICROSCOPIC
Bilirubin Urine: NEGATIVE
Hgb urine dipstick: NEGATIVE
Ketones, ur: NEGATIVE
Nitrite: NEGATIVE
RBC / HPF: NONE SEEN (ref 0–?)
Specific Gravity, Urine: 1.025 (ref 1.000–1.030)
Total Protein, Urine: NEGATIVE
Urine Glucose: NEGATIVE
Urobilinogen, UA: 0.2 (ref 0.0–1.0)
pH: 5.5 (ref 5.0–8.0)

## 2019-06-09 LAB — HEMOGLOBIN A1C: Hgb A1c MFr Bld: 5.7 % (ref 4.6–6.5)

## 2019-06-09 LAB — TSH: TSH: 2.05 u[IU]/mL (ref 0.35–4.50)

## 2019-06-10 DIAGNOSIS — M25552 Pain in left hip: Secondary | ICD-10-CM | POA: Diagnosis not present

## 2019-06-10 LAB — URINE CULTURE
MICRO NUMBER:: 808770
Result:: NO GROWTH
SPECIMEN QUALITY:: ADEQUATE

## 2019-06-13 NOTE — Assessment & Plan Note (Signed)
hgba1c acceptable, minimize simple carbs. Increase exercise as tolerated.  

## 2019-06-13 NOTE — Assessment & Plan Note (Signed)
Patient encouraged to maintain heart healthy diet, regular exercise, adequate sleep. Consider daily probiotics. Take medications as prescribed. Referred for colonoscopy. Labs ordered and reviewed.

## 2019-06-13 NOTE — Progress Notes (Signed)
Subjective:    Patient ID: Melanie Lee, female    DOB: [birth year], 73 y.o.   MRN: [id number]  No chief complaint on file.   HPI Patient is in today for annual preventative exam and follow up on chronic medical concerns including arthritis, hyperglycemia, hyperlipidemia and more. She feels well today. Notes arthritis continues to be an issue her left hip and her right knee have been bothering her lately and she is following with [facility]. No recent trauma or injury. No radicular symptoms. She is trying to maintain quarantine. She maintains a heart healthy diet but she is not exercising. No recent febrile illness or hospitalizations. She is tolerating her Pravastatin. Is noting some urinary frequency but no dysuria. Denies CP/palp/SOB/HA/congestion/fevers/GI c/o. Taking meds as prescribed  Past Medical History:  Diagnosis Date  . Elevated blood pressure (not hypertension)    "elevates sometimes with MD visits"  . Frequent headaches    04-19-16 "no longer bothered"- since no cholesterol med   . H/O measles   . 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 ABDOMINAL HYSTERECTOMY  1995   total for fibroids  . BREAST BIOPSY  1995   benign  . CATARACT EXTRACTION  2013 & 2015   Bilateral  . KNEE SURGERY     Left Arthroscopy  . 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 Breast cancer Mother 64       Deceased  . Heart disease Mother   . Hypertension Mother   . Hypertension Father        Deceased  . Stroke Father   . Hypertension Sister   . Hypertension Brother   . Heart defect Daughter   . Hyperlipidemia Daughter   . Hypertension Daughter   . Vision loss Paternal Uncle   . Stroke Brother     Social History    Socioeconomic History  . Marital status: Single    Spouse name: Not on file  . Number of children: Not on file  . Years of education: Not on file  . Highest education level: Not on file  Occupational History  . Not on file  Social Needs  . Financial resource strain: Not on file  . Food insecurity    Worry: Not on file    Inability: Not on file  . Transportation needs    Medical: Not on file    Non-medical: Not on file  Tobacco Use  . Smoking status: Never Smoker  . Smokeless tobacco: Never Used  Substance and Sexual Activity  . Alcohol use: Yes    Alcohol/week: 0.0 standard drinks    Comment: rare  . Drug use: No  . Sexual activity: Not Currently  Lifestyle  . Physical activity    Days per week: Not on file    Minutes per session: Not on file  . Stress: Not on file  Relationships  . Social Herbalist on phone: Not on file    Gets together: Not on file    Attends religious service: Not on file    Active member of club or organization: Not on file    Attends meetings of clubs or organizations: Not on file    Relationship  status: Not on file  . Intimate partner violence    Fear of current or ex partner: Not on file    Emotionally abused: Not on file    Physically abused: Not on file    Forced sexual activity: Not on file  Other Topics Concern  . 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    Outpatient Medications Prior to Visit  Medication Sig Dispense Refill  . Multiple Vitamins-Minerals (MULTIVITAMIN ADULTS 50+) TABS Take 1 tablet by mouth daily.     . pravastatin (PRAVACHOL) 20 MG tablet Take 1 tablet (20 mg total) by mouth at bedtime. 90 tablet 2   No facility-administered medications prior to visit.     Allergies  Allergen Reactions  . Tramadol Nausea Only and Other (See Comments)    Dizziness    Review of Systems  Constitutional: Positive for malaise/fatigue. Negative for chills and fever.  HENT: Negative for  congestion and hearing loss.   Eyes: Negative for discharge.  Respiratory: Negative for cough, sputum production and shortness of breath.   Cardiovascular: Negative for chest pain, palpitations and leg swelling.  Gastrointestinal: Negative for abdominal pain, blood in stool, constipation, diarrhea, heartburn, nausea and vomiting.  Genitourinary: Positive for frequency. Negative for dysuria, hematuria and urgency.  Musculoskeletal: Positive for joint pain. Negative for back pain, falls and myalgias.  Skin: Negative for rash.  Neurological: Negative for dizziness, sensory change, loss of consciousness, weakness and headaches.  Endo/Heme/Allergies: Negative for environmental allergies. Does not bruise/bleed easily.  Psychiatric/Behavioral: Negative for depression and suicidal ideas. The patient is not nervous/anxious and does not have insomnia.        Objective:    Physical Exam Constitutional:      General: She is not in acute distress.    Appearance: She is well-developed.  HENT:     Head: Normocephalic and atraumatic.  Eyes:     Conjunctiva/sclera: Conjunctivae normal.  Neck:     Musculoskeletal: Neck supple.     Thyroid: No thyromegaly.  Cardiovascular:     Rate and Rhythm: Normal rate and regular rhythm.     Heart sounds: Normal heart sounds. No murmur.  Pulmonary:     Effort: Pulmonary effort is normal. No respiratory distress.     Breath sounds: Normal breath sounds.  Abdominal:     General: Bowel sounds are normal. There is no distension.     Palpations: Abdomen is soft. There is no mass.     Tenderness: There is no abdominal tenderness.  Lymphadenopathy:     Cervical: No cervical adenopathy.  Skin:    General: Skin is warm and dry.  Neurological:     Mental Status: She is alert and oriented to person, place, and time.  Psychiatric:        Behavior: Behavior normal.     BP 130/68 (BP Location: Left Arm, Patient Position: Sitting, Cuff Size: Normal)   Pulse 77    Temp (!) 96.5 F (35.8 C) (Temporal)   Resp 18   Wt 152 lb 9.6 oz (69.2 kg)   SpO2 97%   BMI 27.91 kg/m  Wt Readings from Last 3 Encounters:  06/08/19 152 lb 9.6 oz (69.2 kg)  07/10/18 157 lb (71.2 kg)  06/02/18 158 lb 6.4 oz (71.8 kg)    Diabetic Foot Exam - Simple   No data filed     Lab Results  Component Value Date   WBC 9.9 06/08/2019   HGB 14.0 06/08/2019  HCT 43.1 06/08/2019   PLT 370.0 06/08/2019   GLUCOSE 84 06/08/2019   CHOL 223 (H) 06/08/2019   TRIG 69.0 06/08/2019   HDL 68.40 06/08/2019   LDLCALC 140 (H) 06/08/2019   ALT 10 06/08/2019   AST 19 06/08/2019   NA 141 06/08/2019   K 4.5 06/08/2019   CL 104 06/08/2019   CREATININE 0.57 06/08/2019   BUN 11 06/08/2019   CO2 27 06/08/2019   TSH 2.05 06/08/2019   INR 1.03 04/19/2016   HGBA1C 5.7 06/08/2019    Lab Results  Component Value Date   TSH 2.05 06/08/2019   Lab Results  Component Value Date   WBC 9.9 06/08/2019   HGB 14.0 06/08/2019   HCT 43.1 06/08/2019   MCV 90.7 06/08/2019   PLT 370.0 06/08/2019   Lab Results  Component Value Date   NA 141 06/08/2019   K 4.5 06/08/2019   CHLORIDE 105 06/03/2016   CO2 27 06/08/2019   GLUCOSE 84 06/08/2019   BUN 11 06/08/2019   CREATININE 0.57 06/08/2019   BILITOT 0.5 06/08/2019   ALKPHOS 85 06/08/2019   AST 19 06/08/2019   ALT 10 06/08/2019   PROT 8.0 06/08/2019   ALBUMIN 4.7 06/08/2019   CALCIUM 9.9 06/08/2019   ANIONGAP 10 06/03/2016   EGFR >90 06/03/2016   GFR 126.46 06/08/2019   Lab Results  Component Value Date   CHOL 223 (H) 06/08/2019   Lab Results  Component Value Date   HDL 68.40 06/08/2019   Lab Results  Component Value Date   LDLCALC 140 (H) 06/08/2019   Lab Results  Component Value Date   TRIG 69.0 06/08/2019   Lab Results  Component Value Date   CHOLHDL 3 06/08/2019   Lab Results  Component Value Date   HGBA1C 5.7 06/08/2019       Assessment & Plan:   Problem List Items Addressed This Visit    Visit for  preventive health examination    Patient encouraged to maintain heart healthy diet, regular exercise, adequate sleep. Consider daily probiotics. Take medications as prescribed. Referred for colonoscopy. Labs ordered and reviewed.       Relevant Orders   TSH (Completed)   Osteoporosis screening   Other fatigue   Relevant Orders   TSH (Completed)   Hyperlipidemia - Primary    Tolerating statin, encouraged heart healthy diet, avoid trans fats, minimize simple carbs and saturated fats. Increase exercise as tolerated      Relevant Orders   Lipid panel (Completed)   TSH (Completed)   Hyperglycemia    hgba1c acceptable, minimize simple carbs. Increase exercise as tolerated.       Relevant Orders   Comprehensive metabolic panel (Completed)   TSH (Completed)   Hemoglobin A1c (Completed)   Arthralgia    Has diffuse arthritis and is noting the most trouble with left hip and right knee pain and dysfunction at this time. Is following with [facility]       Other Visit Diagnoses    Urinary frequency       Relevant Orders   CBC (Completed)   Urinalysis   Urine Culture (Completed)   Hypercalcemia       Relevant Orders   VITAMIN D 25 Hydroxy (Vit-D Deficiency, Fractures) (Completed)   Tubular adenoma       Relevant Orders   Ambulatory referral to Gastroenterology      I am having Joneen Roach maintain her Multivitamin Adults 50+ and pravastatin.  No orders of the  defined types were placed in this encounter.    Penni Homans, MD

## 2019-06-13 NOTE — Assessment & Plan Note (Signed)
Has diffuse arthritis and is noting the most trouble with left hip and right knee pain and dysfunction at this time. Is following with [facility]

## 2019-06-13 NOTE — Assessment & Plan Note (Signed)
Tolerating statin, encouraged heart healthy diet, avoid trans fats, minimize simple carbs and saturated fats. Increase exercise as tolerated 

## 2019-08-13 DIAGNOSIS — Z713 Dietary counseling and surveillance: Secondary | ICD-10-CM | POA: Diagnosis not present

## 2019-08-26 DIAGNOSIS — K635 Polyp of colon: Secondary | ICD-10-CM | POA: Diagnosis not present

## 2019-08-26 DIAGNOSIS — Z1211 Encounter for screening for malignant neoplasm of colon: Secondary | ICD-10-CM | POA: Diagnosis not present

## 2019-08-26 DIAGNOSIS — Z86010 Personal history of colonic polyps: Secondary | ICD-10-CM | POA: Diagnosis not present

## 2019-08-26 DIAGNOSIS — K648 Other hemorrhoids: Secondary | ICD-10-CM | POA: Diagnosis not present

## 2019-08-26 DIAGNOSIS — K5730 Diverticulosis of large intestine without perforation or abscess without bleeding: Secondary | ICD-10-CM | POA: Diagnosis not present

## 2019-08-26 DIAGNOSIS — D122 Benign neoplasm of ascending colon: Secondary | ICD-10-CM | POA: Diagnosis not present

## 2019-09-23 DIAGNOSIS — Z96652 Presence of left artificial knee joint: Secondary | ICD-10-CM | POA: Diagnosis not present

## 2019-09-23 DIAGNOSIS — M1711 Unilateral primary osteoarthritis, right knee: Secondary | ICD-10-CM | POA: Diagnosis not present

## 2019-09-23 DIAGNOSIS — M25552 Pain in left hip: Secondary | ICD-10-CM | POA: Diagnosis not present

## 2019-10-20 DIAGNOSIS — Z20828 Contact with and (suspected) exposure to other viral communicable diseases: Secondary | ICD-10-CM | POA: Diagnosis not present

## 2019-11-11 DIAGNOSIS — Z713 Dietary counseling and surveillance: Secondary | ICD-10-CM | POA: Diagnosis not present

## 2019-11-17 ENCOUNTER — Other Ambulatory Visit: Payer: Self-pay

## 2019-11-18 ENCOUNTER — Other Ambulatory Visit: Payer: Self-pay

## 2019-11-18 ENCOUNTER — Ambulatory Visit: Payer: BC Managed Care – PPO | Admitting: Medical

## 2019-11-18 ENCOUNTER — Ambulatory Visit (HOSPITAL_BASED_OUTPATIENT_CLINIC_OR_DEPARTMENT_OTHER)
Admission: RE | Admit: 2019-11-18 | Discharge: 2019-11-18 | Disposition: A | Discharge status: Home / Self Care | Payer: BC Managed Care – PPO | Source: Ambulatory Visit | Attending: Medical | Admitting: Medical

## 2019-11-18 VITALS — BP 137/71 | HR 93 | Temp 97.1°F | Resp 12 | Ht 64.0 in | Wt 154.8 lb

## 2019-11-18 DIAGNOSIS — G8929 Other chronic pain: Secondary | ICD-10-CM

## 2019-11-18 DIAGNOSIS — M25562 Pain in left knee: Secondary | ICD-10-CM | POA: Insufficient documentation

## 2019-11-18 DIAGNOSIS — M79632 Pain in left forearm: Secondary | ICD-10-CM | POA: Diagnosis not present

## 2019-11-18 DIAGNOSIS — R109 Unspecified abdominal pain: Secondary | ICD-10-CM

## 2019-11-18 DIAGNOSIS — M25532 Pain in left wrist: Secondary | ICD-10-CM

## 2019-11-18 DIAGNOSIS — Z96652 Presence of left artificial knee joint: Secondary | ICD-10-CM | POA: Diagnosis not present

## 2019-11-18 LAB — POC URINALSYSI DIPSTICK (AUTOMATED)
Bilirubin, UA: NEGATIVE
Blood, UA: NEGATIVE
Glucose, UA: NEGATIVE
Ketones, UA: NEGATIVE
Leukocytes, UA: NEGATIVE
Nitrite, UA: NEGATIVE
Protein, UA: NEGATIVE
Spec Grav, UA: 1.02 (ref 1.010–1.025)
Urobilinogen, UA: 0.2 E.U./dL
pH, UA: 6 (ref 5.0–8.0)

## 2019-11-18 MED ORDER — MELOXICAM 7.5 MG PO TABS: 7.5000 mg | ORAL_TABLET | Freq: Every day | ORAL | 0 refills | Status: DC

## 2019-11-18 MED ORDER — CIPROFLOXACIN HCL 250 MG PO TABS: ORAL_TABLET | ORAL | 0 refills | Status: DC

## 2019-11-18 MED ORDER — CIPROFLOXACIN HCL 250 MG PO TABS: 250.0000 mg | ORAL_TABLET | Freq: Three times a day (TID) | ORAL | 0 refills | Status: AC

## 2019-11-18 NOTE — Patient Instructions (Signed)
You have recent flank pain intermittent but none on exam today. Your urine is clear and you feel like you may have urinary tract infection. I will give low dose cipro for 3 days and get culture.  You do have some diverticulosis on prior colonoscopy so if pain returns and worsens with negative culture then would likely get ct abd/pelvis to look at diverticulosis. Also in that event might add flagyl antibitic.  For left forearm pain will get xray. If pain worsens let me know. If you get tender veins, swollen forearm or upper arm then will get left upper extremity US.  For left knee pain will get xray.  For knee pain and forearm pain will rx low dose meloxcam for 7 days.  Follow up in 7 days or as needed

## 2019-11-18 NOTE — Progress Notes (Signed)
   Subjective:    Patient ID: Melanie Lee, female    DOB: [birth year], 74 y.o.   MRN: [id number]  HPI  Pt in with some left side flank area pain for about one week.   She states pain comes and goes. When has pain can hurt for about one hour. No pain on urination. Recent more frequent urination. No fever, no chills or sweats. No constipation or diarrhea.  When has pain is level 8/10. States pain worse when sitting. If she stands pain decreases but doe not go away.  Pt does have history of colon polyps. Study done in 2017.  Pt has left knee pain past 2 weeks. Hx of knee replacement. Told needs hip replacement on left side.  Left forearm area pain for one week. No trauma. No change in veins. No swelling in forearm or upper arm. Pain comes and goes. No neck pain.     Review of Systems  Constitutional: Negative for chills, fatigue and fever.  Respiratory: Negative for apnea, cough, shortness of breath and wheezing.   Cardiovascular: Negative for chest pain and palpitations.  Gastrointestinal: Negative for abdominal pain.  Musculoskeletal: Negative for back pain and myalgias.  Hematological: Negative for adenopathy. Does not bruise/bleed easily.  Psychiatric/Behavioral: Negative for behavioral problems and confusion.       Objective:   Physical Exam   General Mental Status- Alert. General Appearance- Not in acute distress.   Skin General: Color- Normal Color. Moisture- Normal Moisture.  Neck Carotid Arteries- Normal color. Moisture- Normal Moisture. No carotid bruits. No JVD.  Chest and Lung Exam Auscultation: Breath Sounds:-Normal.  Cardiovascular Auscultation:Rythm- Regular. Murmurs & Other Heart Sounds:Auscultation of the heart reveals- No Murmurs.  Abdomen Inspection:-Inspeection Normal. Palpation/Percussion:Note:No mass. Palpation and Percussion of the abdomen reveal- Non Tender, Non Distended + BS, no rebound or guarding.   Neurologic Cranial Nerve exam:-  CN III-XII intact(No nystagmus), symmetric smile. tStrength:- 5/5 equal and symmetric strength both upper and lower extremities.  Left knee- scar from knee replacement. No warmth. Mild swollen at best. On rom no crepitus but has pain.  Left upper ext- no upper arm or forearm swelling. No redness. No warmth. Vein visible but not tender. Normal vein on palpation.(pt states always presnet.)  Skin- on flank. There is no rash seen.     Assessment & Plan:  You have recent flank pain intermittent but none on exam today. Your urine is clear and you feel like you may have urinary tract infection. I will give low dose cipro for 3 days and get culture.  You do have some diverticulosis on prior colonoscopy so if pain returns and worsens with negative culture then would likely get ct abd/pelvis to look at diverticulosis. Also in that event might add flagyl antibitic.  For left forearm pain will get xray. If pain worsens let me know. If you get tender veins, swollen forearm or upper arm then will get left upper extremity US.  For left knee pain will get xray.  For knee pain and forearm pain will rx low dose meloxcam for 7 days.  Follow up in 7 days or as needed  40 mintues spent with pt. 50% of time spent counseling pt on plan going forward.  Mackie Pai, PA-C

## 2019-11-19 LAB — URINE CULTURE
MICRO NUMBER:: 10116655
SPECIMEN QUALITY:: ADEQUATE

## 2019-11-22 ENCOUNTER — Telehealth: Payer: Self-pay

## 2019-11-22 NOTE — Telephone Encounter (Signed)
Patient called in needing some one to give her a call to discuss her X Ray results.   Please call the patient at [phone]

## 2019-11-23 NOTE — Telephone Encounter (Signed)
See result notes. 

## 2019-11-25 ENCOUNTER — Ambulatory Visit: Payer: BC Managed Care – PPO | Admitting: Medical

## 2019-11-26 ENCOUNTER — Ambulatory Visit (HOSPITAL_BASED_OUTPATIENT_CLINIC_OR_DEPARTMENT_OTHER): Payer: BC Managed Care – PPO

## 2019-11-26 ENCOUNTER — Other Ambulatory Visit: Payer: Self-pay

## 2019-11-26 ENCOUNTER — Ambulatory Visit: Payer: BC Managed Care – PPO | Admitting: Medical

## 2019-11-26 VITALS — BP 140/72 | HR 71 | Temp 96.0°F | Resp 12 | Ht 64.0 in | Wt 155.6 lb

## 2019-11-26 DIAGNOSIS — K5792 Diverticulitis of intestine, part unspecified, without perforation or abscess without bleeding: Secondary | ICD-10-CM

## 2019-11-26 DIAGNOSIS — R1012 Left upper quadrant pain: Secondary | ICD-10-CM | POA: Diagnosis not present

## 2019-11-26 DIAGNOSIS — R109 Unspecified abdominal pain: Secondary | ICD-10-CM

## 2019-11-26 LAB — COMPREHENSIVE METABOLIC PANEL
AG Ratio: 1.4 (calc) (ref 1.0–2.5)
ALT: 10 U/L (ref 6–29)
AST: 21 U/L (ref 10–35)
Albumin: 4.6 g/dL (ref 3.6–5.1)
Alkaline phosphatase (APISO): 88 U/L (ref 37–153)
BUN/Creatinine Ratio: 17 (calc) (ref 6–22)
BUN: 10 mg/dL (ref 7–25)
CO2: 27 mmol/L (ref 20–32)
Calcium: 9.9 mg/dL (ref 8.6–10.4)
Chloride: 103 mmol/L (ref 98–110)
Creat: 0.58 mg/dL — ABNORMAL LOW (ref 0.60–0.93)
Globulin: 3.4 g/dL (calc) (ref 1.9–3.7)
Glucose, Bld: 79 mg/dL (ref 65–99)
Potassium: 4.5 mmol/L (ref 3.5–5.3)
Sodium: 140 mmol/L (ref 135–146)
Total Bilirubin: 0.5 mg/dL (ref 0.2–1.2)
Total Protein: 8 g/dL (ref 6.1–8.1)

## 2019-11-26 LAB — CBC WITH DIFFERENTIAL/PLATELET
Absolute Monocytes: 684 cells/uL (ref 200–950)
Basophils Absolute: 54 cells/uL (ref 0–200)
Basophils Relative: 0.6 %
Eosinophils Absolute: 18 cells/uL (ref 15–500)
Eosinophils Relative: 0.2 %
HCT: 41.3 % (ref 35.0–45.0)
Hemoglobin: 13.8 g/dL (ref 11.7–15.5)
Lymphs Abs: 1962 cells/uL (ref 850–3900)
MCH: 29.5 pg (ref 27.0–33.0)
MCHC: 33.4 g/dL (ref 32.0–36.0)
MCV: 88.2 fL (ref 80.0–100.0)
MPV: 9.1 fL (ref 7.5–12.5)
Monocytes Relative: 7.6 %
Neutro Abs: 6282 cells/uL (ref 1500–7800)
Neutrophils Relative %: 69.8 %
Platelets: 366 10*3/uL (ref 140–400)
RBC: 4.68 10*6/uL (ref 3.80–5.10)
RDW: 11.7 % (ref 11.0–15.0)
Total Lymphocyte: 21.8 %
WBC: 9 10*3/uL (ref 3.8–10.8)

## 2019-11-26 LAB — POC URINALSYSI DIPSTICK (AUTOMATED)
Bilirubin, UA: NEGATIVE
Blood, UA: NEGATIVE
Glucose, UA: NEGATIVE
Ketones, UA: NEGATIVE
Leukocytes, UA: NEGATIVE
Nitrite, UA: NEGATIVE
Protein, UA: NEGATIVE
Spec Grav, UA: 1.01 (ref 1.010–1.025)
Urobilinogen, UA: 0.2 E.U./dL
pH, UA: 6 (ref 5.0–8.0)

## 2019-11-26 MED ORDER — CIPROFLOXACIN HCL 500 MG PO TABS: 500.0000 mg | ORAL_TABLET | Freq: Two times a day (BID) | ORAL | 0 refills | Status: DC

## 2019-11-26 MED ORDER — METRONIDAZOLE 500 MG PO TABS: 500.0000 mg | ORAL_TABLET | Freq: Three times a day (TID) | ORAL | 0 refills | Status: DC

## 2019-11-26 NOTE — Progress Notes (Signed)
Subjective:    Patient ID: Melanie Lee, female    DOB: 15-Jan-1946, 74 y.o.   MRN: [id number]  HPI   Pt in with some persisting left upper side abd/flank pain.   hpi recently   Pt in with some left side flank area pain for about one week.   She states pain comes and goes. When has pain can hurt for about one hour.   When has pain is level 8/10 at times on last visit but now 5/10. States pain worse when sitting. If she stands pain decreases but doe not go away.  Pt does have history of colon polyps. Study done in 2017.  Pt forearm pain left side now resolved.   Regarding flank pain, I got urine culture and was negative. Pt signs/symptoms did not resolve with cipro. Pt expresses concern for kidney infection/uti.   Pt has hx of some diverticulosis on prior colonoscopy so if pain persisted and negative culture then thoughtt ct abd/pelvis to look at diverticulosis. Also in that event might add flagyl antibitic.   Review of Systems  Constitutional: Negative for chills, fatigue and fever.  HENT: Negative for congestion, drooling and ear pain.   Respiratory: Negative for cough, chest tightness, shortness of breath and wheezing.   Cardiovascular: Negative for chest pain and palpitations.  Gastrointestinal: Positive for abdominal pain. Negative for constipation.       More left side flank pain.  Musculoskeletal: Positive for back pain.       Left flank pain.  Skin: Negative for rash.  Neurological: Negative for dizziness, speech difficulty, weakness and headaches.  Hematological: Negative for adenopathy. Does not bruise/bleed easily.  Psychiatric/Behavioral: Negative for behavioral problems and confusion.    Past Medical History:  Diagnosis Date  . Elevated blood pressure (not hypertension)    "elevates sometimes with MD visits"  . Frequent headaches    04-19-16 "no longer bothered"- since no cholesterol med   . H/O measles   . 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 Marital status: Single    Spouse name: Not on file  . Number of children: Not on file  . Years of education: Not on file  . Highest education level: Not on file  Occupational History  . Not on file  Tobacco Use  . Smoking status: Never Smoker  . Smokeless tobacco: Never Used  Substance and Sexual Activity  . Alcohol use: Yes    Alcohol/week: 0.0 standard drinks    Comment: rare  . Drug use: No  . Sexual activity: Not Currently  Other Topics Concern  . 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   Social Determinants of Health   Financial Resource Strain:   . Difficulty of Paying Living Expenses: Not on file  Food Insecurity:   . Worried About Charity fundraiser in the Last Year: Not on file  . Ran Out of Food in the Last Year: Not on file  Transportation Needs:   . Lack of Transportation (Medical): Not on file  . Lack of Transportation (Non-Medical): Not on file  Physical Activity:   . Days of Exercise per Week: Not on file  . Minutes of Exercise per Session: Not on file  Stress:   . Feeling of Stress : Not on file  Social Connections:   . Frequency of Communication with Friends and  Family: Not on file  . Frequency of Social Gatherings with Friends and Family: Not on file  . Attends Religious Services: Not on file  . Active Member of Clubs or Organizations: Not on file  . Attends Archivist Meetings: Not on file  . Marital Status: Not on file  Intimate Partner Violence:   . Fear of Current or Ex-Partner: Not on file  . Emotionally Abused: Not on file  . Physically Abused: Not on file  . Sexually Abused: Not on file    Past Surgical History:  Procedure Laterality Date  . ABDOMINAL HYSTERECTOMY  1995   total for fibroids  . BREAST BIOPSY  1995   benign  . CATARACT EXTRACTION  2013 & 2015   Bilateral  . KNEE SURGERY     Left Arthroscopy  .  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 Breast cancer Mother 74       Deceased  . Heart disease Mother   . Hypertension Mother   . Hypertension Father        Deceased  . Stroke Father   . Hypertension Sister   . Hypertension Brother   . Heart defect Daughter   . Hyperlipidemia Daughter   . Hypertension Daughter   . Vision loss Paternal Uncle   . Stroke Brother     Allergies  Allergen Reactions  . Tramadol Nausea Only and Other (See Comments)    Dizziness    Current Outpatient Medications on File Prior to Visit  Medication Sig Dispense Refill  . Multiple Vitamins-Minerals (MULTIVITAMIN ADULTS 50+) TABS Take 1 tablet by mouth daily.     . pravastatin (PRAVACHOL) 20 MG tablet Take 1 tablet (20 mg total) by mouth at bedtime. 90 tablet 2   No current facility-administered medications on file prior to visit.    BP 140/72 (BP Location: Right Arm, Cuff Size: Normal)   Pulse 71   Temp (!) 96 F (35.6 C) (Temporal)   Resp 12   Ht 5\' 4"  (1.626 m)   Wt 155 lb 9.6 oz (70.6 kg)   SpO2 99%   BMI 26.71 kg/m       Objective:   Physical Exam  General- No acute distress. Pleasant patient. Neck- Full range of motion, no jvd Lungs- Clear, even and unlabored. Heart- regular rate and rhythm. Neurologic- CNII- XII grossly intact. Abdomen soft, + bs, ,mild luq tendernss and  left side flank pain.(leve of kidney) Back-faint cva region tender. Lateral aspect of cva region. Left hip- no pain on palpation or rom      Assessment & Plan:  For you left flank area pain that persists despite tx for possible uti/kidney infection will now order ct abd/pelvis to see if pain related to diverticulitis as known diverticulosis on left side.  Will get cbc and cmp today.  Ct today today or on Monday.   If pain  increases over the weekend start cipro and flagyl.   If ct negative consider msk pain but this would be less likely. Doubt stone since no blood present in urine last visit or today.  Follow up 7-10 days or as needed  30 minutes spent with pt.  Mackie Pai, PA-C

## 2019-11-26 NOTE — Addendum Note (Signed)
Addended by: Kem Boroughs D on: 11/26/2019 03:09 PM   Modules accepted: Orders

## 2019-11-26 NOTE — Patient Instructions (Addendum)
For you left flank area pain that persists despite tx for possible uti/kidney infection will now order ct abd/pelvis to see if pain related to diverticulitis as known diverticulosis on left side.  Will get cbc and cmp today.  Ct today today or on Monday.   If pain increases over the weekend start cipro and flagyl.   If ct negative consider msk pain but this would be less likely. Doubt stone since no blood present in urine last visit or today.  Follow up 7-10 days or as needed

## 2019-11-27 ENCOUNTER — Encounter (HOSPITAL_BASED_OUTPATIENT_CLINIC_OR_DEPARTMENT_OTHER): Payer: Self-pay

## 2019-11-27 ENCOUNTER — Telehealth: Payer: Self-pay | Admitting: Medical

## 2019-11-27 ENCOUNTER — Ambulatory Visit (HOSPITAL_BASED_OUTPATIENT_CLINIC_OR_DEPARTMENT_OTHER)
Admission: RE | Admit: 2019-11-27 | Discharge: 2019-11-27 | Disposition: A | Discharge status: Home / Self Care | Payer: BC Managed Care – PPO | Source: Ambulatory Visit | Attending: Medical | Admitting: Medical

## 2019-11-27 DIAGNOSIS — K5792 Diverticulitis of intestine, part unspecified, without perforation or abscess without bleeding: Secondary | ICD-10-CM | POA: Diagnosis not present

## 2019-11-27 DIAGNOSIS — R109 Unspecified abdominal pain: Secondary | ICD-10-CM | POA: Diagnosis not present

## 2019-11-27 DIAGNOSIS — R1012 Left upper quadrant pain: Secondary | ICD-10-CM | POA: Insufficient documentation

## 2019-11-27 DIAGNOSIS — M1612 Unilateral primary osteoarthritis, left hip: Secondary | ICD-10-CM

## 2019-11-27 MED ORDER — IOHEXOL 300 MG/ML  SOLN
100.0000 mL | Freq: Once | INTRAMUSCULAR | Status: AC | PRN
  Administered 2019-11-27: 100 mL via INTRAVENOUS

## 2019-11-27 NOTE — Telephone Encounter (Signed)
Referral to orthopedist placed. 

## 2019-11-29 ENCOUNTER — Ambulatory Visit (HOSPITAL_BASED_OUTPATIENT_CLINIC_OR_DEPARTMENT_OTHER): Payer: BC Managed Care – PPO

## 2019-11-29 ENCOUNTER — Ambulatory Visit: Payer: BC Managed Care – PPO | Admitting: Medical

## 2019-12-01 ENCOUNTER — Encounter: Payer: Self-pay | Admitting: *Deleted

## 2019-12-01 ENCOUNTER — Telehealth: Payer: Self-pay | Admitting: *Deleted

## 2019-12-01 NOTE — Telephone Encounter (Signed)
Patient notified of lab and ct results.  She has an appt with her orthopedics on Tuesday, so no need for our referral.

## 2019-12-07 DIAGNOSIS — M1711 Unilateral primary osteoarthritis, right knee: Secondary | ICD-10-CM | POA: Diagnosis not present

## 2019-12-07 DIAGNOSIS — M1612 Unilateral primary osteoarthritis, left hip: Secondary | ICD-10-CM | POA: Diagnosis not present

## 2019-12-07 DIAGNOSIS — M25562 Pain in left knee: Secondary | ICD-10-CM | POA: Diagnosis not present

## 2019-12-09 ENCOUNTER — Ambulatory Visit: Payer: BC Managed Care – PPO | Admitting: Family Medicine

## 2019-12-31 DIAGNOSIS — M1612 Unilateral primary osteoarthritis, left hip: Secondary | ICD-10-CM | POA: Diagnosis not present

## 2019-12-31 DIAGNOSIS — M1711 Unilateral primary osteoarthritis, right knee: Secondary | ICD-10-CM | POA: Diagnosis not present

## 2020-01-14 DIAGNOSIS — M1711 Unilateral primary osteoarthritis, right knee: Secondary | ICD-10-CM | POA: Diagnosis not present

## 2020-01-14 DIAGNOSIS — M1612 Unilateral primary osteoarthritis, left hip: Secondary | ICD-10-CM | POA: Diagnosis not present

## 2020-02-22 DIAGNOSIS — Z713 Dietary counseling and surveillance: Secondary | ICD-10-CM | POA: Diagnosis not present

## 2020-02-25 ENCOUNTER — Encounter: Payer: Self-pay | Admitting: Family Medicine

## 2020-02-25 ENCOUNTER — Other Ambulatory Visit: Payer: Self-pay

## 2020-02-25 ENCOUNTER — Ambulatory Visit: Payer: BC Managed Care – PPO | Admitting: Family Medicine

## 2020-02-25 VITALS — BP 140/70 | HR 78 | Temp 97.7°F | Resp 18 | Ht 64.0 in | Wt 145.0 lb

## 2020-02-25 DIAGNOSIS — R350 Frequency of micturition: Secondary | ICD-10-CM

## 2020-02-25 DIAGNOSIS — R1010 Upper abdominal pain, unspecified: Secondary | ICD-10-CM | POA: Diagnosis not present

## 2020-02-25 DIAGNOSIS — M25552 Pain in left hip: Secondary | ICD-10-CM | POA: Diagnosis not present

## 2020-02-25 DIAGNOSIS — G8929 Other chronic pain: Secondary | ICD-10-CM | POA: Diagnosis not present

## 2020-02-25 DIAGNOSIS — M25561 Pain in right knee: Secondary | ICD-10-CM | POA: Diagnosis not present

## 2020-02-25 DIAGNOSIS — R319 Hematuria, unspecified: Secondary | ICD-10-CM

## 2020-02-25 DIAGNOSIS — R634 Abnormal weight loss: Secondary | ICD-10-CM | POA: Diagnosis not present

## 2020-02-25 DIAGNOSIS — M1612 Unilateral primary osteoarthritis, left hip: Secondary | ICD-10-CM | POA: Diagnosis not present

## 2020-02-25 LAB — CBC WITH DIFFERENTIAL/PLATELET
Basophils Absolute: 0 10*3/uL (ref 0.0–0.1)
Basophils Relative: 0.5 % (ref 0.0–3.0)
Eosinophils Absolute: 0 10*3/uL (ref 0.0–0.7)
Eosinophils Relative: 0.1 % (ref 0.0–5.0)
HCT: 40.3 % (ref 36.0–46.0)
Hemoglobin: 13.3 g/dL (ref 12.0–15.0)
Lymphocytes Relative: 18.3 % (ref 12.0–46.0)
Lymphs Abs: 1.6 10*3/uL (ref 0.7–4.0)
MCHC: 33.1 g/dL (ref 30.0–36.0)
MCV: 89.6 fl (ref 78.0–100.0)
Monocytes Absolute: 0.7 10*3/uL (ref 0.1–1.0)
Monocytes Relative: 8.5 % (ref 3.0–12.0)
Neutro Abs: 6.3 10*3/uL (ref 1.4–7.7)
Neutrophils Relative %: 72.6 % (ref 43.0–77.0)
Platelets: 377 10*3/uL (ref 150.0–400.0)
RBC: 4.5 Mil/uL (ref 3.87–5.11)
RDW: 12.7 % (ref 11.5–15.5)
WBC: 8.6 10*3/uL (ref 4.0–10.5)

## 2020-02-25 LAB — POC URINALSYSI DIPSTICK (AUTOMATED)
Bilirubin, UA: NEGATIVE
Glucose, UA: NEGATIVE
Leukocytes, UA: NEGATIVE
Nitrite, UA: NEGATIVE
Protein, UA: POSITIVE — AB
Spec Grav, UA: >=1.03 — AB (ref 1.010–1.025)
Urobilinogen, UA: 0.2 E.U./dL
pH, UA: 5 (ref 5.0–8.0)

## 2020-02-25 LAB — COMPREHENSIVE METABOLIC PANEL
ALT: 10 U/L (ref 0–35)
AST: 23 U/L (ref 0–37)
Albumin: 4.5 g/dL (ref 3.5–5.2)
Alkaline Phosphatase: 85 U/L (ref 39–117)
BUN: 11 mg/dL (ref 6–23)
CO2: 29 mEq/L (ref 19–32)
Calcium: 9.5 mg/dL (ref 8.4–10.5)
Chloride: 104 mEq/L (ref 96–112)
Creatinine, Ser: 0.51 mg/dL (ref 0.40–1.20)
GFR: 143.49 mL/min (ref >60.00)
Glucose, Bld: 73 mg/dL (ref 70–99)
Potassium: 4.1 mEq/L (ref 3.5–5.1)
Sodium: 140 mEq/L (ref 135–145)
Total Bilirubin: 0.6 mg/dL (ref 0.2–1.2)
Total Protein: 7.4 g/dL (ref 6.0–8.3)

## 2020-02-25 LAB — TSH: TSH: 1.13 u[IU]/mL (ref 0.35–4.50)

## 2020-02-25 LAB — VITAMIN D 25 HYDROXY (VIT D DEFICIENCY, FRACTURES): VITD: 28 ng/mL — ABNORMAL LOW (ref 30.00–100.00)

## 2020-02-25 LAB — VITAMIN B12: Vitamin B-12: 835 pg/mL (ref 211–911)

## 2020-02-25 NOTE — Progress Notes (Signed)
Patient ID: Melanie Lee, female    DOB: 11-27-1945  Age: 74 y.o. MRN: [id number]    Subjective:  Subjective  HPI Triona Severin c/o weight loss and urinary frequency.    She admits to eating very little   No and pain, no NV   Review of Systems  Constitutional: Positive for unexpected weight change. Negative for appetite change, diaphoresis, fatigue and fever.  HENT: Negative for congestion.   Eyes: Negative for pain, redness and visual disturbance.  Respiratory: Negative for cough, chest tightness, shortness of breath and wheezing.   Cardiovascular: Negative for chest pain, palpitations and leg swelling.  Gastrointestinal: Negative for abdominal pain, blood in stool and nausea.  Endocrine: Negative for cold intolerance, heat intolerance, polydipsia, polyphagia and polyuria.  Genitourinary: Negative for difficulty urinating, dysuria and frequency.  Skin: Negative for rash.  Allergic/Immunologic: Negative for environmental allergies.  Neurological: Negative for dizziness, light-headedness, numbness and headaches.  Psychiatric/Behavioral: The patient is not nervous/anxious.     History Past Medical History:  Diagnosis Date  . Elevated blood pressure (not hypertension)    "elevates sometimes with MD visits"  . Frequent headaches    04-19-16 "no longer bothered"- since no cholesterol med   . H/O measles   . Osteoarthritis (arthritis due to wear and tear of joints)    Bilateral knees    She has a past surgical history that includes Knee surgery; Cataract extraction (2013 & 2015); Total knee arthroplasty (Left, 04/23/2016); Abdominal hysterectomy (1995); and Breast biopsy (1995).   Her family history includes Breast cancer (age of onset: 30) in her mother; Heart defect in her daughter; Heart disease in her mother; Hyperlipidemia in her daughter; Hypertension in her brother, daughter, father, mother, and sister; Stroke in her brother and father; Vision loss in her paternal uncle.She  reports that she has never smoked. She has never used smokeless tobacco. She reports current alcohol use. She reports that she does not use drugs.  Current Outpatient Medications on File Prior to Visit  Medication Sig Dispense Refill  . Multiple Vitamins-Minerals (MULTIVITAMIN ADULTS 50+) TABS Take 1 tablet by mouth daily.     . pravastatin (PRAVACHOL) 20 MG tablet Take 1 tablet (20 mg total) by mouth at bedtime. 90 tablet 2   No current facility-administered medications on file prior to visit.     Objective:  Objective  Physical Exam Constitutional:      Appearance: She is well-developed.  HENT:     Head: Normocephalic and atraumatic.  Eyes:     Conjunctiva/sclera: Conjunctivae normal.  Neck:     Thyroid: No thyromegaly.     Vascular: No carotid bruit or JVD.  Cardiovascular:     Rate and Rhythm: Normal rate and regular rhythm.     Heart sounds: Normal heart sounds. No murmur.  Pulmonary:     Effort: Pulmonary effort is normal. No respiratory distress.     Breath sounds: Normal breath sounds. No wheezing or rales.  Chest:     Chest wall: No tenderness.  Abdominal:     General: There is no distension.     Tenderness: There is no abdominal tenderness. There is no guarding or rebound.  Musculoskeletal:     Cervical back: Normal range of motion and neck supple.  Neurological:     Mental Status: She is alert and oriented to person, place, and time.    BP 140/70 (BP Location: Right Arm, Patient Position: Sitting, Cuff Size: Normal)   Pulse 78   Temp 97.7 F (  36.5 C) (Temporal)   Resp 18   Ht 5\' 4"  (1.626 m)   Wt 145 lb (65.8 kg)   SpO2 96%   BMI 24.89 kg/m  Wt Readings from Last 3 Encounters:  02/25/20 145 lb (65.8 kg)  11/26/19 155 lb 9.6 oz (70.6 kg)  11/18/19 154 lb 12.8 oz (70.2 kg)     Lab Results  Component Value Date   WBC 8.6 02/25/2020   HGB 13.3 02/25/2020   HCT 40.3 02/25/2020   PLT 377.0 02/25/2020   GLUCOSE 73 02/25/2020   CHOL 223 (H) 06/08/2019    TRIG 69.0 06/08/2019   HDL 68.40 06/08/2019   LDLCALC 140 (H) 06/08/2019   ALT 10 02/25/2020   AST 23 02/25/2020   NA 140 02/25/2020   K 4.1 02/25/2020   CL 104 02/25/2020   CREATININE 0.51 02/25/2020   BUN 11 02/25/2020   CO2 29 02/25/2020   TSH 1.13 02/25/2020   INR 1.03 04/19/2016   HGBA1C 5.7 06/08/2019    CT ABDOMEN PELVIS W CONTRAST  Result Date: 11/27/2019 CLINICAL DATA:  Left-sided abdominal pain. Evaluate for diverticulitis. EXAM: CT ABDOMEN AND PELVIS WITH CONTRAST TECHNIQUE: Multidetector CT imaging of the abdomen and pelvis was performed using the standard protocol following bolus administration of intravenous contrast. CONTRAST:  133mL OMNIPAQUE IOHEXOL 300 MG/ML  SOLN COMPARISON:  02/11/2006 FINDINGS: Lower chest: Limited visualization of the lower thorax demonstrates minimal subsegmental atelectasis involving the medial basilar aspect of the image right lower lobe (image 8, series 4). There is a punctate (approximately 0.5 cm) noncalcified nodule within the image right lower lobe (image 8, series 4). Minimal dependent subpleural ground-glass atelectasis within the imaged left lower lobe. No pleural effusion. Normal heart size.  No pericardial effusion. Hepatobiliary: Normal hepatic contour. No discrete hepatic lesions. Normal appearance of the gallbladder given degree distention. No radiopaque gallstones. No intra or extrahepatic biliary ductal dilatation. No ascites. Pancreas: Normal appearance of the pancreas. Spleen: Normal appearance of the spleen. Adrenals/Urinary Tract: There is symmetric enhancement and excretion of the bilateral kidneys. No definite renal stones on this postcontrast examination. No discrete renal lesions. No urinary obstruction or perinephric stranding. Normal appearance the bilateral adrenal glands. Normal appearance of the urinary bladder given degree distention. Stomach/Bowel: Ingested enteric contrast extends to the level of the rectum. No evidence of  enteric obstruction. Normal appearance of the terminal ileum and appendix. No discrete areas of bowel wall thickening. No pneumoperitoneum, pneumatosis or portal venous gas. Vascular/Lymphatic: Normal caliber of the abdominal aorta. The major branch vessels of the abdominal aorta appear patent on this non CTA examination. No bulky retroperitoneal, mesenteric, pelvic or inguinal lymphadenopathy. Reproductive: Post hysterectomy.  No discrete adnexal lesions. Other: There is an indeterminate approximately 8.5 x 4.6 x 3.3 cm fluid collection within the left iliacus musculature (coronal image 49, series 5; axial image 63, series 2). No associated adjacent stranding. This fluid collection does not definitively communicate with the adjacent left hip joint. Tiny mesenteric fat containing periumbilical hernia. Musculoskeletal: No acute or aggressive osseous abnormalities. 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 No evidence of avascular necrosis. Moderate to severe multilevel lumbar spine DDD, worse at L4-L5 with disc space height loss, endplate irregularity and sclerosis. Stigmata of dish within the thoracic spine. IMPRESSION: 1. Indeterminate approximately 8.5 cm fluid collection within the left iliacus musculature without associated adjacent inflammatory change. 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 2. Otherwise, no explanation for patient's left-sided abdominal pain. Specifically, no evidence of enteric or urinary obstruction. No evidence of diverticulitis or appendicitis. 3. Incidentally noted punctate (5 mm) noncalcified right lower lobe pulmonary nodule. No follow-up needed if patient is low-risk. Non-contrast chest CT can be considered  in 12 months if patient is high-risk. This recommendation follows the consensus statement: Guidelines for Management of Incidental Pulmonary Nodules Detected on CT Images: From the Fleischner Society 2017; Radiology 2017; 284:228-243. Electronically Signed   By: Sandi Mariscal M.D.   On: 11/27/2019 11:03     Assessment & Plan:  Plan  I have discontinued Luretta Brum's ciprofloxacin and metroNIDAZOLE. I am also having her maintain her Multivitamin Adults 50+ and pravastatin.  No orders of the defined types were placed in this encounter.   Problem List Items Addressed This Visit    None    Visit Diagnoses    Urinary frequency    -  Primary   Relevant Orders   POCT Urinalysis Dipstick (Automated) (Completed)   Urine Culture (Completed)   Weight loss       Relevant Orders   Ambulatory referral to Gastroenterology   CBC with Differential/Platelet (Completed)   Comprehensive metabolic panel (Completed)   Vitamin B12 (Completed)   TSH (Completed)   Vitamin D (25 hydroxy) (Completed)   Pain of upper abdomen       Relevant Orders   CBC with Differential/Platelet (Completed)   Comprehensive metabolic panel (Completed)   Vitamin B12 (Completed)   TSH (Completed)   Vitamin D (25 hydroxy) (Completed)   Hematuria, unspecified type       Relevant Orders   CBC with Differential/Platelet (Completed)   Comprehensive metabolic panel (Completed)   Vitamin B12 (Completed)   TSH (Completed)   Vitamin D (25 hydroxy) (Completed)    pt wil add ensure or boost and eat 3 meals a day  Follow-up: Return in about 3 weeks (around 03/17/2020), or if symptoms worsen or fail to improve, for f/u weight loss.  Ann Held, DO

## 2020-02-25 NOTE — Patient Instructions (Signed)

## 2020-02-26 LAB — URINE CULTURE
MICRO NUMBER:: 10479147
Result:: NO GROWTH
SPECIMEN QUALITY:: ADEQUATE

## 2020-02-27 ENCOUNTER — Other Ambulatory Visit: Payer: Self-pay | Admitting: Family Medicine

## 2020-02-27 DIAGNOSIS — E559 Vitamin D deficiency, unspecified: Secondary | ICD-10-CM

## 2020-02-27 MED ORDER — VITAMIN D (ERGOCALCIFEROL) 1.25 MG (50000 UNIT) PO CAPS: 50000.0000 [IU] | ORAL_CAPSULE | ORAL | 1 refills | Status: DC

## 2020-03-20 ENCOUNTER — Ambulatory Visit: Payer: BC Managed Care – PPO | Admitting: Family Medicine

## 2020-03-24 ENCOUNTER — Encounter: Payer: Self-pay | Admitting: Family Medicine

## 2020-03-24 ENCOUNTER — Ambulatory Visit: Payer: BC Managed Care – PPO | Admitting: Family Medicine

## 2020-03-24 ENCOUNTER — Other Ambulatory Visit: Payer: Self-pay

## 2020-03-24 VITALS — BP 136/78 | HR 84 | Temp 97.8°F | Resp 18 | Ht 64.0 in | Wt 151.0 lb

## 2020-03-24 DIAGNOSIS — R634 Abnormal weight loss: Secondary | ICD-10-CM | POA: Insufficient documentation

## 2020-03-24 DIAGNOSIS — Z1231 Encounter for screening mammogram for malignant neoplasm of breast: Secondary | ICD-10-CM | POA: Diagnosis not present

## 2020-03-24 DIAGNOSIS — E559 Vitamin D deficiency, unspecified: Secondary | ICD-10-CM | POA: Diagnosis not present

## 2020-03-24 DIAGNOSIS — E785 Hyperlipidemia, unspecified: Secondary | ICD-10-CM

## 2020-03-24 DIAGNOSIS — E1169 Type 2 diabetes mellitus with other specified complication: Secondary | ICD-10-CM

## 2020-03-24 LAB — LIPID PANEL
Cholesterol: 205 mg/dL — ABNORMAL HIGH (ref 0–200)
HDL: 61 mg/dL (ref >39.00)
LDL Cholesterol: 126 mg/dL — ABNORMAL HIGH (ref 0–99)
NonHDL: 144.17
Total CHOL/HDL Ratio: 3
Triglycerides: 92 mg/dL (ref 0.0–149.0)
VLDL: 18.4 mg/dL (ref 0.0–40.0)

## 2020-03-24 NOTE — Progress Notes (Signed)
Patient ID: Melanie Lee, female    DOB: 1946/03/10  Age: 74 y.o. MRN: [id number]    Subjective:  Subjective  HPI Melanie Lee presents for weight check.  She has been doing better with eating 3x a day She has gained 6 lbs   No other complaints   Review of Systems  Constitutional: Negative for activity change, appetite change, diaphoresis, fatigue and unexpected weight change.  Eyes: Negative for pain, redness and visual disturbance.  Respiratory: Negative for cough, chest tightness, shortness of breath and wheezing.   Cardiovascular: Negative for chest pain, palpitations and leg swelling.  Endocrine: Negative for cold intolerance, heat intolerance, polydipsia, polyphagia and polyuria.  Genitourinary: Negative for difficulty urinating, dysuria and frequency.  Neurological: Negative for dizziness, light-headedness, numbness and headaches.  Psychiatric/Behavioral: Negative for behavioral problems and dysphoric mood. The patient is not nervous/anxious.     History Past Medical History:  Diagnosis Date  . Elevated blood pressure (not hypertension)    "elevates sometimes with MD visits"  . Frequent headaches    04-19-16 "no longer bothered"- since no cholesterol med   . H/O measles   . Osteoarthritis (arthritis due to wear and tear of joints)    Bilateral knees    She has a past surgical history that includes Knee surgery; Cataract extraction (2013 & 2015); Total knee arthroplasty (Left, 04/23/2016); Abdominal hysterectomy (1995); and Breast biopsy (1995).   Her family history includes Breast cancer (age of onset: 30) in her mother; Heart defect in her daughter; Heart disease in her mother; Hyperlipidemia in her daughter; Hypertension in her brother, daughter, father, mother, and sister; Stroke in her brother and father; Vision loss in her paternal uncle.She reports that she has never smoked. She has never used smokeless tobacco. She reports current alcohol use. She reports that she  does not use drugs.  Current Outpatient Medications on File Prior to Visit  Medication Sig Dispense Refill  . Multiple Vitamins-Minerals (MULTIVITAMIN ADULTS 50+) TABS Take 1 tablet by mouth daily.     . pravastatin (PRAVACHOL) 20 MG tablet Take 1 tablet (20 mg total) by mouth at bedtime. 90 tablet 2  . Vitamin D, Ergocalciferol, (DRISDOL) 1.25 MG (50000 UNIT) CAPS capsule Take 1 capsule (50,000 Units total) by mouth every 7 (seven) days. 12 capsule 1   No current facility-administered medications on file prior to visit.     Objective:  Objective  Physical Exam Vitals and nursing note reviewed.  Constitutional:      Appearance: She is well-developed.  HENT:     Head: Normocephalic and atraumatic.  Eyes:     Conjunctiva/sclera: Conjunctivae normal.  Neck:     Thyroid: No thyromegaly.     Vascular: No carotid bruit or JVD.  Cardiovascular:     Rate and Rhythm: Normal rate and regular rhythm.     Heart sounds: Normal heart sounds. No murmur heard.   Pulmonary:     Effort: Pulmonary effort is normal. No respiratory distress.     Breath sounds: Normal breath sounds. No wheezing or rales.  Chest:     Chest wall: No tenderness.  Musculoskeletal:     Cervical back: Normal range of motion and neck supple.  Neurological:     Mental Status: She is alert and oriented to person, place, and time.    BP 136/78 (BP Location: Right Arm, Patient Position: Sitting, Cuff Size: Normal)   Pulse 84   Temp 97.8 F (36.6 C) (Temporal)   Resp 18   Ht 5'  4" (1.626 m)   Wt 151 lb (68.5 kg)   SpO2 98%   BMI 25.92 kg/m  Wt Readings from Last 3 Encounters:  03/24/20 151 lb (68.5 kg)  02/25/20 145 lb (65.8 kg)  11/26/19 155 lb 9.6 oz (70.6 kg)     Lab Results  Component Value Date   WBC 8.6 02/25/2020   HGB 13.3 02/25/2020   HCT 40.3 02/25/2020   PLT 377.0 02/25/2020   GLUCOSE 73 02/25/2020   CHOL 205 (H) 03/24/2020   TRIG 92.0 03/24/2020   HDL 61.00 03/24/2020   LDLCALC 126 (H)  03/24/2020   ALT 10 02/25/2020   AST 23 02/25/2020   NA 140 02/25/2020   K 4.1 02/25/2020   CL 104 02/25/2020   CREATININE 0.51 02/25/2020   BUN 11 02/25/2020   CO2 29 02/25/2020   TSH 1.13 02/25/2020   INR 1.03 04/19/2016   HGBA1C 5.7 06/08/2019    CT ABDOMEN PELVIS W CONTRAST  Result Date: 11/27/2019 CLINICAL DATA:  Left-sided abdominal pain. Evaluate for diverticulitis. EXAM: CT ABDOMEN AND PELVIS WITH CONTRAST TECHNIQUE: Multidetector CT imaging of the abdomen and pelvis was performed using the standard protocol following bolus administration of intravenous contrast. CONTRAST:  165mL OMNIPAQUE IOHEXOL 300 MG/ML  SOLN COMPARISON:  02/11/2006 FINDINGS: Lower chest: Limited visualization of the lower thorax demonstrates minimal subsegmental atelectasis involving the medial basilar aspect of the image right lower lobe (image 8, series 4). There is a punctate (approximately 0.5 cm) noncalcified nodule within the image right lower lobe (image 8, series 4). Minimal dependent subpleural ground-glass atelectasis within the imaged left lower lobe. No pleural effusion. Normal heart size.  No pericardial effusion. Hepatobiliary: Normal hepatic contour. No discrete hepatic lesions. Normal appearance of the gallbladder given degree distention. No radiopaque gallstones. No intra or extrahepatic biliary ductal dilatation. No ascites. Pancreas: Normal appearance of the pancreas. Spleen: Normal appearance of the spleen. Adrenals/Urinary Tract: There is symmetric enhancement and excretion of the bilateral kidneys. No definite renal stones on this postcontrast examination. No discrete renal lesions. No urinary obstruction or perinephric stranding. Normal appearance the bilateral adrenal glands. Normal appearance of the urinary bladder given degree distention. Stomach/Bowel: Ingested enteric contrast extends to the level of the rectum. No evidence of enteric obstruction. Normal appearance of the terminal ileum and  appendix. No discrete areas of bowel wall thickening. No pneumoperitoneum, pneumatosis or portal venous gas. Vascular/Lymphatic: Normal caliber of the abdominal aorta. The major branch vessels of the abdominal aorta appear patent on this non CTA examination. No bulky retroperitoneal, mesenteric, pelvic or inguinal lymphadenopathy. Reproductive: Post hysterectomy.  No discrete adnexal lesions. Other: There is an indeterminate approximately 8.5 x 4.6 x 3.3 cm fluid collection within the left iliacus musculature (coronal image 49, series 5; axial image 63, series 2). No associated adjacent stranding. This fluid collection does not definitively communicate with the adjacent left hip joint. Tiny mesenteric fat containing periumbilical hernia. Musculoskeletal: No acute or aggressive osseous abnormalities. 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 No evidence of avascular necrosis. Moderate to severe multilevel lumbar spine DDD, worse at L4-L5 with disc space height loss, endplate irregularity and sclerosis. Stigmata of dish within the thoracic spine. IMPRESSION: 1. Indeterminate approximately 8.5 cm fluid collection within the left iliacus musculature without associated adjacent inflammatory change. While this fluid collection does not definitively appear to communicate with the adjacent left hip joint space, there is severe degenerative change of the left hip  joint with near complete joint space loss, subchondral sclerosis and osteophytosis. 2. Otherwise, no explanation for patient's left-sided abdominal pain. Specifically, no evidence of enteric or urinary obstruction. No evidence of diverticulitis or appendicitis. 3. Incidentally noted punctate (5 mm) noncalcified right lower lobe pulmonary nodule. No follow-up needed if patient is low-risk. Non-contrast chest CT can be considered in 12 months if patient is high-risk. This recommendation follows  the consensus statement: Guidelines for Management of Incidental Pulmonary Nodules Detected on CT Images: From the Fleischner Society 2017; Radiology 2017; 284:228-243. Electronically Signed   By: Sandi Mariscal M.D.   On: 11/27/2019 11:03     Assessment & Plan:  Plan  I am having Melanie Lee maintain her Multivitamin Adults 50+, pravastatin, and Vitamin D (Ergocalciferol).  No orders of the defined types were placed in this encounter.   Problem List Items Addressed This Visit      Unprioritized   Hyperlipidemia LDL goal <100    Encouraged heart healthy diet, increase exercise, avoid trans fats, consider a krill oil cap daily      Vitamin D deficiency    Vita d 50,000u weekly and vita d 3 daily       Weight loss    improveing with eating 3x a day and ensure/boost       Other Visit Diagnoses    Hyperlipidemia associated with type 2 diabetes mellitus ([place])    -  Primary   Relevant Orders   Lipid panel (Completed)   Encounter for screening mammogram for malignant neoplasm of breast       Relevant Orders   MM 3D SCREEN BREAST BILATERAL      Follow-up: Return in about 3 months (around 06/24/2020), or if symptoms worsen or fail to improve, for f/u pcp.  Ann Held, DO

## 2020-03-24 NOTE — Assessment & Plan Note (Signed)
Encouraged heart healthy diet, increase exercise, avoid trans fats, consider a krill oil cap daily 

## 2020-03-24 NOTE — Assessment & Plan Note (Signed)
Vita d 50,000u weekly and vita d 3 daily

## 2020-03-24 NOTE — Assessment & Plan Note (Signed)
improveing with eating 3x a day and ensure/boost

## 2020-03-24 NOTE — Patient Instructions (Addendum)
Vitamin D Deficiency Vitamin D deficiency is when your body does not have enough vitamin D. Vitamin D is important to your body for many reasons:  It helps the body absorb two important minerals--calcium and phosphorus.  It plays a role in bone health.  It may help to prevent some diseases, such as diabetes and multiple sclerosis.  It plays a role in muscle function, including heart function. If vitamin D deficiency is severe, it can cause a condition in which your bones become soft. In adults, this condition is called osteomalacia. In children, this condition is called rickets. What are the causes? This condition may be caused by:  Not eating enough foods that contain vitamin D.  Not getting enough natural sun exposure.  Having certain digestive system diseases that make it difficult for your body to absorb vitamin D. These diseases include Crohn's disease, chronic pancreatitis, and cystic fibrosis.  Having a surgery in which a part of the stomach or a part of the small intestine is removed.  Having chronic kidney disease or liver disease. What increases the risk? You are more likely to develop this condition if you:  Are older.  Do not spend much time outdoors.  Live in a long-term care facility.  Have had broken bones.  Have weak or thin bones (osteoporosis).  Have a disease or condition that changes how the body absorbs vitamin D.  Have dark skin.  Take certain medicines, such as steroid medicines or certain seizure medicines.  Are overweight or obese. What are the signs or symptoms? In mild cases of vitamin D deficiency, there may not be any symptoms. If the condition is severe, symptoms may include:  Bone pain.  Muscle pain.  Falling often.  Broken bones caused by a minor injury. How is this diagnosed? This condition may be diagnosed with blood tests. Imaging tests such as X-rays may also be done to look for changes in the bone. How is this  treated? Treatment for this condition may depend on what caused the condition. Treatment options include:  Taking vitamin D supplements. Your health care provider will suggest what dose is best for you.  Taking a calcium supplement. Your health care provider will suggest what dose is best for you. Follow these instructions at home: Eating and drinking   Eat foods that contain vitamin D. Choices include: ? Fortified dairy products, cereals, or juices. Fortified means that vitamin D has been added to the food. Check the label on the package to see if the food is fortified. ? Fatty fish, such as salmon or trout. ? Eggs. ? Oysters. ? Mushrooms. The items listed above may not be a complete list of recommended foods and beverages. Contact a dietitian for more information. General instructions  Take medicines and supplements only as told by your health care provider.  Get regular, safe exposure to natural sunlight.  Do not use a tanning bed.  Maintain a healthy weight. Lose weight if needed.  Keep all follow-up visits as told by your health care provider. This is important. How is this prevented? You can get vitamin D by:  Eating foods that naturally contain vitamin D.  Eating or drinking products that have been fortified with vitamin D, such as cereals, juices, and dairy products (including milk).  Taking a vitamin D supplement or a multivitamin supplement that contains vitamin D.  Being in the sun. Your body naturally makes vitamin D when your skin is exposed to sunlight. Your body changes the sunlight into  a form of the vitamin that it can use. Contact a health care provider if:  Your symptoms do not go away.  You feel nauseous or you vomit.  You have fewer bowel movements than usual or are constipated. Summary  Vitamin D deficiency is when your body does not have enough vitamin D.  Vitamin D is important to your body for good bone health and muscle function, and it may  help prevent some diseases.  Vitamin D deficiency is primarily treated through supplementation. Your health care provider will suggest what dose is best for you.  You can get vitamin D by eating foods that contain vitamin D, by being in the sun, and by taking a vitamin D supplement or a multivitamin supplement that contains vitamin D. This information is not intended to replace advice given to you by your health care provider. Make sure you discuss any questions you have with your health care provider. Document Revised: 06/08/2018 Document Reviewed: 06/08/2018 Elsevier Patient Education  [employer] [place].     High-Protein and High-Calorie Diet Eating high-protein and high-calorie foods can help you to gain weight, heal after an injury, and recover after an illness or surgery. The specific amount of daily protein and calories you need depends on:  Your body weight.  The reason this diet is recommended for you. What is my plan? Generally, a high-protein, high-calorie diet involves:  Eating 250-500 extra calories each day.  Making sure that you get enough of your daily calories from protein. Ask your health care provider how many of your calories should come from protein. Talk with a health care provider, such as a diet and nutrition specialist (dietitian), about how much protein and how many calories you need each day. Follow the diet as directed by your health care provider. What are tips for following this plan?  Preparing meals  Add whole milk, half-and-half, or heavy cream to cereal, pudding, soup, or hot cocoa.  Add whole milk to instant breakfast drinks.  Add peanut butter to oatmeal or smoothies.  Add powdered milk to baked goods, smoothies, or milkshakes.  Add powdered milk, cream, or butter to mashed potatoes.  Add cheese to cooked vegetables.  Make whole-milk yogurt parfaits. Top them with granola, fruit, or nuts.  Add cottage cheese to your fruit.  Add avocado,  cheese, or both to sandwiches or salads.  Add meat, poultry, or seafood to rice, pasta, casseroles, salads, and soups.  Use mayonnaise when making egg salad, chicken salad, or tuna salad.  Use peanut butter as a dip for vegetables or as a topping for pretzels, celery, or crackers.  Add beans to casseroles, dips, and spreads.  Add pureed beans to sauces and soups.  Replace calorie-free drinks with calorie-containing drinks, such as milk and fruit juice.  Replace water with milk or heavy cream when making foods such as oatmeal, pudding, or cocoa. General instructions  Ask your health care provider if you should take a nutritional supplement.  Try to eat six small meals each day instead of three large meals.  Eat a balanced diet. In each meal, include one food that is high in protein.  Keep nutritious snacks available, such as nuts, trail mixes, dried fruit, and yogurt.  If you have kidney disease or diabetes, talk with your health care provider about how much protein is safe for you. Too much protein may put extra stress on your kidneys.  Drink your calories. Choose high-calorie drinks and have them after your meals. What  high-protein foods should I eat?  Vegetables Soybeans. Peas. Grains Quinoa. Bulgur wheat. Meats and other proteins Beef, pork, and poultry. Fish and seafood. Eggs. Tofu. Textured vegetable protein (TVP). Peanut butter. Nuts and seeds. Dried beans. Protein powders. Dairy Whole milk. Whole-milk yogurt. Powdered milk. Cheese. [employer]. Eggnog. Beverages High-protein supplement drinks. Soy milk. Other foods Protein bars. The items listed above may not be a complete list of high-protein foods and beverages. Contact a dietitian for more options. What high-calorie foods should I eat? Fruits Dried fruit. Fruit leather. Canned fruit in syrup. Fruit juice. Avocado. Vegetables Vegetables cooked in oil or butter. Fried potatoes. Grains Pasta. Quick breads.  Muffins. Pancakes. Ready-to-eat cereal. Meats and other proteins Peanut butter. Nuts and seeds. Dairy Heavy cream. Whipped cream. Cream cheese. Sour cream. Ice cream. Custard. Pudding. Beverages Meal-replacement beverages. Nutrition shakes. Fruit juice. Sugar-sweetened soft drinks. Seasonings and condiments Salad dressing. Mayonnaise. Alfredo sauce. Fruit preserves or jelly. Honey. Syrup. Sweets and desserts Cake. Cookies. Pie. Pastries. Candy bars. Chocolate. Fats and oils Butter or margarine. Oil. Gravy. Other foods Meal-replacement bars. The items listed above may not be a complete list of high-calorie foods and beverages. Contact a dietitian for more options. Summary  A high-protein, high-calorie diet can help you gain weight or heal faster after an injury, illness, or surgery.  To increase your protein and calories, add ingredients such as whole milk, peanut butter, cheese, beans, meat, or seafood to meal items.  To get enough extra calories each day, include high-calorie foods and beverages at each meal.  Adding a high-calorie drink or shake can be an easy way to help you get enough calories each day. Talk with your healthcare provider or dietitian about the best options for you. This information is not intended to replace advice given to you by your health care provider. Make sure you discuss any questions you have with your health care provider. Document Revised: 09/12/2017 Document Reviewed: 08/12/2017 Elsevier Patient Education  2020 [employer].

## 2020-03-27 ENCOUNTER — Other Ambulatory Visit: Payer: Self-pay | Admitting: Family Medicine

## 2020-03-27 DIAGNOSIS — R928 Other abnormal and inconclusive findings on diagnostic imaging of breast: Secondary | ICD-10-CM

## 2020-03-29 ENCOUNTER — Other Ambulatory Visit: Payer: Self-pay | Admitting: *Deleted

## 2020-03-29 ENCOUNTER — Encounter: Payer: Self-pay | Admitting: *Deleted

## 2020-03-29 MED ORDER — PRAVASTATIN SODIUM 20 MG PO TABS: 20.0000 mg | ORAL_TABLET | Freq: Every day | ORAL | 3 refills | Status: DC

## 2020-04-11 DIAGNOSIS — M1711 Unilateral primary osteoarthritis, right knee: Secondary | ICD-10-CM | POA: Diagnosis not present

## 2020-04-11 DIAGNOSIS — M1612 Unilateral primary osteoarthritis, left hip: Secondary | ICD-10-CM | POA: Diagnosis not present

## 2020-04-11 DIAGNOSIS — R350 Frequency of micturition: Secondary | ICD-10-CM | POA: Diagnosis not present

## 2020-05-08 DIAGNOSIS — G8929 Other chronic pain: Secondary | ICD-10-CM | POA: Diagnosis not present

## 2020-05-08 DIAGNOSIS — M25552 Pain in left hip: Secondary | ICD-10-CM | POA: Diagnosis not present

## 2020-05-15 DIAGNOSIS — Z713 Dietary counseling and surveillance: Secondary | ICD-10-CM | POA: Diagnosis not present

## 2020-05-18 ENCOUNTER — Telehealth: Payer: Self-pay | Admitting: Medical

## 2020-05-18 ENCOUNTER — Ambulatory Visit: Payer: BC Managed Care – PPO | Admitting: Medical

## 2020-05-18 ENCOUNTER — Ambulatory Visit (HOSPITAL_BASED_OUTPATIENT_CLINIC_OR_DEPARTMENT_OTHER)
Admission: RE | Admit: 2020-05-18 | Discharge: 2020-05-18 | Disposition: A | Discharge status: Home / Self Care | Payer: BC Managed Care – PPO | Source: Ambulatory Visit | Attending: Medical | Admitting: Medical

## 2020-05-18 ENCOUNTER — Other Ambulatory Visit: Payer: Self-pay

## 2020-05-18 VITALS — BP 140/74 | HR 76 | Temp 98.1°F | Resp 18 | Ht 64.0 in | Wt 147.2 lb

## 2020-05-18 DIAGNOSIS — M25512 Pain in left shoulder: Secondary | ICD-10-CM

## 2020-05-18 DIAGNOSIS — M25552 Pain in left hip: Secondary | ICD-10-CM

## 2020-05-18 DIAGNOSIS — Z96652 Presence of left artificial knee joint: Secondary | ICD-10-CM | POA: Diagnosis not present

## 2020-05-18 DIAGNOSIS — R319 Hematuria, unspecified: Secondary | ICD-10-CM

## 2020-05-18 DIAGNOSIS — M25559 Pain in unspecified hip: Secondary | ICD-10-CM

## 2020-05-18 DIAGNOSIS — M25562 Pain in left knee: Secondary | ICD-10-CM

## 2020-05-18 DIAGNOSIS — R350 Frequency of micturition: Secondary | ICD-10-CM

## 2020-05-18 DIAGNOSIS — M1612 Unilateral primary osteoarthritis, left hip: Secondary | ICD-10-CM | POA: Diagnosis not present

## 2020-05-18 DIAGNOSIS — M19012 Primary osteoarthritis, left shoulder: Secondary | ICD-10-CM | POA: Diagnosis not present

## 2020-05-18 DIAGNOSIS — M47816 Spondylosis without myelopathy or radiculopathy, lumbar region: Secondary | ICD-10-CM | POA: Diagnosis not present

## 2020-05-18 DIAGNOSIS — Z471 Aftercare following joint replacement surgery: Secondary | ICD-10-CM | POA: Diagnosis not present

## 2020-05-18 DIAGNOSIS — I878 Other specified disorders of veins: Secondary | ICD-10-CM | POA: Diagnosis not present

## 2020-05-18 LAB — POC URINALSYSI DIPSTICK (AUTOMATED)
Bilirubin, UA: NEGATIVE
Blood, UA: POSITIVE
Glucose, UA: NEGATIVE
Ketones, UA: NEGATIVE
Leukocytes, UA: NEGATIVE
Nitrite, UA: NEGATIVE
Protein, UA: NEGATIVE
Spec Grav, UA: 1.02 (ref 1.010–1.025)
Urobilinogen, UA: 0.2 E.U./dL
pH, UA: 5.5 (ref 5.0–8.0)

## 2020-05-18 MED ORDER — KETOROLAC TROMETHAMINE 60 MG/2ML IM SOLN
60.0000 mg | Freq: Once | INTRAMUSCULAR | Status: AC
  Administered 2020-05-18: 60 mg via INTRAMUSCULAR

## 2020-05-18 MED ORDER — PREDNISONE 10 MG (21) PO TBPK
ORAL_TABLET | ORAL | 0 refills | Status: DC
Start: 2020-05-18 — End: 2020-12-01

## 2020-05-18 NOTE — Progress Notes (Signed)
Subjective:    Patient ID: Melanie Lee, female    DOB: 06/18/1946, 74 y.o.   MRN: [id number]  HPI  Pt in with left hip pain for 2 weeks. Pain when she walks and get up and down from seated.Pt had this pain before. She daily pain for 6 months but now getting worse.  Pt knee hurts as well.  Pt taking tylenol for pain  Pt also mentions at work she asked to check urine for blood. Some was found. 2 months ago some trace blood in urine.  Pt states 9-10 pain level when walks.   Also left shoulder pain for a day or two no injury. Pain on rom.   Review of Systems  Constitutional: Negative for chills, fatigue, fever and unexpected weight change.  Respiratory: Negative for chest tightness, shortness of breath and wheezing.   Cardiovascular: Negative for chest pain.  Gastrointestinal: Negative for abdominal pain.  Musculoskeletal: Negative for back pain.       Left hip pain and knee pain.  Skin: Negative for rash.    Past Medical History:  Diagnosis Date  . Elevated blood pressure (not hypertension)    "elevates sometimes with MD visits"  . Frequent headaches    04-19-16 "no longer bothered"- since no cholesterol med   . H/O measles   . 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 Marital status: Single    Spouse name: Not on file  . Number of children: Not on file  . Years of education: Not on file  . Highest education level: Not on file  Occupational History  . Not on file  Tobacco Use  . Smoking status: Never Smoker  . Smokeless tobacco: Never Used  Vaping Use  . Vaping Use: Never used  Substance and Sexual Activity  . Alcohol use: Yes    Alcohol/week: 0.0 standard drinks    Comment: rare  . Drug use: No  . Sexual activity: Not Currently  Other Topics Concern  . 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   Social Determinants of Health   Financial  Resource Strain:   . Difficulty of Paying Living Expenses:   Food Insecurity:   . Worried About Charity fundraiser in the Last Year:   . Arboriculturist in the Last Year:   Transportation Needs:   . Film/video editor (Medical):   Marland Kitchen Lack of Transportation (Non-Medical):   Physical Activity:   . Days of Exercise per Week:   . Minutes of Exercise per Session:   Stress:   . Feeling of Stress :   Social Connections:   . Frequency of Communication with Friends and Family:   . Frequency of Social Gatherings with Friends and Family:   . Attends Religious Services:   . Active Member of Clubs or Organizations:   . Attends Archivist Meetings:   Marland Kitchen Marital Status:   Intimate Partner Violence:   . Fear of Current or Ex-Partner:   . Emotionally Abused:   Marland Kitchen Physically Abused:   . Sexually Abused:     Past Surgical History:  Procedure Laterality Date  . ABDOMINAL HYSTERECTOMY  1995   total for fibroids  . BREAST BIOPSY  1995   benign  . CATARACT EXTRACTION  2013 & 2015   Bilateral  . KNEE SURGERY  Left Arthroscopy  . 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 Breast cancer Mother 74       Deceased  . Heart disease Mother   . Hypertension Mother   . Hypertension Father        Deceased  . Stroke Father   . Hypertension Sister   . Hypertension Brother   . Heart defect Daughter   . Hyperlipidemia Daughter   . Hypertension Daughter   . Vision loss Paternal Uncle   . Stroke Brother     Allergies  Allergen Reactions  . Tramadol Nausea Only and Other (See Comments)    Dizziness    Current Outpatient Medications on File Prior to Visit  Medication Sig Dispense Refill  . Multiple Vitamins-Minerals (MULTIVITAMIN ADULTS 50+) TABS Take 1 tablet by mouth daily.     . pravastatin  (PRAVACHOL) 20 MG tablet Take 1 tablet (20 mg total) by mouth at bedtime. 90 tablet 3  . Vitamin D, Ergocalciferol, (DRISDOL) 1.25 MG (50000 UNIT) CAPS capsule Take 1 capsule (50,000 Units total) by mouth every 7 (seven) days. 12 capsule 1   No current facility-administered medications on file prior to visit.    BP 140/74   Pulse 76   Temp 98.1 F (36.7 C) (Oral)   Resp 18   Ht 5\' 4"  (1.626 m)   Wt 147 lb 3.2 oz (66.8 kg)   SpO2 99%   BMI 25.27 kg/m       Objective:   Physical Exam  General- No acute distress. Pleasant patient. Lungs- Clear, even and unlabored. Heart- regular rate and rhythm. Neurologic- CNII- XII grossly intact.  Left hip- tender to palpation and rom. Left knee- no crepitus in rom. No instabiity. Left shoulder- pain on rom. Difficulty abducting arm above shoulder level.       Assessment & Plan:  For hip and knee pain will get xrays today.   For shoulder pain get xray as well.  Rx taper prednisone. toradol 60 mg im today. May rx norco after xray.  Also will get urine for hx of blood and frequent urination. Will refer to urologist if blood again and follow culture to see if infection.  Follow up 10-14 days or as needed  Time spent with patient today was 30  minutes which consisted of chart review, discussing  4 diagnoses, work up,  Treatment, answering questions and documentation.

## 2020-05-18 NOTE — Telephone Encounter (Signed)
Referral to urologist placed. 

## 2020-05-18 NOTE — Telephone Encounter (Signed)
Referral to orthopedist placed. 

## 2020-05-18 NOTE — Patient Instructions (Addendum)
For hip and knee pain will get xrays today.   For shoulder pain get xray as well.  Rx taper prednisone. toradol 60 mg im today. May rx norco after xray.  Also will get urine for hx of blood and frequent urination. Will refer to urologist if blood again and follow culture to see if infection.  Follow up 10-14 days or as needed

## 2020-05-19 LAB — URINE CULTURE
MICRO NUMBER:: 10791694
SPECIMEN QUALITY:: ADEQUATE

## 2020-05-23 DIAGNOSIS — M1711 Unilateral primary osteoarthritis, right knee: Secondary | ICD-10-CM | POA: Diagnosis not present

## 2020-05-30 DIAGNOSIS — M1612 Unilateral primary osteoarthritis, left hip: Secondary | ICD-10-CM | POA: Diagnosis not present

## 2020-05-31 DIAGNOSIS — M1612 Unilateral primary osteoarthritis, left hip: Secondary | ICD-10-CM | POA: Diagnosis not present

## 2020-05-31 DIAGNOSIS — E559 Vitamin D deficiency, unspecified: Secondary | ICD-10-CM | POA: Diagnosis not present

## 2020-05-31 DIAGNOSIS — Z79899 Other long term (current) drug therapy: Secondary | ICD-10-CM | POA: Diagnosis not present

## 2020-06-06 ENCOUNTER — Telehealth: Payer: Self-pay

## 2020-06-06 MED ORDER — HYDROCODONE-ACETAMINOPHEN 5-325 MG PO TABS: 1.0000 | ORAL_TABLET | Freq: Four times a day (QID) | ORAL | 0 refills | Status: DC | PRN

## 2020-06-06 NOTE — Telephone Encounter (Signed)
Please call pt and advise that sent in norco which is stronger pain medicine with potential more side effects. Sedation and dizziness can occur. I don't want her working due to potential side effects. She should be taking it easy anyway in light of her hip condition.   Offer work note/excuse for at least one week.  My concern for her continue to work is will use med and may fall. Better to stay at home and rest. Use norco sparingly.

## 2020-06-06 NOTE — Telephone Encounter (Signed)
Pt's daughter, Levada Dy, called requesting something stronger than Tramadol for pt's bone-on-bone hip pain.  Levada Dy stated pt is in really bad pain from her hip and knee pain.  They are waiting on ortho to call back to schedule hip surgery for pt.  It is looking like surgery will be scheduled in September.  Per Levada Dy, ortho will not prescribe meds for pt.  She stated her mother still works and is on her feet all day and comes home in pain every evening.

## 2020-06-07 NOTE — Telephone Encounter (Signed)
Daughter notified that letter has been faxed.

## 2020-06-07 NOTE — Telephone Encounter (Signed)
Sent note to pt my chart.

## 2020-06-07 NOTE — Telephone Encounter (Signed)
Letter placed up front for daughter to pick up

## 2020-06-07 NOTE — Telephone Encounter (Signed)
New Message:   Pt's daughter is calling and would like to know if we can fax the work note for the pt to [phone] and put Attn: Langley Gauss. She states to also put the pt's name on there as well. Please advise.

## 2020-06-07 NOTE — Telephone Encounter (Signed)
Pt daughter notified & daughter will advise mom of side effects and not needing to work so she would like the work note for up to one week out of work.

## 2020-06-12 ENCOUNTER — Encounter: Payer: Self-pay | Admitting: Medical

## 2020-06-12 ENCOUNTER — Ambulatory Visit: Payer: BC Managed Care – PPO | Admitting: Medical

## 2020-06-12 ENCOUNTER — Telehealth: Payer: Self-pay

## 2020-06-12 ENCOUNTER — Other Ambulatory Visit: Payer: Self-pay

## 2020-06-12 ENCOUNTER — Telehealth: Payer: Self-pay | Admitting: Medical

## 2020-06-12 VITALS — BP 124/70 | HR 83 | Ht 64.0 in | Wt 145.0 lb

## 2020-06-12 DIAGNOSIS — R030 Elevated blood-pressure reading, without diagnosis of hypertension: Secondary | ICD-10-CM

## 2020-06-12 DIAGNOSIS — R319 Hematuria, unspecified: Secondary | ICD-10-CM

## 2020-06-12 DIAGNOSIS — M25552 Pain in left hip: Secondary | ICD-10-CM | POA: Diagnosis not present

## 2020-06-12 MED ORDER — METHYLPREDNISOLONE 4 MG PO TABS
ORAL_TABLET | ORAL | 0 refills | Status: DC
Start: 2020-06-12 — End: 2020-12-01

## 2020-06-12 MED ORDER — HYDROCODONE-ACETAMINOPHEN 5-325 MG PO TABS: ORAL_TABLET | ORAL | 0 refills | Status: DC

## 2020-06-12 NOTE — Progress Notes (Signed)
Subjective:    Patient ID: Melanie Lee, female    DOB: Jan 05, 1946, 74 y.o.   MRN: [id number]  HPI  Pt in states she is feeling nausea and fatigued. Seems to correlate with use of norco. No sob, no wheezing, no vomting, no skin rash or lethargy. No dizziness reported.  Pt has been waiting to get surgery. Pt is waiting on date for orthopedic surgery.  IMPRESSION: Degenerative changes lumbar spine and both hips. Degenerative changes about the left hip are particularly severe. Sclerotic changes about the left femoral head noted. These changes may be degenerative, avascular necrosis cannot be excluded. No acute abnormality identified.   Pt is not diabetic. On review of old records I don't see that mri done and pt/family do not report given avacsular necrosis diagnosis.  Severe pain with walking and working. I have advised her not to work and we have written a note. Had written norco since tramadol did not help and she was complaining of severe unbearable pain as she waits for surgery.   On Thursday and Friday getting some nausea with hydrocodone and having intermittent ha.   Pt bp is elevated little and she has not hx of htn.       Review of Systems  Constitutional: Positive for fatigue.  Respiratory: Negative for cough, shortness of breath and wheezing.   Cardiovascular: Negative for chest pain and palpitations.  Gastrointestinal: Positive for nausea. Negative for abdominal distention, abdominal pain and vomiting.  Musculoskeletal: Negative for back pain and neck pain.       Left hip pain  Skin: Negative for rash.  Neurological: Positive for headaches. Negative for dizziness.       Mild ha after use norco or maybe with slight bp elevation. No neuro signs or symptoms . No ha today.  Hematological: Negative for adenopathy.  Psychiatric/Behavioral: Negative for behavioral problems, dysphoric mood, hallucinations and suicidal ideas. The patient is not nervous/anxious.      Past Medical History:  Diagnosis Date  . Elevated blood pressure (not hypertension)    "elevates sometimes with MD visits"  . Frequent headaches    04-19-16 "no longer bothered"- since no cholesterol med   . H/O measles   . 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 Marital status: Single    Spouse name: Not on file  . Number of children: Not on file  . Years of education: Not on file  . Highest education level: Not on file  Occupational History  . Not on file  Tobacco Use  . Smoking status: Never Smoker  . Smokeless tobacco: Never Used  Vaping Use  . Vaping Use: Never used  Substance and Sexual Activity  . Alcohol use: Yes    Alcohol/week: 0.0 standard drinks    Comment: rare  . Drug use: No  . Sexual activity: Not Currently  Other Topics Concern  . 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   Social Determinants of Health   Financial Resource Strain:   . Difficulty of Paying Living Expenses: Not on file  Food Insecurity:   . Worried About Charity fundraiser in the Last Year: Not on file  . Ran Out of Food in the Last Year: Not on file  Transportation Needs:   . Lack of Transportation (Medical): Not on file  . Lack of Transportation (Non-Medical): Not on  file  Physical Activity:   . Days of Exercise per Week: Not on file  . Minutes of Exercise per Session: Not on file  Stress:   . Feeling of Stress : Not on file  Social Connections:   . Frequency of Communication with Friends and Family: Not on file  . Frequency of Social Gatherings with Friends and Family: Not on file  . Attends Religious Services: Not on file  . Active Member of Clubs or Organizations: Not on file  . Attends Archivist Meetings: Not on file  . Marital Status: Not on file  Intimate Partner Violence:   . Fear of Current or Ex-Partner: Not on file  . Emotionally Abused:  Not on file  . Physically Abused: Not on file  . Sexually Abused: Not on file    Past Surgical History:  Procedure Laterality Date  . ABDOMINAL HYSTERECTOMY  1995   total for fibroids  . BREAST BIOPSY  1995   benign  . CATARACT EXTRACTION  2013 & 2015   Bilateral  . KNEE SURGERY     Left Arthroscopy  . 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 Breast cancer Mother 78       Deceased  . Heart disease Mother   . Hypertension Mother   . Hypertension Father        Deceased  . Stroke Father   . Hypertension Sister   . Hypertension Brother   . Heart defect Daughter   . Hyperlipidemia Daughter   . Hypertension Daughter   . Vision loss Paternal Uncle   . Stroke Brother     Allergies  Allergen Reactions  . Tramadol Nausea Only and Other (See Comments)    Dizziness    Current Outpatient Medications on File Prior to Visit  Medication Sig Dispense Refill  . HYDROcodone-acetaminophen (NORCO) 5-325 MG tablet Take 1 tablet by mouth every 6 (six) hours as needed for moderate pain or severe pain. 20 tablet 0  . Multiple Vitamins-Minerals (MULTIVITAMIN ADULTS 50+) TABS Take 1 tablet by mouth daily.     . pravastatin (PRAVACHOL) 20 MG tablet Take 1 tablet (20 mg total) by mouth at bedtime. 90 tablet 3  . predniSONE (STERAPRED UNI-PAK 21 TAB) 10 MG (21) TBPK tablet Standard taper over 6 days. (Patient not taking: Reported on 06/12/2020) 21 tablet 0  . Vitamin D, Ergocalciferol, (DRISDOL) 1.25 MG (50000 UNIT) CAPS capsule Take 1 capsule (50,000 Units total) by mouth every 7 (seven) days. 12 capsule 1   No current facility-administered medications on file prior to visit.    BP 124/70   Pulse 83   Ht 5\' 4"  (1.626 m)   Wt 145 lb (65.8 kg)   SpO2 94%   BMI 24.89 kg/m      Objective:   Physical  Exam  General Mental Status- Alert. General Appearance- Not in acute distress.   Skin General: Color- Normal Color. Moisture- Normal Moisture.  Neck Carotid Arteries- Normal color. Moisture- Normal Moisture. No carotid bruits. No JVD.  Chest and Lung Exam Auscultation: Breath Sounds:-Normal.  Cardiovascular Auscultation:Rythm- Regular. Murmurs & Other Heart Sounds:Auscultation of the heart reveals- No Murmurs.  Abdomen Inspection:-Inspeection Normal. Palpation/Percussion:Note:No mass. Palpation and Percussion of the abdomen reveal- Non Tender, Non Distended + BS, no rebound or guarding.    Neurologic  Cranial Nerve exam:- CN III-XII intact(No nystagmus), symmetric smile. Strength:- 5/5 equal and symmetric strength both upper and lower extremities.  Lef hip- pain on rom and pain on palpation.    Assessment & Plan:  For hip pain rx lower dose norco to avoid side effects. Will add 6 day taper medrol to see if this helps pain. Call ortho and ask/push for sooner surgery date. May consider adding gabapentin but discuss with pcp as well due to challenge of pain control.  Work note not to work. Also do not operate heavy machinery/drive while on norco.  Check bp daily to see if bp over 140/90. No ha presently and good neuro exam. Want to know if bp over 140/90.  New referral to urologist for hemauria placed.  Follow up 10 days pcp or as needed  Time spent with patient today was 45+  minutes which consisted of chart review, discussing diagnosis, work up, treatment options in detail along with risk, work note discussion  and documentation.

## 2020-06-12 NOTE — Patient Instructions (Addendum)
For hip pain rx lower dose norco to avoid side effects. Will add 6 day taper medrol to see if this helps pain. Call ortho and ask/push for sooner surgery date. May consider adding gabapentin but discuss with pcp as well due to challenge of pain control.  Work note not to work. Also do not operate heavy machinery/drive while on norco.  Check bp daily to see if bp over 140/90. No ha presently and good neuro exam. Want to know if bp over 140/90.  New referral to urologist for hemauria placed.  Follow up 10 days pcp or as needed

## 2020-06-12 NOTE — Telephone Encounter (Signed)
Please send pt records on hematuria to uroloigst at [facility]. I put in new referral . She self referred. appt next wed 10th

## 2020-06-12 NOTE — Telephone Encounter (Signed)
Referral has been faxed to [facility]

## 2020-06-12 NOTE — Telephone Encounter (Signed)
Work note faxed with confirmation to denise [phone]

## 2020-06-13 ENCOUNTER — Telehealth: Payer: Self-pay | Admitting: Family Medicine

## 2020-06-13 NOTE — Telephone Encounter (Signed)
Called pt and unable to leave message

## 2020-06-13 NOTE — Telephone Encounter (Signed)
Caller ; Melanie Lee Back # [phone]  Patient states she would like to return to work next week 06/19/2020. Patient will schedule surgery next week , please revise note written to allow patient to return to work. Fax letter to [facility] @ [phone]

## 2020-06-13 NOTE — Telephone Encounter (Signed)
We discussed best for her not to work. Pt, daughter and myself all in agreement.  When is pt surgery. After surgery ortho will give final say on when she can return?

## 2020-06-14 NOTE — Telephone Encounter (Signed)
No surgery date has been scheduled

## 2020-06-16 ENCOUNTER — Encounter: Payer: Self-pay | Admitting: Medical

## 2020-06-16 ENCOUNTER — Ambulatory Visit: Payer: BC Managed Care – PPO | Admitting: Medical

## 2020-06-16 ENCOUNTER — Other Ambulatory Visit: Payer: Self-pay

## 2020-06-16 VITALS — BP 138/81 | HR 76 | Resp 18 | Ht 64.0 in | Wt 144.6 lb

## 2020-06-16 DIAGNOSIS — R519 Headache, unspecified: Secondary | ICD-10-CM | POA: Diagnosis not present

## 2020-06-16 DIAGNOSIS — R350 Frequency of micturition: Secondary | ICD-10-CM | POA: Diagnosis not present

## 2020-06-16 DIAGNOSIS — M25552 Pain in left hip: Secondary | ICD-10-CM | POA: Diagnosis not present

## 2020-06-16 DIAGNOSIS — R252 Cramp and spasm: Secondary | ICD-10-CM

## 2020-06-16 LAB — POC URINALSYSI DIPSTICK (AUTOMATED)
Bilirubin, UA: NEGATIVE
Blood, UA: NEGATIVE
Glucose, UA: NEGATIVE
Ketones, UA: NEGATIVE
Leukocytes, UA: NEGATIVE
Nitrite, UA: NEGATIVE
Protein, UA: NEGATIVE
Spec Grav, UA: 1.02 (ref 1.010–1.025)
Urobilinogen, UA: 0.2 E.U./dL
pH, UA: 5.5 (ref 5.0–8.0)

## 2020-06-16 MED ORDER — FLUTICASONE PROPIONATE 50 MCG/ACT NA SUSP
2.0000 | Freq: Every day | NASAL | 1 refills | Status: DC
Start: 2020-06-16 — End: 2024-01-15

## 2020-06-16 MED ORDER — HYDROCODONE-ACETAMINOPHEN 5-325 MG PO TABS: ORAL_TABLET | ORAL | 0 refills | Status: DC

## 2020-06-16 NOTE — Patient Instructions (Signed)
You have chronic hip pain for which surgery has been recommended.  Unfortunately surgery date has been postponed with no date and site presently due to Covid surge.  You have reasonable pain control with Norco.  Will refill 5-day prescription today.  Will need to discuss whether or not you will be on contract with your PCP.  You request work note to be seated during the entire shift.  That was written today.  You can use Tylenol 500 mg to 650 mg in the morning.  When you arrive back from work you can use 1/2-1 Norco around 4 PM and Norco again before you go to sleep.  Rx advisement given.  Recent frequent urination.  You gave urine POCT today in the office and will send urine out for culture.  Recent mild intermittent cramps of cast.  Will get CMP and magnesium level.  You report recent intermittent mild headaches.  Worse in the morning.  I want you to check your blood pressures daily and make sure blood pressure not exceeding 188 systolic or 90 diastolic.  Some mild elevation might be related to chronic pain.  You report Flonase in the past to help with prior headaches.  If so then problem degree of allergic rhinitis/sinus pressure.  If you have any neurologic signs or symptoms associated with headache as discussed then recommend ED evaluation.  Follow-up in 2 weeks or as needed.

## 2020-06-16 NOTE — Progress Notes (Signed)
Subjective:    Patient ID: Melanie Lee, female    DOB: 1946/04/24, 74 y.o.   MRN: [id number]  HPI  Pt in for follow up.  Pt daughter give update that surgery is on hold. [facility] cancelled all surgery that will required 24-48 hour stay. Also rehab center also not accepting new pt. Until covid cases better.  Pt wants to work though I did not think good idea particularly if standing. So pt states work can find her seated duty. Pt has severe hip pain. See last note. Surgery recommended. Severe pain daily.  She has severe pain. Norco is helping some and not oversedated and not having dizziness.  Pt has been having mild ha on and off. No ha now. HA can be in morning. Pt daughter states in the past she would respond well to nasal spray. Pt does not check her bp. No cuff at home.  Lower leg cramps intermittent at times.    Review of Systems  Constitutional: Negative for chills, fatigue and fever.  Respiratory: Negative for cough, chest tightness, shortness of breath and wheezing.   Cardiovascular: Negative for chest pain and palpitations.  Gastrointestinal: Negative for abdominal pain, blood in stool, constipation, diarrhea, nausea and rectal pain.  Musculoskeletal: Negative for back pain, gait problem, myalgias and neck stiffness.       Hip pain.  Skin: Negative for rash.  Neurological: Negative for dizziness, tremors, numbness and headaches.  Hematological: Negative for adenopathy. Does not bruise/bleed easily.  Psychiatric/Behavioral: Negative for behavioral problems and confusion.    Past Medical History:  Diagnosis Date  . Elevated blood pressure (not hypertension)    "elevates sometimes with MD visits"  . Frequent headaches    04-19-16 "no longer bothered"- since no cholesterol med   . H/O measles   . 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 Marital status: Single    Spouse  name: Not on file  . Number of children: Not on file  . Years of education: Not on file  . Highest education level: Not on file  Occupational History  . Not on file  Tobacco Use  . Smoking status: Never Smoker  . Smokeless tobacco: Never Used  Vaping Use  . Vaping Use: Never used  Substance and Sexual Activity  . Alcohol use: Yes    Alcohol/week: 0.0 standard drinks    Comment: rare  . Drug use: No  . Sexual activity: Not Currently  Other Topics Concern  . 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   Social Determinants of Health   Financial Resource Strain:   . Difficulty of Paying Living Expenses: Not on file  Food Insecurity:   . Worried About Charity fundraiser in the Last Year: Not on file  . Ran Out of Food in the Last Year: Not on file  Transportation Needs:   . Lack of Transportation (Medical): Not on file  . Lack of Transportation (Non-Medical): Not on file  Physical Activity:   . Days of Exercise per Week: Not on file  . Minutes of Exercise per Session: Not on file  Stress:   . Feeling of Stress : Not on file  Social Connections:   . Frequency of Communication with Friends and Family: Not on file  . Frequency of Social Gatherings with Friends and Family: Not on file  .  Attends Religious Services: Not on file  . Active Member of Clubs or Organizations: Not on file  . Attends Archivist Meetings: Not on file  . Marital Status: Not on file  Intimate Partner Violence:   . Fear of Current or Ex-Partner: Not on file  . Emotionally Abused: Not on file  . Physically Abused: Not on file  . Sexually Abused: Not on file    Past Surgical History:  Procedure Laterality Date  . ABDOMINAL HYSTERECTOMY  1995   total for fibroids  . BREAST BIOPSY  1995   benign  . CATARACT EXTRACTION  2013 & 2015   Bilateral  . KNEE SURGERY     Left Arthroscopy  . 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/2016   Procedure: TOTAL KNEE ARTHROPLASTY;   Surgeon: Latanya Maudlin, MD;  Location: [facility];  Service: Orthopedics;  Laterality: Left;    Family History  Problem Relation Age of Onset  . Breast cancer Mother 72       Deceased  . Heart disease Mother   . Hypertension Mother   . Hypertension Father        Deceased  . Stroke Father   . Hypertension Sister   . Hypertension Brother   . Heart defect Daughter   . Hyperlipidemia Daughter   . Hypertension Daughter   . Vision loss Paternal Uncle   . Stroke Brother     Allergies  Allergen Reactions  . Tramadol Nausea Only and Other (See Comments)    Dizziness    Current Outpatient Medications on File Prior to Visit  Medication Sig Dispense Refill  . HYDROcodone-acetaminophen (NORCO) 5-325 MG tablet Take 1 tablet by mouth every 6 (six) hours as needed for moderate pain or severe pain. 20 tablet 0  . HYDROcodone-acetaminophen (NORCO) 5-325 MG tablet 1/2-1 tab every 8 hours prn pain 15 tablet 0  . methylPREDNISolone (MEDROL) 4 MG tablet Standard 6 day taper 21 tablet 0  . Multiple Vitamins-Minerals (MULTIVITAMIN ADULTS 50+) TABS Take 1 tablet by mouth daily.     . pravastatin (PRAVACHOL) 20 MG tablet Take 1 tablet (20 mg total) by mouth at bedtime. 90 tablet 3  . predniSONE (STERAPRED UNI-PAK 21 TAB) 10 MG (21) TBPK tablet Standard taper over 6 days. (Patient not taking: Reported on 06/12/2020) 21 tablet 0  . Vitamin D, Ergocalciferol, (DRISDOL) 1.25 MG (50000 UNIT) CAPS capsule Take 1 capsule (50,000 Units total) by mouth every 7 (seven) days. 12 capsule 1   No current facility-administered medications on file prior to visit.    BP 138/81   Pulse 76   Resp 18   Ht 5\' 4"  (1.626 m)   Wt 144 lb 9.6 oz (65.6 kg)   SpO2 99%   BMI 24.82 kg/m       Objective:   Physical Exam  General Mental Status- Alert. General Appearance- Not in acute distress.   Skin General: Color- Normal Color. Moisture- Normal Moisture.  Neck Carotid Arteries- Normal color. Moisture- Normal  Moisture. No carotid bruits. No JVD.  Chest and Lung Exam Auscultation: Breath Sounds:-Normal.  Cardiovascular Auscultation:Rythm- Regular. Murmurs & Other Heart Sounds:Auscultation of the heart reveals- No Murmurs.  Abdomen Inspection:-Inspeection Normal. Palpation/Percussion:Note:No mass. Palpation and Percussion of the abdomen reveal- Non Tender, Non Distended + BS, no rebound or guarding.    Neurologic Cranial Nerve exam:- CN III-XII intact(No nystagmus), symmetric smile. Strength:- 5/5 equal and symmetric strength both upper and lower extremities.      Assessment & Plan:  You  have chronic hip pain for which surgery has been recommended.  Unfortunately surgery date has been postponed with no date and site presently due to Covid surge.  You have reasonable pain control with Norco.  Will refill 5-day prescription today.  Will need to discuss whether or not you will be on contract with your PCP.  You request work note to be seated during the entire shift.  That was written today.  You can use Tylenol 500 mg to 650 mg in the morning.  When you arrive back from work you can use 1/2-1 Norco around 4 PM and Norco again before you go to sleep.  Rx advisement given.  Recent frequent urination.  You gave urine POCT today in the office and will send urine out for culture.  Recent mild intermittent cramps of cast.  Will get CMP and magnesium level.  You report recent intermittent mild headaches.  Worse in the morning.  I want you to check your blood pressures daily and make sure blood pressure not exceeding 703 systolic or 90 diastolic.  Some mild elevation might be related to chronic pain.  You report Flonase in the past to help with prior headaches.  If so then problem degree of allergic rhinitis/sinus pressure.  If you have any neurologic signs or symptoms associated with headache as discussed then recommend ED evaluation.  Follow-up in 2 weeks or as needed.   Time spent with patient  today was  50 minutes which consisted of chart review, discussing diagnosis, work up, treatment and documentation.

## 2020-06-17 LAB — MAGNESIUM: Magnesium: 2.3 mg/dL (ref 1.5–2.5)

## 2020-06-17 LAB — COMPREHENSIVE METABOLIC PANEL
AG Ratio: 1.3 (calc) (ref 1.0–2.5)
ALT: 8 U/L (ref 6–29)
AST: 14 U/L (ref 10–35)
Albumin: 4.7 g/dL (ref 3.6–5.1)
Alkaline phosphatase (APISO): 84 U/L (ref 37–153)
BUN: 13 mg/dL (ref 7–25)
CO2: 25 mmol/L (ref 20–32)
Calcium: 10.2 mg/dL (ref 8.6–10.4)
Chloride: 102 mmol/L (ref 98–110)
Creat: 0.65 mg/dL (ref 0.60–0.93)
Globulin: 3.5 g/dL (calc) (ref 1.9–3.7)
Glucose, Bld: 89 mg/dL (ref 65–99)
Potassium: 4.4 mmol/L (ref 3.5–5.3)
Sodium: 139 mmol/L (ref 135–146)
Total Bilirubin: 0.3 mg/dL (ref 0.2–1.2)
Total Protein: 8.2 g/dL — ABNORMAL HIGH (ref 6.1–8.1)

## 2020-06-17 LAB — EXTRA LAV TOP TUBE

## 2020-06-17 LAB — URINE CULTURE
MICRO NUMBER:: 10910757
Result:: NO GROWTH
SPECIMEN QUALITY:: ADEQUATE

## 2020-06-20 ENCOUNTER — Telehealth: Payer: Self-pay | Admitting: Family Medicine

## 2020-06-20 NOTE — Telephone Encounter (Addendum)
Caller : Melanie Lee  Call Back # [phone]  Patient states she is calling to check status of pain mgt contract.  Please Advise   Pt has severe hip pain and was scheduled to have sugery. I believe hip replacement. Surgery was delayed indefinitely due to covid outbreaks/hospitalizations.  Side effects with tramadol. Pain is severe so have been prescribing limited norco. I wanted to run by request to have pain on contract. I think she may be able to get by with just using 60 tabs a month. Want your ok/clearance since you are pcp. She seems to be getting by with about 2 a day now.  IMPRESSION: Degenerative changes lumbar spine and both hips. Degenerative changes about the left hip are particularly severe. Sclerotic changes about the left femoral head noted. These changes may be degenerative, avascular necrosis cannot be excluded. No acute abnormality identified.

## 2020-06-20 NOTE — Telephone Encounter (Signed)
Am willing to support Norco bid prn, disp #60. Hav eher sign contract and perform UDS and we can do this for next 3 months and then reevaluate

## 2020-06-21 ENCOUNTER — Telehealth: Payer: Self-pay | Admitting: Family Medicine

## 2020-06-21 DIAGNOSIS — R3129 Other microscopic hematuria: Secondary | ICD-10-CM | POA: Diagnosis not present

## 2020-06-21 DIAGNOSIS — R300 Dysuria: Secondary | ICD-10-CM | POA: Diagnosis not present

## 2020-06-21 DIAGNOSIS — R339 Retention of urine, unspecified: Secondary | ICD-10-CM | POA: Diagnosis not present

## 2020-06-21 NOTE — Telephone Encounter (Signed)
TY

## 2020-06-21 NOTE — Telephone Encounter (Signed)
Patient has appointment with Melanie Lee on Friday we can collect at that time.  Patient has been notified.

## 2020-06-21 NOTE — Telephone Encounter (Signed)
   Melanie Lee is calling back stating medical records need to be sent to [facility] fax # [phone]. Patient appointment is 06/21/20 at 10:30am.

## 2020-06-23 ENCOUNTER — Other Ambulatory Visit: Payer: Self-pay

## 2020-06-23 ENCOUNTER — Ambulatory Visit (INDEPENDENT_AMBULATORY_CARE_PROVIDER_SITE_OTHER): Payer: BC Managed Care – PPO | Admitting: Medical

## 2020-06-23 ENCOUNTER — Other Ambulatory Visit: Payer: BC Managed Care – PPO

## 2020-06-23 ENCOUNTER — Encounter: Payer: Self-pay | Admitting: Medical

## 2020-06-23 VITALS — BP 140/78 | HR 85 | Resp 18 | Ht 64.0 in | Wt 147.0 lb

## 2020-06-23 DIAGNOSIS — Z79899 Other long term (current) drug therapy: Secondary | ICD-10-CM

## 2020-06-23 DIAGNOSIS — M25552 Pain in left hip: Secondary | ICD-10-CM

## 2020-06-23 MED ORDER — HYDROCODONE-ACETAMINOPHEN 5-325 MG PO TABS: ORAL_TABLET | ORAL | 0 refills | Status: DC

## 2020-06-23 NOTE — Telephone Encounter (Signed)
Paperwork faxed per Drue Dun

## 2020-06-23 NOTE — Progress Notes (Signed)
Subjective:    Patient ID: Melanie Lee, female    DOB: 1945/11/12, 74 y.o.   MRN: [id number]  HPI  Pt here for follow up/to sign controlled med contract and to give uds.  Pt has been waiting to get surgery. Pt is waiting on date for orthopedic surgery.  IMPRESSION: Degenerative changes lumbar spine and both hips. Degenerative changes about the left hip are particularly severe. Sclerotic changes about the left femoral head noted. These changes may be degenerative, avascular necrosis cannot be excluded. No acute abnormality identified.  Severe pain walking. I had given her norco for pain but limited number. Consulted with pt pcp who agreed would place on contract for 3 months pending potential surgery.   Reassess in 3 months.     Review of Systems  Constitutional: Negative for chills, fatigue and fever.  Respiratory: Negative for cough, choking, chest tightness and wheezing.   Cardiovascular: Negative for chest pain and palpitations.  Gastrointestinal: Negative for abdominal pain.  Genitourinary: Negative for dysuria.  Musculoskeletal:       Severe pain.  Neurological: Negative for dizziness and headaches.  Hematological: Negative for adenopathy. Does not bruise/bleed easily.    Past Medical History:  Diagnosis Date  . Elevated blood pressure (not hypertension)    "elevates sometimes with MD visits"  . Frequent headaches    04-19-16 "no longer bothered"- since no cholesterol med   . H/O measles   . 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 Marital status: Single    Spouse name: Not on file  . Number of children: Not on file  . Years of education: Not on file  . Highest education level: Not on file  Occupational History  . Not on file  Tobacco Use  . Smoking status: Never Smoker  . Smokeless tobacco: Never Used  Vaping Use  . Vaping Use: Never used  Substance and Sexual  Activity  . Alcohol use: Yes    Alcohol/week: 0.0 standard drinks    Comment: rare  . Drug use: No  . Sexual activity: Not Currently  Other Topics Concern  . 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   Social Determinants of Health   Financial Resource Strain:   . Difficulty of Paying Living Expenses: Not on file  Food Insecurity:   . Worried About Charity fundraiser in the Last Year: Not on file  . Ran Out of Food in the Last Year: Not on file  Transportation Needs:   . Lack of Transportation (Medical): Not on file  . Lack of Transportation (Non-Medical): Not on file  Physical Activity:   . Days of Exercise per Week: Not on file  . Minutes of Exercise per Session: Not on file  Stress:   . Feeling of Stress : Not on file  Social Connections:   . Frequency of Communication with Friends and Family: Not on file  . Frequency of Social Gatherings with Friends and Family: Not on file  . Attends Religious Services: Not on file  . Active Member of Clubs or Organizations: Not on file  . Attends Archivist Meetings: Not on file  . Marital Status: Not on file  Intimate Partner Violence:   . Fear of Current or Ex-Partner: Not on file  . Emotionally Abused: Not on file  . Physically Abused: Not on file  .  Sexually Abused: Not on file    Past Surgical History:  Procedure Laterality Date  . ABDOMINAL HYSTERECTOMY  1995   total for fibroids  . BREAST BIOPSY  1995   benign  . CATARACT EXTRACTION  2013 & 2015   Bilateral  . KNEE SURGERY     Left Arthroscopy  . 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 Breast cancer Mother 59       Deceased  . Heart disease Mother   . Hypertension Mother   . Hypertension Father        Deceased  . Stroke Father   . Hypertension  Sister   . Hypertension Brother   . Heart defect Daughter   . Hyperlipidemia Daughter   . Hypertension Daughter   . Vision loss Paternal Uncle   . Stroke Brother     Allergies  Allergen Reactions  . Tramadol Nausea Only and Other (See Comments)    Dizziness    Current Outpatient Medications on File Prior to Visit  Medication Sig Dispense Refill  . fluticasone (FLONASE) 50 MCG/ACT nasal spray Place 2 sprays into both nostrils daily. 16 g 1  . methylPREDNISolone (MEDROL) 4 MG tablet Standard 6 day taper 21 tablet 0  . Multiple Vitamins-Minerals (MULTIVITAMIN ADULTS 50+) TABS Take 1 tablet by mouth daily.     . pravastatin (PRAVACHOL) 20 MG tablet Take 1 tablet (20 mg total) by mouth at bedtime. 90 tablet 3  . predniSONE (STERAPRED UNI-PAK 21 TAB) 10 MG (21) TBPK tablet Standard taper over 6 days. (Patient not taking: Reported on 06/12/2020) 21 tablet 0  . Vitamin D, Ergocalciferol, (DRISDOL) 1.25 MG (50000 UNIT) CAPS capsule Take 1 capsule (50,000 Units total) by mouth every 7 (seven) days. 12 capsule 1   No current facility-administered medications on file prior to visit.    BP 140/78   Pulse 85   Resp 18   Ht 5\' 4"  (1.626 m)   Wt 147 lb (66.7 kg)   SpO2 97%   BMI 25.23 kg/m       Objective:   Physical Exam  General- No acute distress. Pleasant patient. Neck- Full range of motion, no jvd Lungs- Clear, even and unlabored. Heart- regular rate and rhythm. Neurologic- CNII- XII grossly intact.       Assessment & Plan:  For severe chronic left hip pain, you gave uds and signed contract.  Your pcp agreed to provide meds for next 3 months. Hopefully surgeries will open back up after covid infections decrease.  Rx advisement given. Reminder on sedation side effects.  Reviewed rules and regulations.   Follow up 3 months or as needed.

## 2020-06-23 NOTE — Patient Instructions (Signed)
For severe chronic left hip pain, you gave uds and signed contract.  Your pcp agreed to provide meds for next 3 months. Hopefully surgeries will open back up after covid infections decrease.  Rx advisement given. Reminder on sedation side effects.  Reviewed rules and regulations.   Follow up 3 months or as needed.

## 2020-06-23 NOTE — Telephone Encounter (Signed)
Faxed via ROI to [phone] which is [facility] in [facility] on [address]. Please be sure to get correct practice names.

## 2020-06-25 LAB — DRUG MONITORING, PANEL 8 WITH CONFIRMATION, URINE
6 Acetylmorphine: NEGATIVE ng/mL (ref <10)
Alcohol Metabolites: NEGATIVE ng/mL
Amphetamines: NEGATIVE ng/mL (ref <500)
Benzodiazepines: NEGATIVE ng/mL (ref <100)
Buprenorphine, Urine: NEGATIVE ng/mL (ref <5)
Cocaine Metabolite: NEGATIVE ng/mL (ref <150)
Codeine: NEGATIVE ng/mL (ref <50)
Creatinine: 197 mg/dL
Hydrocodone: 1120 ng/mL — ABNORMAL HIGH (ref <50)
Hydromorphone: 569 ng/mL — ABNORMAL HIGH (ref <50)
MDMA: NEGATIVE ng/mL (ref <500)
Marijuana Metabolite: NEGATIVE ng/mL (ref <20)
Morphine: NEGATIVE ng/mL (ref <50)
Norhydrocodone: 469 ng/mL — ABNORMAL HIGH (ref <50)
Opiates: POSITIVE ng/mL — AB (ref <100)
Oxidant: NEGATIVE ug/mL
Oxycodone: NEGATIVE ng/mL (ref <100)
pH: 4.9 (ref 4.5–9.0)

## 2020-06-25 LAB — DM TEMPLATE

## 2020-06-27 DIAGNOSIS — M5416 Radiculopathy, lumbar region: Secondary | ICD-10-CM | POA: Diagnosis not present

## 2020-06-27 DIAGNOSIS — M5136 Other intervertebral disc degeneration, lumbar region: Secondary | ICD-10-CM | POA: Diagnosis not present

## 2020-06-27 DIAGNOSIS — M79604 Pain in right leg: Secondary | ICD-10-CM | POA: Diagnosis not present

## 2020-06-27 DIAGNOSIS — M4316 Spondylolisthesis, lumbar region: Secondary | ICD-10-CM | POA: Diagnosis not present

## 2020-07-05 ENCOUNTER — Telehealth: Payer: Self-pay | Admitting: Family Medicine

## 2020-07-05 NOTE — Telephone Encounter (Signed)
Mailed out CD of Mammo Screening to home address.  Pt was suppose to pick it up and never did

## 2020-07-17 DIAGNOSIS — Z01812 Encounter for preprocedural laboratory examination: Secondary | ICD-10-CM | POA: Diagnosis not present

## 2020-07-17 DIAGNOSIS — Z01810 Encounter for preprocedural cardiovascular examination: Secondary | ICD-10-CM | POA: Diagnosis not present

## 2020-07-17 DIAGNOSIS — I4510 Unspecified right bundle-branch block: Secondary | ICD-10-CM | POA: Diagnosis not present

## 2020-07-17 DIAGNOSIS — M1612 Unilateral primary osteoarthritis, left hip: Secondary | ICD-10-CM | POA: Diagnosis not present

## 2020-07-17 DIAGNOSIS — Z01818 Encounter for other preprocedural examination: Secondary | ICD-10-CM | POA: Diagnosis not present

## 2020-07-20 DIAGNOSIS — R3129 Other microscopic hematuria: Secondary | ICD-10-CM | POA: Diagnosis not present

## 2020-07-24 DIAGNOSIS — M1612 Unilateral primary osteoarthritis, left hip: Secondary | ICD-10-CM | POA: Diagnosis not present

## 2020-07-24 DIAGNOSIS — Z7982 Long term (current) use of aspirin: Secondary | ICD-10-CM | POA: Diagnosis not present

## 2020-07-24 DIAGNOSIS — E7800 Pure hypercholesterolemia, unspecified: Secondary | ICD-10-CM | POA: Diagnosis not present

## 2020-07-26 DIAGNOSIS — Z96652 Presence of left artificial knee joint: Secondary | ICD-10-CM | POA: Diagnosis not present

## 2020-07-26 DIAGNOSIS — Z7982 Long term (current) use of aspirin: Secondary | ICD-10-CM | POA: Diagnosis not present

## 2020-07-26 DIAGNOSIS — M1711 Unilateral primary osteoarthritis, right knee: Secondary | ICD-10-CM | POA: Diagnosis not present

## 2020-07-26 DIAGNOSIS — M4316 Spondylolisthesis, lumbar region: Secondary | ICD-10-CM | POA: Diagnosis not present

## 2020-07-26 DIAGNOSIS — M5416 Radiculopathy, lumbar region: Secondary | ICD-10-CM | POA: Diagnosis not present

## 2020-07-26 DIAGNOSIS — E785 Hyperlipidemia, unspecified: Secondary | ICD-10-CM | POA: Diagnosis not present

## 2020-07-26 DIAGNOSIS — Z96642 Presence of left artificial hip joint: Secondary | ICD-10-CM | POA: Diagnosis not present

## 2020-07-26 DIAGNOSIS — Z471 Aftercare following joint replacement surgery: Secondary | ICD-10-CM | POA: Diagnosis not present

## 2020-07-27 DIAGNOSIS — M4316 Spondylolisthesis, lumbar region: Secondary | ICD-10-CM | POA: Diagnosis not present

## 2020-07-27 DIAGNOSIS — M1711 Unilateral primary osteoarthritis, right knee: Secondary | ICD-10-CM | POA: Diagnosis not present

## 2020-07-27 DIAGNOSIS — M5416 Radiculopathy, lumbar region: Secondary | ICD-10-CM | POA: Diagnosis not present

## 2020-07-27 DIAGNOSIS — E785 Hyperlipidemia, unspecified: Secondary | ICD-10-CM | POA: Diagnosis not present

## 2020-07-27 DIAGNOSIS — Z96642 Presence of left artificial hip joint: Secondary | ICD-10-CM | POA: Diagnosis not present

## 2020-07-27 DIAGNOSIS — Z7982 Long term (current) use of aspirin: Secondary | ICD-10-CM | POA: Diagnosis not present

## 2020-07-27 DIAGNOSIS — Z471 Aftercare following joint replacement surgery: Secondary | ICD-10-CM | POA: Diagnosis not present

## 2020-07-27 DIAGNOSIS — Z96652 Presence of left artificial knee joint: Secondary | ICD-10-CM | POA: Diagnosis not present

## 2020-07-27 DIAGNOSIS — Z713 Dietary counseling and surveillance: Secondary | ICD-10-CM | POA: Diagnosis not present

## 2020-07-31 DIAGNOSIS — Z96652 Presence of left artificial knee joint: Secondary | ICD-10-CM | POA: Diagnosis not present

## 2020-07-31 DIAGNOSIS — Z96642 Presence of left artificial hip joint: Secondary | ICD-10-CM | POA: Diagnosis not present

## 2020-07-31 DIAGNOSIS — Z7982 Long term (current) use of aspirin: Secondary | ICD-10-CM | POA: Diagnosis not present

## 2020-07-31 DIAGNOSIS — Z471 Aftercare following joint replacement surgery: Secondary | ICD-10-CM | POA: Diagnosis not present

## 2020-07-31 DIAGNOSIS — M5416 Radiculopathy, lumbar region: Secondary | ICD-10-CM | POA: Diagnosis not present

## 2020-07-31 DIAGNOSIS — M4316 Spondylolisthesis, lumbar region: Secondary | ICD-10-CM | POA: Diagnosis not present

## 2020-07-31 DIAGNOSIS — E785 Hyperlipidemia, unspecified: Secondary | ICD-10-CM | POA: Diagnosis not present

## 2020-07-31 DIAGNOSIS — M1711 Unilateral primary osteoarthritis, right knee: Secondary | ICD-10-CM | POA: Diagnosis not present

## 2020-08-02 DIAGNOSIS — Z471 Aftercare following joint replacement surgery: Secondary | ICD-10-CM | POA: Diagnosis not present

## 2020-08-02 DIAGNOSIS — Z96642 Presence of left artificial hip joint: Secondary | ICD-10-CM | POA: Diagnosis not present

## 2020-08-02 DIAGNOSIS — M4316 Spondylolisthesis, lumbar region: Secondary | ICD-10-CM | POA: Diagnosis not present

## 2020-08-02 DIAGNOSIS — Z7982 Long term (current) use of aspirin: Secondary | ICD-10-CM | POA: Diagnosis not present

## 2020-08-02 DIAGNOSIS — Z96652 Presence of left artificial knee joint: Secondary | ICD-10-CM | POA: Diagnosis not present

## 2020-08-02 DIAGNOSIS — M1711 Unilateral primary osteoarthritis, right knee: Secondary | ICD-10-CM | POA: Diagnosis not present

## 2020-08-02 DIAGNOSIS — E785 Hyperlipidemia, unspecified: Secondary | ICD-10-CM | POA: Diagnosis not present

## 2020-08-02 DIAGNOSIS — M5416 Radiculopathy, lumbar region: Secondary | ICD-10-CM | POA: Diagnosis not present

## 2020-08-03 ENCOUNTER — Telehealth: Payer: Self-pay | Admitting: Family Medicine

## 2020-08-03 NOTE — Telephone Encounter (Signed)
Mostly she needs to eat well with lots of protein and calcium in diet, hydrate with 60-80 ounces of fluids daily. Try to do deep breathing exercises a couple times a day, rest but also move at times throughout the day as much as possible. Does she have home PT? This was a major surgery so it will take awhile for her to feel better. The pain do contribute to her feeling tired so take them only as needed. We can do a virtual visit if shewants to discuss more.

## 2020-08-03 NOTE — Telephone Encounter (Signed)
Patient's daughter called in reference to patient's recent hip replacement surgery , she wanted to let Dr Charlett Blake know , She has also been taking pain medication (precocet/ and vicodin). She wants to report that she was prescribed the medication by her surgeron. She reporting it b/c she has a pain mgt contract  with [place].   Patient also wanted Dr Charlett Blake to know that she feels drained and wanted to know what she could do to feel better. Patient was suppose to go to rehab after surgery but facility had a break out of covid.

## 2020-08-04 ENCOUNTER — Other Ambulatory Visit: Payer: Self-pay

## 2020-08-04 DIAGNOSIS — Z96652 Presence of left artificial knee joint: Secondary | ICD-10-CM | POA: Diagnosis not present

## 2020-08-04 DIAGNOSIS — M1711 Unilateral primary osteoarthritis, right knee: Secondary | ICD-10-CM | POA: Diagnosis not present

## 2020-08-04 DIAGNOSIS — Z7982 Long term (current) use of aspirin: Secondary | ICD-10-CM | POA: Diagnosis not present

## 2020-08-04 DIAGNOSIS — M5416 Radiculopathy, lumbar region: Secondary | ICD-10-CM | POA: Diagnosis not present

## 2020-08-04 DIAGNOSIS — Z96642 Presence of left artificial hip joint: Secondary | ICD-10-CM | POA: Diagnosis not present

## 2020-08-04 DIAGNOSIS — E785 Hyperlipidemia, unspecified: Secondary | ICD-10-CM | POA: Diagnosis not present

## 2020-08-04 DIAGNOSIS — E559 Vitamin D deficiency, unspecified: Secondary | ICD-10-CM

## 2020-08-04 DIAGNOSIS — Z471 Aftercare following joint replacement surgery: Secondary | ICD-10-CM | POA: Diagnosis not present

## 2020-08-04 DIAGNOSIS — M4316 Spondylolisthesis, lumbar region: Secondary | ICD-10-CM | POA: Diagnosis not present

## 2020-08-04 MED ORDER — VITAMIN D (ERGOCALCIFEROL) 1.25 MG (50000 UNIT) PO CAPS: 50000.0000 [IU] | ORAL_CAPSULE | ORAL | 1 refills | Status: DC

## 2020-08-04 NOTE — Telephone Encounter (Signed)
Spoke with pt.  Gave providers instruction.Pt is getting PT in Home 3 days q week

## 2020-08-07 ENCOUNTER — Telehealth: Payer: Self-pay | Admitting: Family Medicine

## 2020-08-07 DIAGNOSIS — Z96642 Presence of left artificial hip joint: Secondary | ICD-10-CM | POA: Diagnosis not present

## 2020-08-07 DIAGNOSIS — M1711 Unilateral primary osteoarthritis, right knee: Secondary | ICD-10-CM | POA: Diagnosis not present

## 2020-08-07 DIAGNOSIS — M5416 Radiculopathy, lumbar region: Secondary | ICD-10-CM | POA: Diagnosis not present

## 2020-08-07 DIAGNOSIS — Z471 Aftercare following joint replacement surgery: Secondary | ICD-10-CM | POA: Diagnosis not present

## 2020-08-07 DIAGNOSIS — Z96652 Presence of left artificial knee joint: Secondary | ICD-10-CM | POA: Diagnosis not present

## 2020-08-07 DIAGNOSIS — E785 Hyperlipidemia, unspecified: Secondary | ICD-10-CM | POA: Diagnosis not present

## 2020-08-07 DIAGNOSIS — M4316 Spondylolisthesis, lumbar region: Secondary | ICD-10-CM | POA: Diagnosis not present

## 2020-08-07 DIAGNOSIS — Z7982 Long term (current) use of aspirin: Secondary | ICD-10-CM | POA: Diagnosis not present

## 2020-08-07 NOTE — Telephone Encounter (Signed)
Pt was put on contract.   But on oct 12 and 13 she got prescription of oxycyodone and hydrocodone from other prescriber. This appears to be maybe from orthopedist post [place]. She can't get pain meds from more than one prescriber. Technically only from me if she signed contract.   Check if she signed contract.  Is she out of oxycodone and hydrocodone. She can't take both of those at same time.   Overdose is a concern.

## 2020-08-07 NOTE — Telephone Encounter (Signed)
Medication: HYDROcodone-acetaminophen (NORCO) 5-325 MG tablet [[id number]]   Has the patient contacted their pharmacy? No. (If no, request that the patient contact the pharmacy for the refill.) (If yes, when and what did the pharmacy advise?)  Preferred Pharmacy (with phone number or street name): [facility] [place], [place] - [address]  [address] [place] [address]  Phone:  [phone] Fax:  [phone]  DEA #:  --  Agent: Please be advised that RX refills may take up to 3 business days. We ask that you follow-up with your pharmacy.

## 2020-08-07 NOTE — Telephone Encounter (Signed)
Requesting: NORCO Contract:06/27/20 UDS:06/23/20 Last Visit:06/23/20 Next Visit:n/a Last Refill:06/23/20  Please Advise

## 2020-08-08 DIAGNOSIS — T8189XA Other complications of procedures, not elsewhere classified, initial encounter: Secondary | ICD-10-CM | POA: Diagnosis not present

## 2020-08-08 DIAGNOSIS — Z471 Aftercare following joint replacement surgery: Secondary | ICD-10-CM | POA: Diagnosis not present

## 2020-08-08 DIAGNOSIS — Z96642 Presence of left artificial hip joint: Secondary | ICD-10-CM | POA: Diagnosis not present

## 2020-08-08 MED ORDER — HYDROCODONE-ACETAMINOPHEN 5-325 MG PO TABS: ORAL_TABLET | ORAL | 0 refills | Status: DC

## 2020-08-08 NOTE — Telephone Encounter (Signed)
Spoke with patient's daughter ...   She stated the medicine was prescribed after patient's surgery and patient is currently out of medicine . And the reason she has 2 scripts on file after surgery is because one of them gave her a bad reactio.  Call back for daughter   [phone]

## 2020-08-08 NOTE — Telephone Encounter (Signed)
Pt daughter states oxycodone gave her headache. Also gave vicodin. Both stopped. No longer has any of these meds.   Advised not get any pain meds from others. Daugher expresses understanding.  When pt was on norco did not have any side effects.  Sent in 2 week refill 30 tabs.   Follow up in 2 weeks.

## 2020-08-09 DIAGNOSIS — Z471 Aftercare following joint replacement surgery: Secondary | ICD-10-CM | POA: Diagnosis not present

## 2020-08-09 DIAGNOSIS — M4316 Spondylolisthesis, lumbar region: Secondary | ICD-10-CM | POA: Diagnosis not present

## 2020-08-09 DIAGNOSIS — M5416 Radiculopathy, lumbar region: Secondary | ICD-10-CM | POA: Diagnosis not present

## 2020-08-09 DIAGNOSIS — M1711 Unilateral primary osteoarthritis, right knee: Secondary | ICD-10-CM | POA: Diagnosis not present

## 2020-08-09 DIAGNOSIS — E785 Hyperlipidemia, unspecified: Secondary | ICD-10-CM | POA: Diagnosis not present

## 2020-08-09 DIAGNOSIS — Z96642 Presence of left artificial hip joint: Secondary | ICD-10-CM | POA: Diagnosis not present

## 2020-08-09 DIAGNOSIS — Z96652 Presence of left artificial knee joint: Secondary | ICD-10-CM | POA: Diagnosis not present

## 2020-08-09 DIAGNOSIS — Z7982 Long term (current) use of aspirin: Secondary | ICD-10-CM | POA: Diagnosis not present

## 2020-08-10 DIAGNOSIS — M4316 Spondylolisthesis, lumbar region: Secondary | ICD-10-CM | POA: Diagnosis not present

## 2020-08-10 DIAGNOSIS — M5416 Radiculopathy, lumbar region: Secondary | ICD-10-CM | POA: Diagnosis not present

## 2020-08-10 DIAGNOSIS — Z96652 Presence of left artificial knee joint: Secondary | ICD-10-CM | POA: Diagnosis not present

## 2020-08-10 DIAGNOSIS — Z7982 Long term (current) use of aspirin: Secondary | ICD-10-CM | POA: Diagnosis not present

## 2020-08-10 DIAGNOSIS — Z471 Aftercare following joint replacement surgery: Secondary | ICD-10-CM | POA: Diagnosis not present

## 2020-08-10 DIAGNOSIS — M1711 Unilateral primary osteoarthritis, right knee: Secondary | ICD-10-CM | POA: Diagnosis not present

## 2020-08-10 DIAGNOSIS — E785 Hyperlipidemia, unspecified: Secondary | ICD-10-CM | POA: Diagnosis not present

## 2020-08-10 DIAGNOSIS — Z96642 Presence of left artificial hip joint: Secondary | ICD-10-CM | POA: Diagnosis not present

## 2020-08-14 ENCOUNTER — Other Ambulatory Visit: Payer: Self-pay

## 2020-08-14 ENCOUNTER — Emergency Department (HOSPITAL_BASED_OUTPATIENT_CLINIC_OR_DEPARTMENT_OTHER): Payer: BC Managed Care – PPO

## 2020-08-14 ENCOUNTER — Emergency Department (HOSPITAL_BASED_OUTPATIENT_CLINIC_OR_DEPARTMENT_OTHER)
Admission: EM | Admit: 2020-08-14 | Discharge: 2020-08-14 | Disposition: A | Discharge status: Home / Self Care | Payer: BC Managed Care – PPO | Attending: Emergency Medicine | Admitting: Emergency Medicine

## 2020-08-14 ENCOUNTER — Encounter (HOSPITAL_BASED_OUTPATIENT_CLINIC_OR_DEPARTMENT_OTHER): Payer: Self-pay | Admitting: *Deleted

## 2020-08-14 DIAGNOSIS — Z96642 Presence of left artificial hip joint: Secondary | ICD-10-CM | POA: Diagnosis not present

## 2020-08-14 DIAGNOSIS — Z96652 Presence of left artificial knee joint: Secondary | ICD-10-CM | POA: Diagnosis not present

## 2020-08-14 DIAGNOSIS — Z471 Aftercare following joint replacement surgery: Secondary | ICD-10-CM | POA: Diagnosis not present

## 2020-08-14 DIAGNOSIS — R42 Dizziness and giddiness: Secondary | ICD-10-CM | POA: Insufficient documentation

## 2020-08-14 DIAGNOSIS — E785 Hyperlipidemia, unspecified: Secondary | ICD-10-CM | POA: Diagnosis not present

## 2020-08-14 DIAGNOSIS — M25512 Pain in left shoulder: Secondary | ICD-10-CM | POA: Diagnosis not present

## 2020-08-14 DIAGNOSIS — R519 Headache, unspecified: Secondary | ICD-10-CM | POA: Diagnosis not present

## 2020-08-14 DIAGNOSIS — M47816 Spondylosis without myelopathy or radiculopathy, lumbar region: Secondary | ICD-10-CM | POA: Diagnosis not present

## 2020-08-14 DIAGNOSIS — M5416 Radiculopathy, lumbar region: Secondary | ICD-10-CM | POA: Diagnosis not present

## 2020-08-14 DIAGNOSIS — M4316 Spondylolisthesis, lumbar region: Secondary | ICD-10-CM | POA: Diagnosis not present

## 2020-08-14 DIAGNOSIS — Z7982 Long term (current) use of aspirin: Secondary | ICD-10-CM | POA: Diagnosis not present

## 2020-08-14 DIAGNOSIS — M1711 Unilateral primary osteoarthritis, right knee: Secondary | ICD-10-CM | POA: Diagnosis not present

## 2020-08-14 LAB — CBC WITH DIFFERENTIAL/PLATELET
Abs Immature Granulocytes: 0.05 10*3/uL (ref 0.00–0.07)
Basophils Absolute: 0.1 10*3/uL (ref 0.0–0.1)
Basophils Relative: 1 %
Eosinophils Absolute: 0.1 10*3/uL (ref 0.0–0.5)
Eosinophils Relative: 1 %
HCT: 42.7 % (ref 36.0–46.0)
Hemoglobin: 13.6 g/dL (ref 12.0–15.0)
Immature Granulocytes: 1 %
Lymphocytes Relative: 15 %
Lymphs Abs: 1.6 10*3/uL (ref 0.7–4.0)
MCH: 29.3 pg (ref 26.0–34.0)
MCHC: 31.9 g/dL (ref 30.0–36.0)
MCV: 92 fL (ref 80.0–100.0)
Monocytes Absolute: 0.8 10*3/uL (ref 0.1–1.0)
Monocytes Relative: 7 %
Neutro Abs: 8.4 10*3/uL — ABNORMAL HIGH (ref 1.7–7.7)
Neutrophils Relative %: 75 %
Platelets: 576 10*3/uL — ABNORMAL HIGH (ref 150–400)
RBC: 4.64 MIL/uL (ref 3.87–5.11)
RDW: 13.2 % (ref 11.5–15.5)
WBC: 11 10*3/uL — ABNORMAL HIGH (ref 4.0–10.5)
nRBC: 0 % (ref 0.0–0.2)

## 2020-08-14 LAB — BASIC METABOLIC PANEL
Anion gap: 13 (ref 5–15)
BUN: 12 mg/dL (ref 8–23)
CO2: 25 mmol/L (ref 22–32)
Calcium: 9.9 mg/dL (ref 8.9–10.3)
Chloride: 102 mmol/L (ref 98–111)
Creatinine, Ser: 0.6 mg/dL (ref 0.44–1.00)
GFR, Estimated: >60 mL/min (ref >60)
Glucose, Bld: 107 mg/dL — ABNORMAL HIGH (ref 70–99)
Potassium: 3.9 mmol/L (ref 3.5–5.1)
Sodium: 140 mmol/L (ref 135–145)

## 2020-08-14 MED ORDER — MAGNESIUM SULFATE IN D5W 1-5 GM/100ML-% IV SOLN
1.0000 g | Freq: Once | INTRAVENOUS | Status: AC
  Administered 2020-08-14: 1 g via INTRAVENOUS
  Filled 2020-08-14: qty 100

## 2020-08-14 MED ORDER — METOCLOPRAMIDE HCL 5 MG/ML IJ SOLN
5.0000 mg | Freq: Once | INTRAMUSCULAR | Status: AC
  Administered 2020-08-14: 5 mg via INTRAVENOUS
  Filled 2020-08-14: qty 2

## 2020-08-14 MED ORDER — KETOROLAC TROMETHAMINE 15 MG/ML IJ SOLN: 10.0000 mg | Freq: Once | INTRAMUSCULAR | Status: AC

## 2020-08-14 MED ORDER — KETOROLAC TROMETHAMINE 15 MG/ML IJ SOLN
INTRAMUSCULAR | Status: AC
  Administered 2020-08-14: 10 mg via INTRAVENOUS
  Filled 2020-08-14: qty 1

## 2020-08-14 NOTE — ED Provider Notes (Signed)
[facility] EMERGENCY DEPARTMENT Provider Note   CSN: [id number] Arrival date & time: 08/14/20  1441     History Chief Complaint  Patient presents with  . Headache    Melanie Lee is a 74 y.o. female.  The history is provided by the patient and medical records.  Headache  Melanie Lee is a 74 y.o. female who presents to the Emergency Department complaining of headache. She presents the emergency department accompanied by her daughter for evaluation of headache for the last three days. Headache is described as a pain throughout her entire head. She has associated dizziness. She feels like the room is spinning. Sometimes it waxes and wanes and sometimes it is constant nature. It is worse with sitting up. She does feel slightly unsteady at times when walking. She is recently status post left hip replacement surgery on October 11. She did have an epidural for the procedure. She denies any fevers, night sweats, shortness of breath, chest pain, cough, abdominal pain, nausea, vomiting, diarrhea, dysuria, numbness, weakness. She is not on any blood thinners. No prior similar symptoms.    Past Medical History:  Diagnosis Date  . Elevated blood pressure (not hypertension)    "elevates sometimes with MD visits"  . Frequent headaches    04-19-16 "no longer bothered"- since no cholesterol med   . H/O measles   . Osteoarthritis (arthritis due to wear and tear of joints)    Bilateral knees    Patient Active Problem List   Diagnosis Date Noted  . Weight loss 03/24/2020  . Vitamin D deficiency 03/24/2020  . Fatigue 06/03/2018  . Hyperlipidemia LDL goal <100 06/03/2018  . Daytime somnolence 06/03/2018  . UTI (urinary tract infection) 11/09/2017  . Hyperglycemia 11/09/2017  . Arthralgia 11/09/2017  . Strain of cervical portion of right trapezius muscle 02/07/2017  . Status post total left knee replacement 04/23/2016  . Hyperlipidemia 04/01/2016  . Other fatigue 03/21/2015  .  Visit for preventive health examination 10/28/2014  . Primary osteoarthritis of both knees 10/28/2014  . Colon cancer screening 10/28/2014  . Osteoporosis screening 10/28/2014    Past Surgical History:  Procedure Laterality Date  . ABDOMINAL HYSTERECTOMY  1995   total for fibroids  . BREAST BIOPSY  1995   benign  . CATARACT EXTRACTION  2013 & 2015   Bilateral  . KNEE SURGERY     Left Arthroscopy  . 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/2016   Procedure: TOTAL KNEE ARTHROPLASTY;  Surgeon: Latanya Maudlin, MD;  Location: [facility];  Service: Orthopedics;  Laterality: Left;     OB History   No obstetric history on file.     Family History  Problem Relation Age of Onset  . Breast cancer Mother 49       Deceased  . Heart disease Mother   . Hypertension Mother   . Hypertension Father        Deceased  . Stroke Father   . Hypertension Sister   . Hypertension Brother   . Heart defect Daughter   . Hyperlipidemia Daughter   . Hypertension Daughter   . Vision loss Paternal Uncle   . Stroke Brother     Social History   Tobacco Use  . Smoking status: Never Smoker  . Smokeless tobacco: Never Used  Vaping Use  . Vaping Use: Never used  Substance Use Topics  . Alcohol use: Yes    Alcohol/week: 0.0 standard drinks    Comment: rare  . Drug use: No  Home Medications Prior to Admission medications   Medication Sig Start Date End Date Taking? Authorizing Provider  meloxicam (MOBIC) 7.5 MG tablet Take by mouth.   Yes [provider]  methylPREDNISolone (MEDROL) 4 MG tablet Standard 6 day taper 06/12/20  Yes Saguier, Percell Miller, PA-C  Multiple Vitamins-Minerals (MULTIVITAMIN ADULTS 50+) TABS Take 1 tablet by mouth daily.    Yes [provider]  pravastatin (PRAVACHOL) 20 MG tablet Take 1 tablet (20 mg total) by mouth at bedtime. 03/29/20  Yes Mosie Lukes, MD  pregabalin (LYRICA) 25 MG capsule Take 1 capsule by mouth at bedtime. 06/27/20  Yes [provider]    Vibegron (GEMTESA PO) Take by mouth.   Yes [provider]  Vitamin D, Ergocalciferol, (DRISDOL) 1.25 MG (50000 UNIT) CAPS capsule Take 1 capsule (50,000 Units total) by mouth every 7 (seven) days. 08/04/20  Yes Mosie Lukes, MD  fluticasone (FLONASE) 50 MCG/ACT nasal spray Place 2 sprays into both nostrils daily. 06/16/20   Saguier, Percell Miller, PA-C  HYDROcodone-acetaminophen [facility]) 5-325 MG tablet 1 tab po bid prn severe pain 08/08/20   Saguier, Percell Miller, PA-C  predniSONE (STERAPRED UNI-PAK 21 TAB) 10 MG (21) TBPK tablet Standard taper over 6 days. Patient not taking: Reported on 06/12/2020 05/18/20   Saguier, Percell Miller, PA-C    Allergies    Tramadol  Review of Systems   Review of Systems  Neurological: Positive for headaches.  All other systems reviewed and are negative.   Physical Exam Updated Vital Signs BP (!) 132/91   Pulse 79   Temp 98.7 F (37.1 C) (Oral)   Resp 13   Ht 5\' 4"  (1.626 m)   Wt 66.7 kg   SpO2 95%   BMI 25.24 kg/m   Physical Exam Vitals and nursing note reviewed.  Constitutional:      Appearance: She is well-developed.  HENT:     Head: Normocephalic and atraumatic.  Eyes:     Extraocular Movements: Extraocular movements intact.     Pupils: Pupils are equal, round, and reactive to light.  Cardiovascular:     Rate and Rhythm: Normal rate and regular rhythm.     Heart sounds: No murmur heard.   Pulmonary:     Effort: Pulmonary effort is normal. No respiratory distress.     Breath sounds: Normal breath sounds.  Abdominal:     Palpations: Abdomen is soft.     Tenderness: There is no abdominal tenderness. There is no guarding or rebound.  Musculoskeletal:        General: No tenderness.  Skin:    General: Skin is warm and dry.  Neurological:     Mental Status: She is alert and oriented to person, place, and time.     Comments: Five out of five strength in all four extremities. Sensation light touch intact in all four extremities. No ataxia on  finger to nose bilaterally. EOM I. No asymmetry of facial movements.  Psychiatric:        Behavior: Behavior normal.     ED Results / Procedures / Treatments   Labs (all labs ordered are listed, but only abnormal results are displayed) Labs Reviewed  CBC WITH DIFFERENTIAL/PLATELET - Abnormal; Notable for the following components:      Result Value   WBC 11.0 (*)    Platelets 576 (*)    Neutro Abs 8.4 (*)    All other components within normal limits  BASIC METABOLIC PANEL - Abnormal; Notable for the following components:  Glucose, Bld 107 (*)    All other components within normal limits  URINALYSIS, ROUTINE W REFLEX MICROSCOPIC    EKG EKG Interpretation  Date/Time:  Monday August 14 2020 16:05:24 EDT Ventricular Rate:  90 PR Interval:    QRS Duration: 87 QT Interval:  358 QTC Calculation: 438 R Axis:   -49 Text Interpretation: Sinus rhythm Left anterior fascicular block Consider anterior infarct Confirmed by Quintella Reichert [phone]) on 08/14/2020 4:10:14 PM   Radiology DG Chest 2 View  Result Date: 08/14/2020 CLINICAL DATA:  Left shoulder pain. EXAM: CHEST - 2 VIEW COMPARISON:  Chest radiograph 07/17/2020 FINDINGS: The cardiomediastinal contours are normal. The lungs are clear. Pulmonary vasculature is normal. No consolidation, pleural effusion, or pneumothorax. No acute osseous abnormalities are seen. Again seen thoracolumbar spondylosis with endplate spurring. IMPRESSION: No acute chest findings.  Stable chest radiographs from 07/17/2020. Electronically Signed   By: Keith Rake M.D.   On: 08/14/2020 16:09   CT Head Wo Contrast  Result Date: 08/14/2020 CLINICAL DATA:  Dizziness, headache. EXAM: CT HEAD WITHOUT CONTRAST TECHNIQUE: Contiguous axial images were obtained from the base of the skull through the vertex without intravenous contrast. COMPARISON:  None. FINDINGS: Brain: No evidence of acute infarction, hemorrhage, hydrocephalus, extra-axial collection or mass  lesion/mass effect. Vascular: No hyperdense vessel or unexpected calcification. Skull: Normal. Negative for fracture or focal lesion. Sinuses/Orbits: No acute finding. Other: None. IMPRESSION: Normal head CT. Electronically Signed   By: Marijo Conception M.D.   On: 08/14/2020 16:50    Procedures Procedures (including critical care time)  Medications Ordered in ED Medications  metoCLOPramide (REGLAN) injection 5 mg (5 mg Intravenous Given 08/14/20 1618)  metoCLOPramide (REGLAN) injection 5 mg (5 mg Intravenous Given 08/14/20 1759)  ketorolac (TORADOL) 15 MG/ML injection 10 mg (10 mg Intravenous Given 08/14/20 1903)  magnesium sulfate IVPB 1 g 100 mL ( Intravenous Stopped 08/14/20 1928)    ED Course  I have reviewed the triage vital signs and the nursing notes.  Pertinent labs & imaging results that were available during my care of the patient were reviewed by me and considered in my medical decision making (see chart for details).    MDM Rules/Calculators/A&P                         patient here for evaluation of three days of headache, slightly worse with sitting up and occasional dizziness. She is non-toxic appearing on evaluation with no focal neurologic deficits. Imaging is not consistent with acute CVA, subarachnoid hemorrhage, temporal arteritis, meningitis, dural sinus thrombosis, posterior oral puncture headachepost dural puncture headache. Headache is partially improved on reassessment after treatment. Discussed with patient and daughter home care for headache. Discussed outpatient follow-up and return precautions.  Final Clinical Impression(s) / ED Diagnoses Final diagnoses:  Bad headache    Rx / DC Orders ED Discharge Orders    None       Quintella Reichert, MD 08/14/20 2003

## 2020-08-14 NOTE — ED Triage Notes (Signed)
Headache x 3 days. Pain in her left shoulder to her elbow. She has had xrays and referred to an orthopedist. She never followed up with the referred.

## 2020-08-16 DIAGNOSIS — Z7982 Long term (current) use of aspirin: Secondary | ICD-10-CM | POA: Diagnosis not present

## 2020-08-16 DIAGNOSIS — Z96652 Presence of left artificial knee joint: Secondary | ICD-10-CM | POA: Diagnosis not present

## 2020-08-16 DIAGNOSIS — M5416 Radiculopathy, lumbar region: Secondary | ICD-10-CM | POA: Diagnosis not present

## 2020-08-16 DIAGNOSIS — M1711 Unilateral primary osteoarthritis, right knee: Secondary | ICD-10-CM | POA: Diagnosis not present

## 2020-08-16 DIAGNOSIS — M4316 Spondylolisthesis, lumbar region: Secondary | ICD-10-CM | POA: Diagnosis not present

## 2020-08-16 DIAGNOSIS — Z96642 Presence of left artificial hip joint: Secondary | ICD-10-CM | POA: Diagnosis not present

## 2020-08-16 DIAGNOSIS — E785 Hyperlipidemia, unspecified: Secondary | ICD-10-CM | POA: Diagnosis not present

## 2020-08-16 DIAGNOSIS — Z471 Aftercare following joint replacement surgery: Secondary | ICD-10-CM | POA: Diagnosis not present

## 2020-08-21 DIAGNOSIS — Z96642 Presence of left artificial hip joint: Secondary | ICD-10-CM | POA: Diagnosis not present

## 2020-08-21 DIAGNOSIS — M1612 Unilateral primary osteoarthritis, left hip: Secondary | ICD-10-CM | POA: Diagnosis not present

## 2020-08-21 DIAGNOSIS — Z9181 History of falling: Secondary | ICD-10-CM | POA: Diagnosis not present

## 2020-08-21 DIAGNOSIS — Z471 Aftercare following joint replacement surgery: Secondary | ICD-10-CM | POA: Diagnosis not present

## 2020-08-21 DIAGNOSIS — Z7409 Other reduced mobility: Secondary | ICD-10-CM | POA: Diagnosis not present

## 2020-08-21 DIAGNOSIS — R531 Weakness: Secondary | ICD-10-CM | POA: Diagnosis not present

## 2020-08-24 DIAGNOSIS — Z7409 Other reduced mobility: Secondary | ICD-10-CM | POA: Diagnosis not present

## 2020-08-24 DIAGNOSIS — R531 Weakness: Secondary | ICD-10-CM | POA: Diagnosis not present

## 2020-08-24 DIAGNOSIS — M1612 Unilateral primary osteoarthritis, left hip: Secondary | ICD-10-CM | POA: Diagnosis not present

## 2020-08-24 DIAGNOSIS — Z471 Aftercare following joint replacement surgery: Secondary | ICD-10-CM | POA: Diagnosis not present

## 2020-08-24 DIAGNOSIS — Z96642 Presence of left artificial hip joint: Secondary | ICD-10-CM | POA: Diagnosis not present

## 2020-08-24 DIAGNOSIS — Z9181 History of falling: Secondary | ICD-10-CM | POA: Diagnosis not present

## 2020-08-29 DIAGNOSIS — Z96642 Presence of left artificial hip joint: Secondary | ICD-10-CM | POA: Diagnosis not present

## 2020-08-29 DIAGNOSIS — Z9181 History of falling: Secondary | ICD-10-CM | POA: Diagnosis not present

## 2020-08-29 DIAGNOSIS — R531 Weakness: Secondary | ICD-10-CM | POA: Diagnosis not present

## 2020-08-29 DIAGNOSIS — Z471 Aftercare following joint replacement surgery: Secondary | ICD-10-CM | POA: Diagnosis not present

## 2020-08-29 DIAGNOSIS — Z7409 Other reduced mobility: Secondary | ICD-10-CM | POA: Diagnosis not present

## 2020-08-29 DIAGNOSIS — M1612 Unilateral primary osteoarthritis, left hip: Secondary | ICD-10-CM | POA: Diagnosis not present

## 2020-08-31 DIAGNOSIS — M7552 Bursitis of left shoulder: Secondary | ICD-10-CM | POA: Diagnosis not present

## 2020-08-31 DIAGNOSIS — N3592 Unspecified urethral stricture, female: Secondary | ICD-10-CM | POA: Diagnosis not present

## 2020-08-31 DIAGNOSIS — M19012 Primary osteoarthritis, left shoulder: Secondary | ICD-10-CM | POA: Diagnosis not present

## 2020-09-01 DIAGNOSIS — Z96642 Presence of left artificial hip joint: Secondary | ICD-10-CM | POA: Diagnosis not present

## 2020-09-01 DIAGNOSIS — Z471 Aftercare following joint replacement surgery: Secondary | ICD-10-CM | POA: Diagnosis not present

## 2020-09-01 DIAGNOSIS — Z9181 History of falling: Secondary | ICD-10-CM | POA: Diagnosis not present

## 2020-09-01 DIAGNOSIS — M1612 Unilateral primary osteoarthritis, left hip: Secondary | ICD-10-CM | POA: Diagnosis not present

## 2020-09-01 DIAGNOSIS — R531 Weakness: Secondary | ICD-10-CM | POA: Diagnosis not present

## 2020-09-01 DIAGNOSIS — Z7409 Other reduced mobility: Secondary | ICD-10-CM | POA: Diagnosis not present

## 2020-09-04 ENCOUNTER — Other Ambulatory Visit: Payer: Self-pay

## 2020-09-04 ENCOUNTER — Ambulatory Visit (HOSPITAL_BASED_OUTPATIENT_CLINIC_OR_DEPARTMENT_OTHER)
Admission: RE | Admit: 2020-09-04 | Discharge: 2020-09-04 | Disposition: A | Discharge status: Home / Self Care | Payer: BC Managed Care – PPO | Source: Ambulatory Visit | Attending: Medical | Admitting: Medical

## 2020-09-04 ENCOUNTER — Encounter: Payer: Self-pay | Admitting: Medical

## 2020-09-04 ENCOUNTER — Ambulatory Visit (INDEPENDENT_AMBULATORY_CARE_PROVIDER_SITE_OTHER): Payer: BC Managed Care – PPO | Admitting: Medical

## 2020-09-04 VITALS — BP 125/70 | HR 84 | Temp 98.4°F | Ht 64.0 in | Wt 137.6 lb

## 2020-09-04 DIAGNOSIS — M19071 Primary osteoarthritis, right ankle and foot: Secondary | ICD-10-CM | POA: Diagnosis not present

## 2020-09-04 DIAGNOSIS — Z87898 Personal history of other specified conditions: Secondary | ICD-10-CM

## 2020-09-04 DIAGNOSIS — M79671 Pain in right foot: Secondary | ICD-10-CM | POA: Insufficient documentation

## 2020-09-04 DIAGNOSIS — M545 Low back pain, unspecified: Secondary | ICD-10-CM

## 2020-09-04 DIAGNOSIS — B351 Tinea unguium: Secondary | ICD-10-CM

## 2020-09-04 DIAGNOSIS — G629 Polyneuropathy, unspecified: Secondary | ICD-10-CM | POA: Diagnosis not present

## 2020-09-04 DIAGNOSIS — M7731 Calcaneal spur, right foot: Secondary | ICD-10-CM | POA: Diagnosis not present

## 2020-09-04 DIAGNOSIS — R252 Cramp and spasm: Secondary | ICD-10-CM | POA: Diagnosis not present

## 2020-09-04 NOTE — Progress Notes (Signed)
   Subjective:    Patient ID: Melanie Lee, female    DOB: Apr 29, 1946, 74 y.o.   MRN: [id number]  HPI  Pt has some numbness/tingling sensation to her rt distal foot area. Present for 2 months. Some back pain but no pain that radiates to rt leg.  Some times rt calf muscle cramp spasm randomly.  Hx of rt knee pain. Pt is on lyrica.    Review of Systems  Constitutional: Negative for chills, fatigue and fever.  Respiratory: Negative for cough, chest tightness, shortness of breath and wheezing.   Cardiovascular: Negative for chest pain and palpitations.  Gastrointestinal: Negative for abdominal pain.  Musculoskeletal: Positive for back pain.       Right foot pain.  Skin: Negative for rash.  Neurological:       See HPI.  Hematological: Negative for adenopathy. Does not bruise/bleed easily.  Psychiatric/Behavioral: Negative for behavioral problems, decreased concentration and dysphoric mood.       Objective:   Physical Exam  General Mental Status- Alert. General Appearance- Not in acute distress.   Skin General: Color- Normal Color. Moisture- Normal Moisture.  Neck Carotid Arteries- Normal color. Moisture- Normal Moisture. No carotid bruits. No JVD.  Chest and Lung Exam Auscultation: Breath Sounds:-Normal.  Cardiovascular Auscultation:Rythm- Regular. Murmurs & Other Heart Sounds:Auscultation of the heart reveals- No Murmurs.    Neurologic Cranial Nerve exam:- CN III-XII intact(No nystagmus), symmetric smile. Strength:- 5/5 equal and symmetric strength both upper and lower extremities.  Right foot-does appear to have hammertoes.  Very thick disfigured nails on all toes.  Decreased sensation at base of toes and metatarsal head region.  Pulses intact.  Capillary refill intact.      Assessment & Plan:  You do have recent right foot neuropathy type sensation with some feet pain.  Appears that you have arthritic changes to feet.  Also have thick dystrophic/nails with  apparent fungus.  We will get right foot x-ray today and refer to podiatrist.  For neuropathy we will get B12 and B1 level.  Recent intermittent right calf cramping.  Will get metabolic panel and magnesium level.  If you note calf cramping on walking short distances then would recommend ABI  For history of prediabetes we will get A1c today.  For history of daily intermittent back pain will get lumbar xray.See if you have any bone narrowing on the right side more nurse tested the spine.  Follow-up in 2 to 3 weeks or as needed.

## 2020-09-04 NOTE — Patient Instructions (Signed)
You do have recent right foot neuropathy type sensation with some feet pain.  Appears that you have arthritic changes to feet.  Also have thick dystrophic/nails with apparent fungus.  We will get right foot x-ray today and refer to podiatrist.  For neuropathy we will get B12 and B1 level.  Recent intermittent right calf cramping.  Will get metabolic panel and magnesium level.  If you note calf cramping on walking short distances then would recommend ABI  For history of prediabetes we will get A1c today.  For history of daily intermittent back pain will get lumbar xray.See if you have any bone narrowing on the right side more nurse tested the spine.  Follow-up in 2 to 3 weeks or as needed.                  Marland Kitchen+

## 2020-09-04 NOTE — Addendum Note (Signed)
Addended by: Manuela Schwartz on: 09/04/2020 02:42 PM   Modules accepted: Orders

## 2020-09-04 NOTE — Addendum Note (Signed)
Addended by: Anabel Halon on: 09/04/2020 02:59 PM   Modules accepted: Orders

## 2020-09-05 LAB — COMPREHENSIVE METABOLIC PANEL
ALT: 10 U/L (ref 0–35)
AST: 16 U/L (ref 0–37)
Albumin: 4.6 g/dL (ref 3.5–5.2)
Alkaline Phosphatase: 91 U/L (ref 39–117)
BUN: 9 mg/dL (ref 6–23)
CO2: 29 mEq/L (ref 19–32)
Calcium: 10.2 mg/dL (ref 8.4–10.5)
Chloride: 103 mEq/L (ref 96–112)
Creatinine, Ser: 0.55 mg/dL (ref 0.40–1.20)
GFR: 91.59 mL/min (ref >60.00)
Glucose, Bld: 88 mg/dL (ref 70–99)
Potassium: 4.5 mEq/L (ref 3.5–5.1)
Sodium: 139 mEq/L (ref 135–145)
Total Bilirubin: 0.4 mg/dL (ref 0.2–1.2)
Total Protein: 8 g/dL (ref 6.0–8.3)

## 2020-09-05 LAB — HEMOGLOBIN A1C: Hgb A1c MFr Bld: 5.1 % (ref 4.6–6.5)

## 2020-09-05 LAB — MAGNESIUM: Magnesium: 2.2 mg/dL (ref 1.5–2.5)

## 2020-09-05 LAB — VITAMIN B12: Vitamin B-12: 744 pg/mL (ref 211–911)

## 2020-09-11 DIAGNOSIS — Z471 Aftercare following joint replacement surgery: Secondary | ICD-10-CM | POA: Diagnosis not present

## 2020-09-11 DIAGNOSIS — Z9181 History of falling: Secondary | ICD-10-CM | POA: Diagnosis not present

## 2020-09-11 DIAGNOSIS — R531 Weakness: Secondary | ICD-10-CM | POA: Diagnosis not present

## 2020-09-11 DIAGNOSIS — Z96642 Presence of left artificial hip joint: Secondary | ICD-10-CM | POA: Diagnosis not present

## 2020-09-11 DIAGNOSIS — Z7409 Other reduced mobility: Secondary | ICD-10-CM | POA: Diagnosis not present

## 2020-09-11 DIAGNOSIS — M1612 Unilateral primary osteoarthritis, left hip: Secondary | ICD-10-CM | POA: Diagnosis not present

## 2020-09-11 LAB — VITAMIN B1: Vitamin B1 (Thiamine): 12 nmol/L (ref 8–30)

## 2020-09-14 DIAGNOSIS — Z96642 Presence of left artificial hip joint: Secondary | ICD-10-CM | POA: Diagnosis not present

## 2020-09-14 DIAGNOSIS — M1612 Unilateral primary osteoarthritis, left hip: Secondary | ICD-10-CM | POA: Diagnosis not present

## 2020-09-14 DIAGNOSIS — Z7409 Other reduced mobility: Secondary | ICD-10-CM | POA: Diagnosis not present

## 2020-09-14 DIAGNOSIS — R29898 Other symptoms and signs involving the musculoskeletal system: Secondary | ICD-10-CM | POA: Diagnosis not present

## 2020-09-20 DIAGNOSIS — Z96642 Presence of left artificial hip joint: Secondary | ICD-10-CM | POA: Diagnosis not present

## 2020-09-20 DIAGNOSIS — R29898 Other symptoms and signs involving the musculoskeletal system: Secondary | ICD-10-CM | POA: Diagnosis not present

## 2020-09-20 DIAGNOSIS — Z7409 Other reduced mobility: Secondary | ICD-10-CM | POA: Diagnosis not present

## 2020-09-20 DIAGNOSIS — M1612 Unilateral primary osteoarthritis, left hip: Secondary | ICD-10-CM | POA: Diagnosis not present

## 2020-09-25 DIAGNOSIS — M1612 Unilateral primary osteoarthritis, left hip: Secondary | ICD-10-CM | POA: Diagnosis not present

## 2020-09-25 DIAGNOSIS — Z7409 Other reduced mobility: Secondary | ICD-10-CM | POA: Diagnosis not present

## 2020-09-25 DIAGNOSIS — Z96642 Presence of left artificial hip joint: Secondary | ICD-10-CM | POA: Diagnosis not present

## 2020-09-25 DIAGNOSIS — R29898 Other symptoms and signs involving the musculoskeletal system: Secondary | ICD-10-CM | POA: Diagnosis not present

## 2020-09-26 DIAGNOSIS — B351 Tinea unguium: Secondary | ICD-10-CM | POA: Diagnosis not present

## 2020-09-26 DIAGNOSIS — M2041 Other hammer toe(s) (acquired), right foot: Secondary | ICD-10-CM | POA: Diagnosis not present

## 2020-09-26 DIAGNOSIS — M7741 Metatarsalgia, right foot: Secondary | ICD-10-CM | POA: Diagnosis not present

## 2020-09-26 DIAGNOSIS — M2042 Other hammer toe(s) (acquired), left foot: Secondary | ICD-10-CM | POA: Diagnosis not present

## 2020-09-28 DIAGNOSIS — Z96642 Presence of left artificial hip joint: Secondary | ICD-10-CM | POA: Diagnosis not present

## 2020-09-28 DIAGNOSIS — M1612 Unilateral primary osteoarthritis, left hip: Secondary | ICD-10-CM | POA: Diagnosis not present

## 2020-09-28 DIAGNOSIS — H524 Presbyopia: Secondary | ICD-10-CM | POA: Diagnosis not present

## 2020-09-28 DIAGNOSIS — H5202 Hypermetropia, left eye: Secondary | ICD-10-CM | POA: Diagnosis not present

## 2020-09-28 DIAGNOSIS — H52221 Regular astigmatism, right eye: Secondary | ICD-10-CM | POA: Diagnosis not present

## 2020-09-28 DIAGNOSIS — R29898 Other symptoms and signs involving the musculoskeletal system: Secondary | ICD-10-CM | POA: Diagnosis not present

## 2020-09-28 DIAGNOSIS — H52222 Regular astigmatism, left eye: Secondary | ICD-10-CM | POA: Diagnosis not present

## 2020-09-28 DIAGNOSIS — Z7409 Other reduced mobility: Secondary | ICD-10-CM | POA: Diagnosis not present

## 2020-09-28 DIAGNOSIS — H5211 Myopia, right eye: Secondary | ICD-10-CM | POA: Diagnosis not present

## 2020-09-29 DIAGNOSIS — M25561 Pain in right knee: Secondary | ICD-10-CM | POA: Diagnosis not present

## 2020-09-29 DIAGNOSIS — G8929 Other chronic pain: Secondary | ICD-10-CM | POA: Diagnosis not present

## 2020-09-29 DIAGNOSIS — M1711 Unilateral primary osteoarthritis, right knee: Secondary | ICD-10-CM | POA: Diagnosis not present

## 2020-10-02 DIAGNOSIS — R29898 Other symptoms and signs involving the musculoskeletal system: Secondary | ICD-10-CM | POA: Diagnosis not present

## 2020-10-02 DIAGNOSIS — Z7409 Other reduced mobility: Secondary | ICD-10-CM | POA: Diagnosis not present

## 2020-10-02 DIAGNOSIS — Z96642 Presence of left artificial hip joint: Secondary | ICD-10-CM | POA: Diagnosis not present

## 2020-10-02 DIAGNOSIS — M1612 Unilateral primary osteoarthritis, left hip: Secondary | ICD-10-CM | POA: Diagnosis not present

## 2020-10-04 DIAGNOSIS — M19012 Primary osteoarthritis, left shoulder: Secondary | ICD-10-CM | POA: Diagnosis not present

## 2020-10-04 DIAGNOSIS — M75102 Unspecified rotator cuff tear or rupture of left shoulder, not specified as traumatic: Secondary | ICD-10-CM | POA: Diagnosis not present

## 2020-10-05 DIAGNOSIS — M1612 Unilateral primary osteoarthritis, left hip: Secondary | ICD-10-CM | POA: Diagnosis not present

## 2020-10-05 DIAGNOSIS — Z96642 Presence of left artificial hip joint: Secondary | ICD-10-CM | POA: Diagnosis not present

## 2020-10-05 DIAGNOSIS — Z7409 Other reduced mobility: Secondary | ICD-10-CM | POA: Diagnosis not present

## 2020-10-05 DIAGNOSIS — R29898 Other symptoms and signs involving the musculoskeletal system: Secondary | ICD-10-CM | POA: Diagnosis not present

## 2020-10-09 ENCOUNTER — Telehealth: Payer: Self-pay | Admitting: Family Medicine

## 2020-10-09 DIAGNOSIS — Z713 Dietary counseling and surveillance: Secondary | ICD-10-CM | POA: Diagnosis not present

## 2020-10-09 MED ORDER — HYDROCODONE-ACETAMINOPHEN 5-325 MG PO TABS: ORAL_TABLET | ORAL | 0 refills | Status: DC

## 2020-10-09 NOTE — Telephone Encounter (Addendum)
Melanie Lee  Contract: 9/21 UDS: n/a Last Visit:11//2021 Next Visit:n/a Last Refill:07/2020  Please Advise  Rx norco refill sent to pt pharmacy. Please get pt follow up appointment with Dr. Abner Greenspan in next 1-2 months.

## 2020-10-09 NOTE — Telephone Encounter (Signed)
Medication: HYDROcodone-acetaminophen (NORCO) 5-325 MG tablet [[id number]]  Has the patient contacted their pharmacy? No. (If no, request that the patient contact the pharmacy for the refill.) (If yes, when and what did the pharmacy advise?)  Preferred Pharmacy (with phone number or street name):  [facility] [place], [place] - [address]  [address] [place] [address]  Phone:  [phone] Fax:  [phone]  DEA #:  --  DAW Reason: --    Agent: Please be advised that RX refills may take up to 3 business days. We ask that you follow-up with your pharmacy.

## 2020-10-10 DIAGNOSIS — M1612 Unilateral primary osteoarthritis, left hip: Secondary | ICD-10-CM | POA: Diagnosis not present

## 2020-10-10 DIAGNOSIS — Z96642 Presence of left artificial hip joint: Secondary | ICD-10-CM | POA: Diagnosis not present

## 2020-10-10 DIAGNOSIS — R29898 Other symptoms and signs involving the musculoskeletal system: Secondary | ICD-10-CM | POA: Diagnosis not present

## 2020-10-10 DIAGNOSIS — Z7409 Other reduced mobility: Secondary | ICD-10-CM | POA: Diagnosis not present

## 2020-10-10 DIAGNOSIS — Z471 Aftercare following joint replacement surgery: Secondary | ICD-10-CM | POA: Diagnosis not present

## 2020-10-12 DIAGNOSIS — N393 Stress incontinence (female) (male): Secondary | ICD-10-CM | POA: Diagnosis not present

## 2020-10-14 HISTORY — PX: OTHER SURGICAL HISTORY: SHX169

## 2020-10-18 DIAGNOSIS — R3129 Other microscopic hematuria: Secondary | ICD-10-CM | POA: Diagnosis not present

## 2020-10-18 DIAGNOSIS — N393 Stress incontinence (female) (male): Secondary | ICD-10-CM | POA: Diagnosis not present

## 2020-10-20 ENCOUNTER — Ambulatory Visit (INDEPENDENT_AMBULATORY_CARE_PROVIDER_SITE_OTHER): Payer: BC Managed Care – PPO | Admitting: Medical

## 2020-10-20 ENCOUNTER — Telehealth: Payer: Self-pay | Admitting: Family Medicine

## 2020-10-20 ENCOUNTER — Telehealth: Payer: Self-pay | Admitting: Medical

## 2020-10-20 ENCOUNTER — Other Ambulatory Visit: Payer: Self-pay

## 2020-10-20 VITALS — BP 128/74 | HR 78 | Temp 97.6°F | Resp 16 | Ht 63.0 in | Wt 150.0 lb

## 2020-10-20 DIAGNOSIS — R918 Other nonspecific abnormal finding of lung field: Secondary | ICD-10-CM

## 2020-10-20 DIAGNOSIS — E785 Hyperlipidemia, unspecified: Secondary | ICD-10-CM | POA: Diagnosis not present

## 2020-10-20 DIAGNOSIS — R7989 Other specified abnormal findings of blood chemistry: Secondary | ICD-10-CM | POA: Diagnosis not present

## 2020-10-20 DIAGNOSIS — M25512 Pain in left shoulder: Secondary | ICD-10-CM

## 2020-10-20 DIAGNOSIS — R739 Hyperglycemia, unspecified: Secondary | ICD-10-CM | POA: Diagnosis not present

## 2020-10-20 DIAGNOSIS — R911 Solitary pulmonary nodule: Secondary | ICD-10-CM

## 2020-10-20 LAB — CBC WITH DIFFERENTIAL/PLATELET
Basophils Absolute: 0.1 10*3/uL (ref 0.0–0.1)
Basophils Relative: 0.9 % (ref 0.0–3.0)
Eosinophils Absolute: 0.1 10*3/uL (ref 0.0–0.7)
Eosinophils Relative: 0.8 % (ref 0.0–5.0)
HCT: 39.1 % (ref 36.0–46.0)
Hemoglobin: 12.9 g/dL (ref 12.0–15.0)
Lymphocytes Relative: 19.6 % (ref 12.0–46.0)
Lymphs Abs: 2.1 10*3/uL (ref 0.7–4.0)
MCHC: 33 g/dL (ref 30.0–36.0)
MCV: 87.9 fl (ref 78.0–100.0)
Monocytes Absolute: 0.7 10*3/uL (ref 0.1–1.0)
Monocytes Relative: 6.1 % (ref 3.0–12.0)
Neutro Abs: 7.8 10*3/uL — ABNORMAL HIGH (ref 1.4–7.7)
Neutrophils Relative %: 72.6 % (ref 43.0–77.0)
Platelets: 416 10*3/uL — ABNORMAL HIGH (ref 150.0–400.0)
RBC: 4.45 Mil/uL (ref 3.87–5.11)
RDW: 12.9 % (ref 11.5–15.5)
WBC: 10.7 10*3/uL — ABNORMAL HIGH (ref 4.0–10.5)

## 2020-10-20 LAB — LIPID PANEL
Cholesterol: 211 mg/dL — ABNORMAL HIGH (ref 0–200)
HDL: 64.7 mg/dL (ref >39.00)
LDL Cholesterol: 125 mg/dL — ABNORMAL HIGH (ref 0–99)
NonHDL: 146.48
Total CHOL/HDL Ratio: 3
Triglycerides: 108 mg/dL (ref 0.0–149.0)
VLDL: 21.6 mg/dL (ref 0.0–40.0)

## 2020-10-20 LAB — COMPREHENSIVE METABOLIC PANEL
ALT: 10 U/L (ref 0–35)
AST: 21 U/L (ref 0–37)
Albumin: 4.6 g/dL (ref 3.5–5.2)
Alkaline Phosphatase: 74 U/L (ref 39–117)
BUN: 14 mg/dL (ref 6–23)
CO2: 25 mEq/L (ref 19–32)
Calcium: 9.6 mg/dL (ref 8.4–10.5)
Chloride: 106 mEq/L (ref 96–112)
Creatinine, Ser: 0.56 mg/dL (ref 0.40–1.20)
GFR: 91.12 mL/min (ref >60.00)
Glucose, Bld: 81 mg/dL (ref 70–99)
Potassium: 4 mEq/L (ref 3.5–5.1)
Sodium: 139 mEq/L (ref 135–145)
Total Bilirubin: 0.4 mg/dL (ref 0.2–1.2)
Total Protein: 7.7 g/dL (ref 6.0–8.3)

## 2020-10-20 MED ORDER — HYDROCODONE-ACETAMINOPHEN 5-325 MG PO TABS
1.0000 | ORAL_TABLET | Freq: Four times a day (QID) | ORAL | 0 refills | Status: DC | PRN
Start: 2020-10-20 — End: 2020-11-17

## 2020-10-20 NOTE — Telephone Encounter (Signed)
Does pt need to change her medication regimen?

## 2020-10-20 NOTE — Telephone Encounter (Signed)
Patient seen Mackie Pai PA on 10/20/2020. Patient states that she is having all her teeth pulled on 10/25/2020 and would and like to know how to take her medications during this time.  Please Advise

## 2020-10-20 NOTE — Progress Notes (Signed)
Subjective:    Patient ID: Melanie Lee, female    DOB: [birth year], 75 y.o.   MRN: [id number]  HPI  Pt in for follow up for chronic medical problems.  Pt has hx of high cholesterol. She is on pravachol. Pt is fasting  Hx of low vit D.  Mild elevated sugar in past. Normal A1c.  Pt has seen ortho for left  hip replacement. Pt states her hip still hurts when walking. Pain is less. She states needs rt knee replaced. Also she has subacromial bursitis. Pain in shoulder despite injections from orthopedist.  Pt has been on norco for pain. She request increase to 7.5/325.  Pt states no drowsiness or balance issues with pain meds. Also discussed using 5-325 mg with higher number. She has been using at time up to 3 a day after her bladder surgery.   Pt can't get knee replacement until March.        Review of Systems  Constitutional: Negative for chills, fatigue and fever.  HENT: Negative for congestion, ear discharge, ear pain and tinnitus.   Respiratory: Negative for cough, chest tightness, shortness of breath and wheezing.   Cardiovascular: Negative for chest pain and palpitations.  Gastrointestinal: Negative for abdominal pain.  Endocrine: Negative for polydipsia, polyphagia and polyuria.  Genitourinary: Negative for decreased urine volume and dysuria.  Musculoskeletal:       See hpi  Neurological: Negative for dizziness, speech difficulty, weakness and light-headedness.  Hematological: Negative for adenopathy. Does not bruise/bleed easily.  Psychiatric/Behavioral: Negative for behavioral problems and decreased concentration.    Past Medical History:  Diagnosis Date  . Elevated blood pressure (not hypertension)    "elevates sometimes with MD visits"  . Frequent headaches    04-19-16 "no longer bothered"- since no cholesterol med   . H/O measles   . 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  Marital status: Single    Spouse name: Not on file  . Number of children: Not on file  . Years of education: Not on file  . Highest education level: Not on file  Occupational History  . Not on file  Tobacco Use  . Smoking status: Never Smoker  . Smokeless tobacco: Never Used  Vaping Use  . Vaping Use: Never used  Substance and Sexual Activity  . Alcohol use: Yes    Alcohol/week: 0.0 standard drinks    Comment: rare  . Drug use: No  . Sexual activity: Not Currently  Other Topics Concern  . 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   Social Determinants of Health   Financial Resource Strain: Not on file  Food Insecurity: Not on file  Transportation Needs: Not on file  Physical Activity: Not on file  Stress: Not on file  Social Connections: Not on file  Intimate Partner Violence: Not on file    Past Surgical History:  Procedure Laterality Date  . ABDOMINAL HYSTERECTOMY  1995   total for fibroids  . BREAST BIOPSY  1995   benign  . CATARACT EXTRACTION  2013 & 2015   Bilateral  . KNEE SURGERY     Left Arthroscopy  . 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/2016   Procedure: TOTAL KNEE ARTHROPLASTY;  Surgeon: Latanya Maudlin, MD;  Location: [facility];  Service: Orthopedics;  Laterality: Left;    Family History  Problem Relation Age of  Onset  . Breast cancer Mother 34       Deceased  . Heart disease Mother   . Hypertension Mother   . Hypertension Father        Deceased  . Stroke Father   . Hypertension Sister   . Hypertension Brother   . Heart defect Daughter   . Hyperlipidemia Daughter   . Hypertension Daughter   . Vision loss Paternal Uncle   . Stroke Brother     Allergies  Allergen Reactions  . Tramadol Nausea Only and Other (See Comments)    Dizziness    Current Outpatient Medications on File Prior to Visit  Medication Sig Dispense Refill  . fluticasone (FLONASE) 50 MCG/ACT nasal spray Place 2 sprays into both nostrils daily. 16 g 1   . HYDROcodone-acetaminophen (NORCO) 5-325 MG tablet 1 tab po bid prn severe pain 30 tablet 0  . meloxicam (MOBIC) 7.5 MG tablet Take by mouth.    . methylPREDNISolone (MEDROL) 4 MG tablet Standard 6 day taper 21 tablet 0  . Multiple Vitamins-Minerals (MULTIVITAMIN ADULTS 50+) TABS Take 1 tablet by mouth daily.     . pravastatin (PRAVACHOL) 20 MG tablet Take 1 tablet (20 mg total) by mouth at bedtime. 90 tablet 3  . predniSONE (STERAPRED UNI-PAK 21 TAB) 10 MG (21) TBPK tablet Standard taper over 6 days. 21 tablet 0  . pregabalin (LYRICA) 25 MG capsule Take 1 capsule by mouth at bedtime.    . Vibegron (GEMTESA PO) Take by mouth.    . Vitamin D, Ergocalciferol, (DRISDOL) 1.25 MG (50000 UNIT) CAPS capsule Take 1 capsule (50,000 Units total) by mouth every 7 (seven) days. 12 capsule 1   No current facility-administered medications on file prior to visit.    BP 128/74   Pulse 78   Temp 97.6 F (36.4 C) (Oral)   Resp 16   Ht 5\' 3"  (1.6 m)   Wt 150 lb (68 kg)   SpO2 98%   BMI 26.57 kg/m       Objective:   Physical Exam   General Mental Status- Alert. General Appearance- Not in acute distress.   Skin General: Color- Normal Color. Moisture- Normal Moisture.  Neck Carotid Arteries- Normal color. Moisture- Normal Moisture. No carotid bruits. No JVD.  Chest and Lung Exam Auscultation: Breath Sounds:-Normal.  Cardiovascular Auscultation:Rythm- Regular. Murmurs & Other Heart Sounds:Auscultation of the heart reveals- No Murmurs.  Abdomen Inspection:-Inspeection Normal. Palpation/Percussion:Note:No mass. Palpation and Percussion of the abdomen reveal- Non Tender, Non Distended + BS, no rebound or guarding.   Neurologic Cranial Nerve exam:- CN III-XII intact(No nystagmus), symmetric smile. Strength:- 5/5 equal and symmetric strength both upper and lower extremities.   Assessment & Plan:  History of high cholesterol.  We will get metabolic panel and lipid panel today.   Continue pravastatin and we will see if we need to make changes to dosing.  History of elevated sugar in the past but recent A1c less than 3 months ago showed sugar average is not in the diabetic range.  Continue to eat low sugar diet.  History of left subacromial bursitis, right knee osteoarthritis and persisting left hip pain despite hip replacement.  Will prescribe refill of Norco 5/325.  I am going to give 45tablets on this next refill.  Try to make that a prescription last 4 months.  History of lung nodule found incidentally on CT of abdomen.  Placed order for CT chest without contrast.  We will try to get that scheduled sometime  after/close to November 26, 2020  Including CBC labs as he had platelet elevation on last CBC check.  Follow-up date to be determined after lab review.  Mackie Pai, PA-C   Time spent with patient today was  40 minutes which consisted of chart reveiew, discussing diagnosis, work up treatment and documentation.

## 2020-10-20 NOTE — Patient Instructions (Addendum)
History of high cholesterol.  We will get metabolic panel and lipid panel today.  Continue pravastatin and we will see if we need to make changes to dosing.  History of elevated sugar in the past but recent A1c less than 3 months ago showed sugar average is not in the diabetic range.  Continue to eat low sugar diet.  History of left subacromial bursitis, right knee osteoarthritis and persisting left hip pain despite hip replacement.  Will prescribe refill of Norco 5/325.  I am going to give 45 tablets on this next refill.  Try to make that a prescription last 4 months.  History of lung nodule found incidentally on CT of abdomen.  Placed order for CT chest without contrast.  We will try to get that scheduled sometime after/close to November 26, 2020  Including CBC labs as he had platelet elevation on last CBC check.  Follow-up date to be determined after lab review.

## 2020-10-20 NOTE — Telephone Encounter (Signed)
Ct chest order placed. Can you get prior auth. Radiologist stated to get about one year from 11/27/19. So placed order. Let me know if approved

## 2020-10-21 ENCOUNTER — Telehealth: Payer: Self-pay | Admitting: Medical

## 2020-10-21 MED ORDER — ROSUVASTATIN CALCIUM 10 MG PO TABS: 10.0000 mg | ORAL_TABLET | Freq: Every day | ORAL | 1 refills | Status: DC

## 2020-10-21 NOTE — Telephone Encounter (Signed)
Rx crestor sent to pt pharmacy. 

## 2020-10-21 NOTE — Telephone Encounter (Signed)
I don't see need for pt to make changes due to teeth pulling. The way she is taking norcofor shouder, hip and knee pain should cover any post dental procedure pain.

## 2020-10-23 NOTE — Telephone Encounter (Signed)
No auth required [facility] sent to [facility]

## 2020-10-23 NOTE — Telephone Encounter (Signed)
Notify pt and daughter no prior auth required needed for CT chest. Call [facility] after 10/26/2020 and get scheduled for study to be done week of 11/26/2020.

## 2020-10-24 DIAGNOSIS — N393 Stress incontinence (female) (male): Secondary | ICD-10-CM | POA: Diagnosis not present

## 2020-10-24 NOTE — Telephone Encounter (Signed)
Pt is aware.  

## 2020-10-26 DIAGNOSIS — M75102 Unspecified rotator cuff tear or rupture of left shoulder, not specified as traumatic: Secondary | ICD-10-CM | POA: Diagnosis not present

## 2020-10-26 DIAGNOSIS — Z7409 Other reduced mobility: Secondary | ICD-10-CM | POA: Diagnosis not present

## 2020-10-26 DIAGNOSIS — M1612 Unilateral primary osteoarthritis, left hip: Secondary | ICD-10-CM | POA: Diagnosis not present

## 2020-10-27 DIAGNOSIS — Z7409 Other reduced mobility: Secondary | ICD-10-CM | POA: Diagnosis not present

## 2020-10-27 DIAGNOSIS — M1612 Unilateral primary osteoarthritis, left hip: Secondary | ICD-10-CM | POA: Diagnosis not present

## 2020-10-27 DIAGNOSIS — M75102 Unspecified rotator cuff tear or rupture of left shoulder, not specified as traumatic: Secondary | ICD-10-CM | POA: Diagnosis not present

## 2020-10-31 ENCOUNTER — Other Ambulatory Visit: Payer: Self-pay

## 2020-10-31 ENCOUNTER — Ambulatory Visit (HOSPITAL_BASED_OUTPATIENT_CLINIC_OR_DEPARTMENT_OTHER)
Admission: RE | Admit: 2020-10-31 | Discharge: 2020-10-31 | Disposition: A | Discharge status: Home / Self Care | Payer: BC Managed Care – PPO | Source: Ambulatory Visit | Attending: Medical | Admitting: Medical

## 2020-10-31 DIAGNOSIS — R918 Other nonspecific abnormal finding of lung field: Secondary | ICD-10-CM | POA: Diagnosis not present

## 2020-10-31 DIAGNOSIS — J9811 Atelectasis: Secondary | ICD-10-CM | POA: Diagnosis not present

## 2020-10-31 DIAGNOSIS — Z7409 Other reduced mobility: Secondary | ICD-10-CM | POA: Diagnosis not present

## 2020-10-31 DIAGNOSIS — M1612 Unilateral primary osteoarthritis, left hip: Secondary | ICD-10-CM | POA: Diagnosis not present

## 2020-10-31 DIAGNOSIS — I700 Atherosclerosis of aorta: Secondary | ICD-10-CM | POA: Diagnosis not present

## 2020-10-31 DIAGNOSIS — M75102 Unspecified rotator cuff tear or rupture of left shoulder, not specified as traumatic: Secondary | ICD-10-CM | POA: Diagnosis not present

## 2020-10-31 DIAGNOSIS — M5134 Other intervertebral disc degeneration, thoracic region: Secondary | ICD-10-CM | POA: Diagnosis not present

## 2020-10-31 DIAGNOSIS — I712 Thoracic aortic aneurysm, without rupture: Secondary | ICD-10-CM | POA: Diagnosis not present

## 2020-11-01 DIAGNOSIS — M19012 Primary osteoarthritis, left shoulder: Secondary | ICD-10-CM | POA: Diagnosis not present

## 2020-11-01 DIAGNOSIS — M75102 Unspecified rotator cuff tear or rupture of left shoulder, not specified as traumatic: Secondary | ICD-10-CM | POA: Diagnosis not present

## 2020-11-01 DIAGNOSIS — M7552 Bursitis of left shoulder: Secondary | ICD-10-CM | POA: Diagnosis not present

## 2020-11-02 DIAGNOSIS — M75102 Unspecified rotator cuff tear or rupture of left shoulder, not specified as traumatic: Secondary | ICD-10-CM | POA: Diagnosis not present

## 2020-11-02 DIAGNOSIS — Z7409 Other reduced mobility: Secondary | ICD-10-CM | POA: Diagnosis not present

## 2020-11-02 DIAGNOSIS — M1612 Unilateral primary osteoarthritis, left hip: Secondary | ICD-10-CM | POA: Diagnosis not present

## 2020-11-09 DIAGNOSIS — M1612 Unilateral primary osteoarthritis, left hip: Secondary | ICD-10-CM | POA: Diagnosis not present

## 2020-11-09 DIAGNOSIS — M75102 Unspecified rotator cuff tear or rupture of left shoulder, not specified as traumatic: Secondary | ICD-10-CM | POA: Diagnosis not present

## 2020-11-09 DIAGNOSIS — Z7409 Other reduced mobility: Secondary | ICD-10-CM | POA: Diagnosis not present

## 2020-11-13 DIAGNOSIS — R928 Other abnormal and inconclusive findings on diagnostic imaging of breast: Secondary | ICD-10-CM | POA: Diagnosis not present

## 2020-11-13 DIAGNOSIS — R922 Inconclusive mammogram: Secondary | ICD-10-CM | POA: Diagnosis not present

## 2020-11-13 DIAGNOSIS — R921 Mammographic calcification found on diagnostic imaging of breast: Secondary | ICD-10-CM | POA: Diagnosis not present

## 2020-11-13 LAB — HM MAMMOGRAPHY

## 2020-11-17 ENCOUNTER — Other Ambulatory Visit: Payer: Self-pay

## 2020-11-17 ENCOUNTER — Ambulatory Visit (INDEPENDENT_AMBULATORY_CARE_PROVIDER_SITE_OTHER): Payer: BC Managed Care – PPO | Admitting: Medical

## 2020-11-17 VITALS — BP 124/67 | HR 80 | Temp 98.7°F | Resp 18 | Ht 63.0 in | Wt 148.0 lb

## 2020-11-17 DIAGNOSIS — D649 Anemia, unspecified: Secondary | ICD-10-CM | POA: Diagnosis not present

## 2020-11-17 DIAGNOSIS — M25552 Pain in left hip: Secondary | ICD-10-CM | POA: Diagnosis not present

## 2020-11-17 DIAGNOSIS — M170 Bilateral primary osteoarthritis of knee: Secondary | ICD-10-CM

## 2020-11-17 DIAGNOSIS — E785 Hyperlipidemia, unspecified: Secondary | ICD-10-CM | POA: Diagnosis not present

## 2020-11-17 MED ORDER — HYDROCODONE-ACETAMINOPHEN 5-325 MG PO TABS: 1.0000 | ORAL_TABLET | Freq: Four times a day (QID) | ORAL | 0 refills | Status: DC | PRN

## 2020-11-17 NOTE — Progress Notes (Signed)
Subjective:    Patient ID: Melanie Lee, female    DOB: 1945/12/09, 75 y.o.   MRN: [id number]  HPI  Pt in for some rt hip pain.  Pt has hx of lt hip replacement in october. She is still having pain. Pt is now planning to have rt knee replacement. The appointment with new orthopedist is on Nov 27, 2020.  Pt is running out of her last 45 tab prescription norco. She is up to date on contract and uds.  Hx of elevated cholesterol. On last lab review stopped pravachol and started crestor.  Hx of anemia. Mild decrease on last lab. Hb just below 11.   Review of Systems  Constitutional: Negative for chills, fatigue and fever.  HENT: Negative for congestion.   Respiratory: Negative for cough, chest tightness, shortness of breath and wheezing.   Cardiovascular: Negative for chest pain and palpitations.  Gastrointestinal: Negative for abdominal pain and blood in stool.  Musculoskeletal:       Rt knee pain.  Skin: Negative for rash.  Neurological: Negative for dizziness, weakness, numbness and headaches.  Hematological: Negative for adenopathy. Does not bruise/bleed easily.  Psychiatric/Behavioral: Negative for agitation, confusion, decreased concentration and dysphoric mood.     Past Medical History:  Diagnosis Date  . Elevated blood pressure (not hypertension)    "elevates sometimes with MD visits"  . Frequent headaches    04-19-16 "no longer bothered"- since no cholesterol med   . H/O measles   . 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 Marital status: Single    Spouse name: Not on file  . Number of children: Not on file  . Years of education: Not on file  . Highest education level: Not on file  Occupational History  . Not on file  Tobacco Use  . Smoking status: Never Smoker  . Smokeless tobacco: Never Used  Vaping Use  . Vaping Use: Never used  Substance and Sexual Activity  . Alcohol  use: Yes    Alcohol/week: 0.0 standard drinks    Comment: rare  . Drug use: No  . Sexual activity: Not Currently  Other Topics Concern  . 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   Social Determinants of Health   Financial Resource Strain: Not on file  Food Insecurity: Not on file  Transportation Needs: Not on file  Physical Activity: Not on file  Stress: Not on file  Social Connections: Not on file  Intimate Partner Violence: Not on file    Past Surgical History:  Procedure Laterality Date  . ABDOMINAL HYSTERECTOMY  1995   total for fibroids  . BREAST BIOPSY  1995   benign  . CATARACT EXTRACTION  2013 & 2015   Bilateral  . KNEE SURGERY     Left Arthroscopy  . 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 Breast cancer Mother 70       Deceased  . Heart disease Mother   . Hypertension Mother   . Hypertension Father        Deceased  . Stroke Father   . Hypertension Sister   . Hypertension Brother   . Heart defect Daughter   .  Hyperlipidemia Daughter   . Hypertension Daughter   . Vision loss Paternal Uncle   . Stroke Brother     Allergies  Allergen Reactions  . Tramadol Nausea Only and Other (See Comments)    Dizziness    Current Outpatient Medications on File Prior to Visit  Medication Sig Dispense Refill  . fluticasone (FLONASE) 50 MCG/ACT nasal spray Place 2 sprays into both nostrils daily. 16 g 1  . HYDROcodone-acetaminophen (NORCO) 5-325 MG tablet Take 1 tablet by mouth every 6 (six) hours as needed for moderate pain. 45 tablet 0  . meloxicam (MOBIC) 7.5 MG tablet Take by mouth.    . methylPREDNISolone (MEDROL) 4 MG tablet Standard 6 day taper 21 tablet 0  . Multiple Vitamins-Minerals (MULTIVITAMIN ADULTS 50+) TABS Take 1 tablet by mouth daily.     .  predniSONE (STERAPRED UNI-PAK 21 TAB) 10 MG (21) TBPK tablet Standard taper over 6 days. 21 tablet 0  . pregabalin (LYRICA) 25 MG capsule Take 1 capsule by mouth at bedtime.    . rosuvastatin (CRESTOR) 10 MG tablet Take 1 tablet (10 mg total) by mouth daily. Dc pravastatin if crestor covered/filled. 90 tablet 1  . Vibegron (GEMTESA PO) Take by mouth.    . Vitamin D, Ergocalciferol, (DRISDOL) 1.25 MG (50000 UNIT) CAPS capsule Take 1 capsule (50,000 Units total) by mouth every 7 (seven) days. 12 capsule 1   No current facility-administered medications on file prior to visit.    BP 124/67   Pulse 80   Temp 98.7 F (37.1 C) (Oral)   Resp 18   Ht 5\' 3"  (1.6 m)   Wt 148 lb (67.1 kg)   SpO2 97%   BMI 26.22 kg/m     Objective:   Physical Exam  General Mental Status- Alert. General Appearance- Not in acute distress.   Skin General: Color- Normal Color. Moisture- Normal Moisture.  Neck Carotid Arteries- Normal color. Moisture- Normal Moisture. No carotid bruits. No JVD.  Chest and Lung Exam Auscultation: Breath Sounds:-Normal.  Cardiovascular Auscultation:Rythm- Regular. Murmurs & Other Heart Sounds:Auscultation of the heart reveals- No Murmurs.  Abdomen Inspection:-Inspeection Normal. Palpation/Percussion:Note:No mass. Palpation and Percussion of the abdomen reveal- Non Tender, Non Distended + BS, no rebound or guarding.   Neurologic Cranial Nerve exam:- CN III-XII intact(No nystagmus), symmetric smile. Strength:- 5/5 equal and symmetric strength both upper and lower extremities.  Rt knee- pain on flexion and extension. Worse walking and standing.      Assessment & Plan:  For moderate-severe left hip and rt knee pain will continue norco 5/325. Have new orthopedist give opinion on left hip pain that persists despite recent surgery.  For anemia will get repeat cbc and add iron level.   For high cholesterol continue crestor.   Follow up date to be determined after  lab review.  Mackie Pai, PA-C

## 2020-11-17 NOTE — Patient Instructions (Addendum)
For moderate-severe left hip and rt knee pain will continue norco 5/325. Have new orthopedist give opinion on left hip pain that persists despite recent surgery.  For anemia will get repeat cbc and add iron level.   For high cholesterol continue crestor.   Follow up date to be determined after lab review.

## 2020-11-18 LAB — CBC WITH DIFFERENTIAL/PLATELET
Absolute Monocytes: 599 cells/uL (ref 200–950)
Basophils Absolute: 59 cells/uL (ref 0–200)
Basophils Relative: 0.8 %
Eosinophils Absolute: 67 cells/uL (ref 15–500)
Eosinophils Relative: 0.9 %
HCT: 40.3 % (ref 35.0–45.0)
Hemoglobin: 13.4 g/dL (ref 11.7–15.5)
Lymphs Abs: 1954 cells/uL (ref 850–3900)
MCH: 28.9 pg (ref 27.0–33.0)
MCHC: 33.3 g/dL (ref 32.0–36.0)
MCV: 86.9 fL (ref 80.0–100.0)
MPV: 9 fL (ref 7.5–12.5)
Monocytes Relative: 8.1 %
Neutro Abs: 4721 cells/uL (ref 1500–7800)
Neutrophils Relative %: 63.8 %
Platelets: 361 10*3/uL (ref 140–400)
RBC: 4.64 10*6/uL (ref 3.80–5.10)
RDW: 11.9 % (ref 11.0–15.0)
Total Lymphocyte: 26.4 %
WBC: 7.4 10*3/uL (ref 3.8–10.8)

## 2020-11-18 LAB — IRON: Iron: 85 ug/dL (ref 45–160)

## 2020-11-27 DIAGNOSIS — Z96642 Presence of left artificial hip joint: Secondary | ICD-10-CM | POA: Diagnosis not present

## 2020-11-27 DIAGNOSIS — M1711 Unilateral primary osteoarthritis, right knee: Secondary | ICD-10-CM | POA: Diagnosis not present

## 2020-12-08 NOTE — Patient Instructions (Addendum)
DUE TO COVID-19 ONLY ONE VISITOR IS ALLOWED TO COME WITH YOU AND STAY IN THE WAITING ROOM ONLY DURING PRE OP AND PROCEDURE DAY OF SURGERY. THE 1 VISITOR  MAY VISIT WITH YOU AFTER SURGERY IN YOUR PRIVATE ROOM DURING VISITING HOURS ONLY!  YOU NEED TO HAVE A COVID 19 TEST ON_3/11______ @_8 :30______, THIS TEST MUST BE DONE BEFORE SURGERY,  COVID TESTING SITE [place] [id number], IT IS ON THE RIGHT GOING OUT [address] APPROXIMATELY  2 MINUTES PAST [employer] ON THE RIGHT. ONCE YOUR COVID TEST IS COMPLETED,  PLEASE BEGIN THE QUARANTINE INSTRUCTIONS AS OUTLINED IN YOUR HANDOUT.                Tilla Wilborn    Your procedure is scheduled on: 12/26/20   Report to [facility] Main  Entrance   Report to Short stay at 5:30 AM     Call this number if you have problems the morning of surgery [place], NO CHEWING GUM [place].   No food after midnight.    You may have clear liquid until 4:30 AM.    At 4:00 AM drink pre surgery drink.   Nothing by mouth after 4:30 AM.   Take these medicines the morning of surgery with A SIP OF WATER: none                                You may not have any metal on your body including hair pins and              piercings  Do not wear jewelry, make-up, lotions, powders or perfumes, deodorant             Do not wear nail polish on your fingernails.  Do not shave  48 hours prior to surgery.                Do not bring valuables to the hospital. [place].  Contacts, dentures or bridgework may not be worn into surgery.                 Please read over the following fact sheets you were given: _____________________________________________________________________             [facility] - Preparing for Surgery Before surgery, you can play an important role.  Because skin is not sterile,  your skin needs to be as free of germs as possible.  You can reduce the number of germs on your skin by washing with CHG (chlorahexidine gluconate) soap before surgery.  CHG is an antiseptic cleaner which kills germs and bonds with the skin to continue killing germs even after washing. Please DO NOT use if you have an allergy to CHG or antibacterial soaps.  If your skin becomes reddened/irritated stop using the CHG and inform your nurse when you arrive at Short Stay. Do not shave (including legs and underarms) for at least 48 hours prior to the first CHG shower.    Please follow these instructions carefully:  1.  Shower with CHG Soap the night before surgery and the  morning of Surgery.  2.  If you choose to wash your hair, wash your hair first as usual  with your  normal  shampoo.  3.  After you shampoo, rinse your hair and body thoroughly to remove the  shampoo.                                        4.  Use CHG as you would any other liquid soap.  You can apply chg directly  to the skin and wash                       Gently with a scrungie or clean washcloth.  5.  Apply the CHG Soap to your body ONLY FROM THE NECK DOWN.   Do not use on face/ open                           Wound or open sores. Avoid contact with eyes, ears mouth and genitals (private parts).                       Wash face,  Genitals (private parts) with your normal soap.             6.  Wash thoroughly, paying special attention to the area where your surgery  will be performed.  7.  Thoroughly rinse your body with warm water from the neck down.  8.  DO NOT shower/wash with your normal soap after using and rinsing off  the CHG Soap.             9.  Pat yourself dry with a clean towel.            10.  Wear clean pajamas.            11.  Place clean sheets on your bed the night of your first shower and do not  sleep with pets. Day of Surgery : Do not apply any lotions/deodorants the morning of surgery.  Please wear clean clothes  to the hospital/surgery center.  FAILURE TO FOLLOW THESE INSTRUCTIONS MAY RESULT IN THE CANCELLATION OF YOUR SURGERY PATIENT SIGNATURE_________________________________  NURSE SIGNATURE__________________________________  ________________________________________________________________________   Adam Phenix  An incentive spirometer is a tool that can help keep your lungs clear and active. This tool measures how well you are filling your lungs with each breath. Taking long deep breaths may help reverse or decrease the chance of developing breathing (pulmonary) problems (especially infection) following:  A long period of time when you are unable to move or be active. BEFORE THE PROCEDURE   If the spirometer includes an indicator to show your best effort, your nurse or respiratory therapist will set it to a desired goal.  If possible, sit up straight or lean slightly forward. Try not to slouch.  Hold the incentive spirometer in an upright position. INSTRUCTIONS FOR USE  1. Sit on the edge of your bed if possible, or sit up as far as you can in bed or on a chair. 2. Hold the incentive spirometer in an upright position. 3. Breathe out normally. 4. Place the mouthpiece in your mouth and seal your lips tightly around it. 5. Breathe in slowly and as deeply as possible, raising the piston or the ball toward the top of the column. 6. Hold your breath for 3-5 seconds or for as long as possible. Allow the piston or ball to fall to the  bottom of the column. 7. Remove the mouthpiece from your mouth and breathe out normally. 8. Rest for a few seconds and repeat Steps 1 through 7 at least 10 times every 1-2 hours when you are awake. Take your time and take a few normal breaths between deep breaths. 9. The spirometer may include an indicator to show your best effort. Use the indicator as a goal to work toward during each repetition. 10. After each set of 10 deep breaths, practice coughing to be  sure your lungs are clear. If you have an incision (the cut made at the time of surgery), support your incision when coughing by placing a pillow or rolled up towels firmly against it. Once you are able to get out of bed, walk around indoors and cough well. You may stop using the incentive spirometer when instructed by your caregiver.  RISKS AND COMPLICATIONS  Take your time so you do not get dizzy or light-headed.  If you are in pain, you may need to take or ask for pain medication before doing incentive spirometry. It is harder to take a deep breath if you are having pain. AFTER USE  Rest and breathe slowly and easily.  It can be helpful to keep track of a log of your progress. Your caregiver can provide you with a simple table to help with this. If you are using the spirometer at home, follow these instructions: [place] IF:   You are having difficultly using the spirometer.  You have trouble using the spirometer as often as instructed.  Your pain medication is not giving enough relief while using the spirometer.  You develop fever of 100.5 F (38.1 C) or higher. SEEK IMMEDIATE MEDICAL CARE IF:   You cough up bloody sputum that had not been present before.  You develop fever of 102 F (38.9 C) or greater.  You develop worsening pain at or near the incision site. MAKE SURE YOU:   Understand these instructions.  Will watch your condition.  Will get help right away if you are not doing well or get worse. Document Released: 02/10/2007 Document Revised: 12/23/2011 Document Reviewed: 04/13/2007 [facility] Patient Information 2014 [place], [place].   ________________________________________________________________________

## 2020-12-13 ENCOUNTER — Encounter (HOSPITAL_COMMUNITY)
Admission: RE | Admit: 2020-12-13 | Discharge: 2020-12-13 | Disposition: A | Discharge status: Home / Self Care | Payer: BC Managed Care – PPO | Source: Ambulatory Visit | Attending: Orthopedic Surgery | Admitting: Orthopedic Surgery

## 2020-12-13 ENCOUNTER — Other Ambulatory Visit: Payer: Self-pay

## 2020-12-13 ENCOUNTER — Encounter (HOSPITAL_COMMUNITY): Payer: Self-pay

## 2020-12-13 DIAGNOSIS — Z01812 Encounter for preprocedural laboratory examination: Secondary | ICD-10-CM | POA: Insufficient documentation

## 2020-12-13 LAB — BASIC METABOLIC PANEL
Anion gap: 9 (ref 5–15)
BUN: 12 mg/dL (ref 8–23)
CO2: 24 mmol/L (ref 22–32)
Calcium: 9.8 mg/dL (ref 8.9–10.3)
Chloride: 107 mmol/L (ref 98–111)
Creatinine, Ser: 0.57 mg/dL (ref 0.44–1.00)
GFR, Estimated: >60 mL/min (ref >60)
Glucose, Bld: 99 mg/dL (ref 70–99)
Potassium: 3.8 mmol/L (ref 3.5–5.1)
Sodium: 140 mmol/L (ref 135–145)

## 2020-12-13 LAB — CBC
HCT: 44.3 % (ref 36.0–46.0)
Hemoglobin: 14.1 g/dL (ref 12.0–15.0)
MCH: 28.7 pg (ref 26.0–34.0)
MCHC: 31.8 g/dL (ref 30.0–36.0)
MCV: 90.2 fL (ref 80.0–100.0)
Platelets: 360 10*3/uL (ref 150–400)
RBC: 4.91 MIL/uL (ref 3.87–5.11)
RDW: 12.6 % (ref 11.5–15.5)
WBC: 7.2 10*3/uL (ref 4.0–10.5)
nRBC: 0 % (ref 0.0–0.2)

## 2020-12-13 LAB — TYPE AND SCREEN
ABO/RH(D): O POS
Antibody Screen: NEGATIVE

## 2020-12-13 LAB — SURGICAL PCR SCREEN
MRSA, PCR: NEGATIVE
Staphylococcus aureus: NEGATIVE

## 2020-12-13 NOTE — Progress Notes (Signed)
COVID Vaccine Completed:Yes Date COVID Vaccine completed:12/22/19- Booster 10/17/19 COVID vaccine manufacturer: Moderna     PCP - Dr. Frederik Pear Cardiologist - no  Chest x-ray -10/31/20-epic EKG - 08/14/20-epic Stress Test - no ECHO - no Cardiac Cath - no Pacemaker/ICD device last checked:NA  Sleep Study - no CPAP -   Fasting Blood Sugar -NA  Checks Blood Sugar _____ times a day  Blood Thinner Instructions:NA Aspirin Instructions: Last Dose:  Anesthesia review:   Patient denies shortness of breath, fever, cough and chest pain at PAT appointment yes  Patient verbalized understanding of instructions that were given to them at the PAT appointment. 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 She reports no SOB working or with ADLs

## 2020-12-13 NOTE — H&P (Signed)
TOTAL KNEE ADMISSION H&P  Patient is being admitted for right total knee arthroplasty.  Subjective:  Chief Complaint:  Right knee OA / pain  HPI: Melanie Lee, 75 y.o. female, has a history of pain and functional disability in the right knee due to arthritis and has failed non-surgical conservative treatments for greater than 12 weeks to include NSAID's and/or analgesics, corticosteriod injections, use of assistive devices and activity modification.  Onset of symptoms was gradual, starting 2+ years ago with gradually worsening course since that time. The patient noted prior procedures on the knee to include  arthroplasty on the left knee per Dr. Gladstone Lighter in 2017.  Patient currently rates pain in the right knee(s) at 8 out of 10 with activity. Patient has worsening of pain with activity and weight bearing, pain that interferes with activities of daily living, pain with passive range of motion, crepitus and joint swelling.  Patient has evidence of periarticular osteophytes and joint space narrowing by imaging studies. There is no active infection. Risks, benefits and expectations were discussed with the patient.  Risks including but not limited to the risk of anesthesia, blood clots, nerve damage, blood vessel damage, failure of the prosthesis, infection and up to and including death.  Patient understand the risks, benefits and expectations and wishes to proceed with surgery.   D/C Plans:       Home (obs)  Post-op Meds:       No Rx given  Tranexamic Acid:      To be given - IV   Decadron:      Is to be given  FYI:      ASA  Norco  DME:   Rx sent for - RW & 3-N-1  PT:   OPPT   Pharmacy: [facility] [address] [facility]   Patient Active Problem List   Diagnosis Date Noted  . Weight loss 03/24/2020  . Vitamin D deficiency 03/24/2020  . Fatigue 06/03/2018  . Hyperlipidemia LDL goal <100 06/03/2018  . Daytime somnolence 06/03/2018  . UTI (urinary tract infection) 11/09/2017   . Hyperglycemia 11/09/2017  . Arthralgia 11/09/2017  . Strain of cervical portion of right trapezius muscle 02/07/2017  . Status post total left knee replacement 04/23/2016  . Hyperlipidemia 04/01/2016  . Other fatigue 03/21/2015  . Visit for preventive health examination 10/28/2014  . Primary osteoarthritis of both knees 10/28/2014  . Colon cancer screening 10/28/2014  . Osteoporosis screening 10/28/2014   Past Medical History:  Diagnosis Date  . Elevated blood pressure (not hypertension)    "elevates sometimes with MD visits"  . H/O measles   . 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 ABDOMINAL HYSTERECTOMY  1995   total for fibroids  . BREAST BIOPSY  1995   benign  . CATARACT EXTRACTION  2013 & 2015   Bilateral  . KNEE SURGERY     Left Arthroscopy  . 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/2016   Procedure: TOTAL KNEE ARTHROPLASTY;  Surgeon: Latanya Maudlin, MD;  Location: [facility];  Service: Orthopedics;  Laterality: Left;    No current facility-administered medications for this encounter.   Current Outpatient Medications  Medication Sig Dispense Refill Last Dose  . conjugated estrogens (PREMARIN) vaginal cream Place 1 Applicatorful vaginally every Monday, Wednesday, and Friday.     . fluticasone (FLONASE) 50 MCG/ACT nasal spray Place 2 sprays into both nostrils daily. (  Patient taking differently: Place 2 sprays into both nostrils daily as needed for allergies.) 16 g 1   . Multiple Vitamins-Minerals (MULTIVITAMIN ADULTS 50+) TABS Take 1 tablet by mouth daily.      . pregabalin (LYRICA) 25 MG capsule Take 25 mg by mouth at bedtime.     . rosuvastatin (CRESTOR) 10 MG tablet Take 1 tablet (10 mg total) by mouth daily. Dc pravastatin if crestor covered/filled. 90 tablet 1   . Vitamin D, Ergocalciferol, (DRISDOL) 1.25 MG (50000 UNIT) CAPS capsule Take 1 capsule (50,000 Units total) by mouth  every 7 (seven) days. 12 capsule 1   . HYDROcodone-acetaminophen (NORCO) 5-325 MG tablet Take 1 tablet by mouth every 6 (six) hours as needed for moderate pain. (Patient not taking: No sig reported) 45 tablet 0 Not Taking at Unknown time  . meloxicam (MOBIC) 7.5 MG tablet Take 7.5 mg by mouth daily.      Allergies  Allergen Reactions  . Tramadol Nausea Only and Other (See Comments)    Dizziness    Social History   Tobacco Use  . Smoking status: Never Smoker  . Smokeless tobacco: Never Used  Substance Use Topics  . Alcohol use: Yes    Alcohol/week: 0.0 standard drinks    Comment: rare    Family History  Problem Relation Age of Onset  . Breast cancer Mother 48       Deceased  . Heart disease Mother   . Hypertension Mother   . Hypertension Father        Deceased  . Stroke Father   . Hypertension Sister   . Hypertension Brother   . Heart defect Daughter   . Hyperlipidemia Daughter   . Hypertension Daughter   . Vision loss Paternal Uncle   . Stroke Brother      Review of Systems  Constitutional: Negative.   HENT: Negative.   Eyes: Negative.   Respiratory: Negative.   Cardiovascular: Negative.   Gastrointestinal: Negative.   Genitourinary: Positive for frequency.  Musculoskeletal: Positive for joint pain.  Skin: Negative.   Neurological: Negative.   Endo/Heme/Allergies: Negative.   Psychiatric/Behavioral: Negative.      Objective:  Physical Exam Constitutional:      Appearance: She is well-developed.  HENT:     Head: Normocephalic.  Eyes:     Pupils: Pupils are equal, round, and reactive to light.  Neck:     Thyroid: No thyromegaly.     Vascular: No JVD.     Trachea: No tracheal deviation.  Cardiovascular:     Rate and Rhythm: Normal rate and regular rhythm.     Pulses: Intact distal pulses.  Pulmonary:     Effort: Pulmonary effort is normal. No respiratory distress.     Breath sounds: Normal breath sounds. No wheezing.  Abdominal:     Palpations:  Abdomen is soft.     Tenderness: There is no abdominal tenderness. There is no guarding.  Musculoskeletal:     Cervical back: Neck supple.     Right knee: Swelling and bony tenderness present. No erythema or ecchymosis. Decreased range of motion. Tenderness present.  Lymphadenopathy:     Cervical: No cervical adenopathy.  Skin:    General: Skin is warm and dry.  Neurological:     Mental Status: She is alert and oriented to person, place, and time.  Psychiatric:        Mood and Affect: Mood and affect normal.     Vital signs in last 24 hours:  Temp:  [99.1 F (37.3 C)] 99.1 F (37.3 C) (03/02 1108) Pulse Rate:  [77] 77 (03/02 1108) Resp:  [20] 20 (03/02 1108) BP: (149)/(78) 149/78 (03/02 1108) SpO2:  [96 %] 96 % (03/02 1108) Weight:  [65.8 kg] 65.8 kg (03/02 1108)  Labs:   Estimated body mass index is 24.13 kg/m as calculated from the following:   Height as of 12/13/20: 5\' 5"  (1.651 m).   Weight as of 12/13/20: 65.8 kg.   Imaging Review Plain radiographs demonstrate severe degenerative joint disease of the right knee.  The bone quality appears to be good for age and reported activity level.      Assessment/Plan:  End stage arthritis, right knee   The patient history, physical examination, clinical judgment of the provider and imaging studies are consistent with end stage degenerative joint disease of the right knee(s) and total knee arthroplasty is deemed medically necessary. The treatment options including medical management, injection therapy arthroscopy and arthroplasty were discussed at length. The risks and benefits of total knee arthroplasty were presented and reviewed. The risks due to aseptic loosening, infection, stiffness, patella tracking problems, thromboembolic complications and other imponderables were discussed. The patient acknowledged the explanation, agreed to proceed with the plan and consent was signed. Patient is being admitted for inpatient treatment for  surgery, pain control, PT, OT, prophylactic antibiotics, VTE prophylaxis, progressive ambulation and ADL's and discharge planning. The patient is planning to be discharged home.     Patient's anticipated LOS is less than 2 midnights, meeting these requirements: - Lives within 1 hour of care - Has a competent adult at home to recover with post-op recover - NO history of  - Chronic pain requiring opiods  - Diabetes  - Coronary Artery Disease  - Heart failure  - Heart attack  - Stroke  - DVT/VTE  - Cardiac arrhythmia  - Respiratory Failure/COPD  - Renal failure  - Anemia  - Advanced Liver disease

## 2020-12-22 ENCOUNTER — Other Ambulatory Visit (HOSPITAL_COMMUNITY)
Admission: RE | Admit: 2020-12-22 | Discharge: 2020-12-22 | Disposition: A | Discharge status: Home / Self Care | Payer: BC Managed Care – PPO | Source: Ambulatory Visit | Attending: Orthopedic Surgery | Admitting: Orthopedic Surgery

## 2020-12-22 DIAGNOSIS — Z01812 Encounter for preprocedural laboratory examination: Secondary | ICD-10-CM | POA: Insufficient documentation

## 2020-12-22 DIAGNOSIS — Z20822 Contact with and (suspected) exposure to covid-19: Secondary | ICD-10-CM | POA: Insufficient documentation

## 2020-12-22 DIAGNOSIS — N3281 Overactive bladder: Secondary | ICD-10-CM | POA: Diagnosis not present

## 2020-12-22 DIAGNOSIS — Z713 Dietary counseling and surveillance: Secondary | ICD-10-CM | POA: Diagnosis not present

## 2020-12-22 LAB — SARS CORONAVIRUS 2 (TAT 6-24 HRS): SARS Coronavirus 2: NEGATIVE

## 2020-12-25 NOTE — Anesthesia Preprocedure Evaluation (Addendum)
Anesthesia Evaluation  Patient identified by MRN, date of birth, ID band Patient awake    Reviewed: Allergy & Precautions, NPO status , Patient's Chart, lab work & pertinent test results  History of Anesthesia Complications Negative for: history of anesthetic complications  Airway Mallampati: II  TM Distance: >3 FB Neck ROM: Full    Dental  (+) Edentulous Lower, Edentulous Upper   Pulmonary neg pulmonary ROS,    Pulmonary exam normal        Cardiovascular negative cardio ROS Normal cardiovascular exam     Neuro/Psych negative neurological ROS  negative psych ROS   GI/Hepatic negative GI ROS, Neg liver ROS,   Endo/Other  negative endocrine ROS  Renal/GU negative Renal ROS  negative genitourinary   Musculoskeletal  (+) Arthritis ,   Abdominal   Peds  Hematology negative hematology ROS (+)   Anesthesia Other Findings Day of surgery medications reviewed with patient.  Reproductive/Obstetrics negative OB ROS                            Anesthesia Physical Anesthesia Plan  ASA: II  Anesthesia Plan: Spinal   Post-op Pain Management:  Regional for Post-op pain   Induction:   PONV Risk Score and Plan: 3 and Treatment may vary due to age or medical condition, Ondansetron, Propofol infusion and Dexamethasone  Airway Management Planned: Natural Airway and Simple Face Mask  Additional Equipment: None  Intra-op Plan:   Post-operative Plan:   Informed Consent: I have reviewed the patients History and Physical, chart, labs and discussed the procedure including the risks, benefits and alternatives for the proposed anesthesia with the patient or authorized representative who has indicated his/her understanding and acceptance.       Plan Discussed with: CRNA  Anesthesia Plan Comments:        Anesthesia Quick Evaluation

## 2020-12-26 ENCOUNTER — Observation Stay (HOSPITAL_COMMUNITY)
Admission: RE | Admit: 2020-12-26 | Discharge: 2020-12-27 | Disposition: A | Discharge status: Home / Self Care | Payer: BC Managed Care – PPO | Attending: Orthopedic Surgery | Admitting: Orthopedic Surgery

## 2020-12-26 ENCOUNTER — Other Ambulatory Visit: Payer: Self-pay

## 2020-12-26 ENCOUNTER — Ambulatory Visit (HOSPITAL_COMMUNITY): Payer: BC Managed Care – PPO | Admitting: Anesthesiology

## 2020-12-26 ENCOUNTER — Encounter (HOSPITAL_COMMUNITY): Payer: Self-pay | Admitting: Orthopedic Surgery

## 2020-12-26 ENCOUNTER — Encounter (HOSPITAL_COMMUNITY)
Admission: RE | Disposition: A | Discharge status: Home / Self Care | Payer: Self-pay | Source: Home / Self Care | Attending: Orthopedic Surgery

## 2020-12-26 DIAGNOSIS — M25461 Effusion, right knee: Secondary | ICD-10-CM | POA: Diagnosis not present

## 2020-12-26 DIAGNOSIS — Z79899 Other long term (current) drug therapy: Secondary | ICD-10-CM | POA: Diagnosis not present

## 2020-12-26 DIAGNOSIS — M1711 Unilateral primary osteoarthritis, right knee: Principal | ICD-10-CM | POA: Diagnosis present

## 2020-12-26 DIAGNOSIS — Z96651 Presence of right artificial knee joint: Secondary | ICD-10-CM

## 2020-12-26 DIAGNOSIS — G8918 Other acute postprocedural pain: Secondary | ICD-10-CM | POA: Diagnosis not present

## 2020-12-26 DIAGNOSIS — M25761 Osteophyte, right knee: Secondary | ICD-10-CM | POA: Diagnosis not present

## 2020-12-26 HISTORY — PX: TOTAL KNEE ARTHROPLASTY: SHX125

## 2020-12-26 SURGERY — ARTHROPLASTY, KNEE, TOTAL
Anesthesia: Spinal | Site: Knee | Laterality: Right

## 2020-12-26 MED ORDER — FENTANYL CITRATE (PF) 100 MCG/2ML IJ SOLN: 25.0000 ug | INTRAMUSCULAR | Status: DC | PRN

## 2020-12-26 MED ORDER — DOCUSATE SODIUM 100 MG PO CAPS
100.0000 mg | ORAL_CAPSULE | Freq: Two times a day (BID) | ORAL | Status: DC
  Administered 2020-12-26 – 2020-12-27 (×2): 100 mg via ORAL
  Filled 2020-12-26 (×2): qty 1

## 2020-12-26 MED ORDER — BUPIVACAINE-EPINEPHRINE (PF) 0.25% -1:200000 IJ SOLN
INTRAMUSCULAR | Status: AC
  Filled 2020-12-26: qty 30

## 2020-12-26 MED ORDER — CLONIDINE HCL (ANALGESIA) 100 MCG/ML EP SOLN
EPIDURAL | Status: DC | PRN
  Administered 2020-12-26: 100 ug

## 2020-12-26 MED ORDER — LACTATED RINGERS IV SOLN: INTRAVENOUS | Status: DC

## 2020-12-26 MED ORDER — HYDROCODONE-ACETAMINOPHEN 7.5-325 MG PO TABS
1.0000 | ORAL_TABLET | ORAL | Status: DC | PRN
  Administered 2020-12-26 (×2): 1 via ORAL
  Filled 2020-12-26 (×2): qty 1

## 2020-12-26 MED ORDER — ONDANSETRON HCL 4 MG PO TABS: 4.0000 mg | ORAL_TABLET | Freq: Four times a day (QID) | ORAL | Status: DC | PRN

## 2020-12-26 MED ORDER — FENTANYL CITRATE (PF) 100 MCG/2ML IJ SOLN
INTRAMUSCULAR | Status: AC
  Filled 2020-12-26: qty 2

## 2020-12-26 MED ORDER — PROPOFOL 500 MG/50ML IV EMUL
INTRAVENOUS | Status: AC
  Filled 2020-12-26: qty 50

## 2020-12-26 MED ORDER — CHLORHEXIDINE GLUCONATE 0.12 % MT SOLN: 15.0000 mL | Freq: Once | OROMUCOSAL | Status: AC

## 2020-12-26 MED ORDER — METHOCARBAMOL 500 MG PO TABS
500.0000 mg | ORAL_TABLET | Freq: Four times a day (QID) | ORAL | Status: DC | PRN
  Administered 2020-12-26: 500 mg via ORAL
  Filled 2020-12-26: qty 1

## 2020-12-26 MED ORDER — ONDANSETRON HCL 4 MG/2ML IJ SOLN
INTRAMUSCULAR | Status: DC | PRN
  Administered 2020-12-26: 4 mg via INTRAVENOUS

## 2020-12-26 MED ORDER — PROPOFOL 500 MG/50ML IV EMUL
INTRAVENOUS | Status: DC | PRN
  Administered 2020-12-26: 75 ug/kg/min via INTRAVENOUS

## 2020-12-26 MED ORDER — MENTHOL 3 MG MT LOZG: 1.0000 | LOZENGE | OROMUCOSAL | Status: DC | PRN

## 2020-12-26 MED ORDER — DEXAMETHASONE SODIUM PHOSPHATE 10 MG/ML IJ SOLN
10.0000 mg | Freq: Once | INTRAMUSCULAR | Status: AC
  Administered 2020-12-26: 5 mg via INTRAVENOUS

## 2020-12-26 MED ORDER — OXYCODONE HCL 5 MG/5ML PO SOLN: 5.0000 mg | Freq: Once | ORAL | Status: DC | PRN

## 2020-12-26 MED ORDER — DEXAMETHASONE SODIUM PHOSPHATE 10 MG/ML IJ SOLN
10.0000 mg | Freq: Once | INTRAMUSCULAR | Status: AC
  Administered 2020-12-27: 10 mg via INTRAVENOUS
  Filled 2020-12-26: qty 1

## 2020-12-26 MED ORDER — TRANEXAMIC ACID-NACL 1000-0.7 MG/100ML-% IV SOLN
INTRAVENOUS | Status: AC
  Filled 2020-12-26: qty 100

## 2020-12-26 MED ORDER — POVIDONE-IODINE 10 % EX SWAB
2.0000 "application " | Freq: Once | CUTANEOUS | Status: AC
  Administered 2020-12-26: 2 via TOPICAL

## 2020-12-26 MED ORDER — SODIUM CHLORIDE (PF) 0.9 % IJ SOLN
INTRAMUSCULAR | Status: DC | PRN
  Administered 2020-12-26: 30 mL

## 2020-12-26 MED ORDER — EPHEDRINE 5 MG/ML INJ
INTRAVENOUS | Status: AC
  Filled 2020-12-26: qty 10

## 2020-12-26 MED ORDER — PROMETHAZINE HCL 25 MG/ML IJ SOLN: 6.2500 mg | INTRAMUSCULAR | Status: DC | PRN

## 2020-12-26 MED ORDER — FERROUS SULFATE 325 (65 FE) MG PO TABS
325.0000 mg | ORAL_TABLET | Freq: Two times a day (BID) | ORAL | Status: DC
  Administered 2020-12-27: 325 mg via ORAL
  Filled 2020-12-26: qty 1

## 2020-12-26 MED ORDER — KETOROLAC TROMETHAMINE 30 MG/ML IJ SOLN
INTRAMUSCULAR | Status: AC
  Filled 2020-12-26: qty 1

## 2020-12-26 MED ORDER — SODIUM CHLORIDE 0.9 % IV SOLN: INTRAVENOUS | Status: DC

## 2020-12-26 MED ORDER — ASPIRIN 81 MG PO CHEW
81.0000 mg | CHEWABLE_TABLET | Freq: Two times a day (BID) | ORAL | Status: DC
  Administered 2020-12-26 – 2020-12-27 (×2): 81 mg via ORAL
  Filled 2020-12-26 (×2): qty 1

## 2020-12-26 MED ORDER — ACETAMINOPHEN 500 MG PO TABS
ORAL_TABLET | ORAL | Status: AC
  Administered 2020-12-26: 1000 mg via ORAL
  Filled 2020-12-26: qty 2

## 2020-12-26 MED ORDER — MIDAZOLAM HCL 5 MG/5ML IJ SOLN
INTRAMUSCULAR | Status: DC | PRN
  Administered 2020-12-26 (×2): 1 mg via INTRAVENOUS

## 2020-12-26 MED ORDER — METOCLOPRAMIDE HCL 5 MG PO TABS: 5.0000 mg | ORAL_TABLET | Freq: Three times a day (TID) | ORAL | Status: DC | PRN

## 2020-12-26 MED ORDER — POLYETHYLENE GLYCOL 3350 17 G PO PACK
17.0000 g | PACK | Freq: Two times a day (BID) | ORAL | Status: DC
  Administered 2020-12-26 – 2020-12-27 (×2): 17 g via ORAL
  Filled 2020-12-26 (×2): qty 1

## 2020-12-26 MED ORDER — PHENOL 1.4 % MT LIQD: 1.0000 | OROMUCOSAL | Status: DC | PRN

## 2020-12-26 MED ORDER — TRANEXAMIC ACID-NACL 1000-0.7 MG/100ML-% IV SOLN
1000.0000 mg | Freq: Once | INTRAVENOUS | Status: AC
  Administered 2020-12-26: 1000 mg via INTRAVENOUS
  Filled 2020-12-26: qty 100

## 2020-12-26 MED ORDER — PREGABALIN 25 MG PO CAPS
25.0000 mg | ORAL_CAPSULE | Freq: Every day | ORAL | Status: DC
  Administered 2020-12-26: 25 mg via ORAL
  Filled 2020-12-26: qty 1

## 2020-12-26 MED ORDER — TRANEXAMIC ACID-NACL 1000-0.7 MG/100ML-% IV SOLN
1000.0000 mg | INTRAVENOUS | Status: AC
  Administered 2020-12-26: 1000 mg via INTRAVENOUS

## 2020-12-26 MED ORDER — ACETAMINOPHEN 325 MG PO TABS: 325.0000 mg | ORAL_TABLET | Freq: Four times a day (QID) | ORAL | Status: DC | PRN

## 2020-12-26 MED ORDER — METOCLOPRAMIDE HCL 5 MG/ML IJ SOLN: 5.0000 mg | Freq: Three times a day (TID) | INTRAMUSCULAR | Status: DC | PRN

## 2020-12-26 MED ORDER — STERILE WATER FOR IRRIGATION IR SOLN
Status: DC | PRN
  Administered 2020-12-26: 2000 mL

## 2020-12-26 MED ORDER — DIPHENHYDRAMINE HCL 12.5 MG/5ML PO ELIX: 12.5000 mg | ORAL_SOLUTION | ORAL | Status: DC | PRN

## 2020-12-26 MED ORDER — HYDROCODONE-ACETAMINOPHEN 5-325 MG PO TABS
1.0000 | ORAL_TABLET | ORAL | Status: DC | PRN
  Administered 2020-12-27 (×2): 1 via ORAL
  Filled 2020-12-26 (×2): qty 1

## 2020-12-26 MED ORDER — PHENYLEPHRINE 40 MCG/ML (10ML) SYRINGE FOR IV PUSH (FOR BLOOD PRESSURE SUPPORT)
PREFILLED_SYRINGE | INTRAVENOUS | Status: DC | PRN
  Administered 2020-12-26: 80 ug via INTRAVENOUS
  Administered 2020-12-26: 40 ug via INTRAVENOUS
  Administered 2020-12-26: 80 ug via INTRAVENOUS
  Administered 2020-12-26: 40 ug via INTRAVENOUS

## 2020-12-26 MED ORDER — 0.9 % SODIUM CHLORIDE (POUR BTL) OPTIME
TOPICAL | Status: DC | PRN
  Administered 2020-12-26: 1000 mL

## 2020-12-26 MED ORDER — BUPIVACAINE-EPINEPHRINE (PF) 0.5% -1:200000 IJ SOLN
INTRAMUSCULAR | Status: DC | PRN
  Administered 2020-12-26: 15 mL via PERINEURAL

## 2020-12-26 MED ORDER — MAGNESIUM CITRATE PO SOLN: 1.0000 | Freq: Once | ORAL | Status: DC | PRN

## 2020-12-26 MED ORDER — ONDANSETRON HCL 4 MG/2ML IJ SOLN: 4.0000 mg | Freq: Four times a day (QID) | INTRAMUSCULAR | Status: DC | PRN

## 2020-12-26 MED ORDER — METHOCARBAMOL 1000 MG/10ML IJ SOLN
500.0000 mg | Freq: Four times a day (QID) | INTRAVENOUS | Status: DC | PRN
  Filled 2020-12-26: qty 5

## 2020-12-26 MED ORDER — ALUM & MAG HYDROXIDE-SIMETH 200-200-20 MG/5ML PO SUSP: 15.0000 mL | ORAL | Status: DC | PRN

## 2020-12-26 MED ORDER — HYDROMORPHONE HCL 1 MG/ML IJ SOLN
0.5000 mg | INTRAMUSCULAR | Status: DC | PRN
  Administered 2020-12-26: 0.5 mg via INTRAVENOUS
  Filled 2020-12-26: qty 1

## 2020-12-26 MED ORDER — BISACODYL 10 MG RE SUPP: 10.0000 mg | Freq: Every day | RECTAL | Status: DC | PRN

## 2020-12-26 MED ORDER — ORAL CARE MOUTH RINSE
15.0000 mL | Freq: Once | OROMUCOSAL | Status: AC
  Administered 2020-12-26: 15 mL via OROMUCOSAL

## 2020-12-26 MED ORDER — CEFAZOLIN SODIUM-DEXTROSE 2-4 GM/100ML-% IV SOLN
2.0000 g | Freq: Four times a day (QID) | INTRAVENOUS | Status: AC
  Administered 2020-12-26 (×2): 2 g via INTRAVENOUS
  Filled 2020-12-26 (×2): qty 100

## 2020-12-26 MED ORDER — ACETAMINOPHEN 500 MG PO TABS: 1000.0000 mg | ORAL_TABLET | Freq: Once | ORAL | Status: AC

## 2020-12-26 MED ORDER — ROSUVASTATIN CALCIUM 10 MG PO TABS
10.0000 mg | ORAL_TABLET | Freq: Every day | ORAL | Status: DC
  Administered 2020-12-27: 10 mg via ORAL
  Filled 2020-12-26: qty 1

## 2020-12-26 MED ORDER — SODIUM CHLORIDE (PF) 0.9 % IJ SOLN
INTRAMUSCULAR | Status: AC
  Filled 2020-12-26: qty 30

## 2020-12-26 MED ORDER — KETOROLAC TROMETHAMINE 30 MG/ML IJ SOLN
INTRAMUSCULAR | Status: DC | PRN
  Administered 2020-12-26: 30 mg

## 2020-12-26 MED ORDER — FENTANYL CITRATE (PF) 100 MCG/2ML IJ SOLN
INTRAMUSCULAR | Status: DC | PRN
  Administered 2020-12-26 (×2): 50 ug via INTRAVENOUS

## 2020-12-26 MED ORDER — PHENYLEPHRINE 40 MCG/ML (10ML) SYRINGE FOR IV PUSH (FOR BLOOD PRESSURE SUPPORT)
PREFILLED_SYRINGE | INTRAVENOUS | Status: AC
  Filled 2020-12-26: qty 10

## 2020-12-26 MED ORDER — CEFAZOLIN SODIUM-DEXTROSE 2-4 GM/100ML-% IV SOLN
2.0000 g | INTRAVENOUS | Status: AC
Start: 2020-12-26 — End: 2020-12-26
  Administered 2020-12-26: 2 g via INTRAVENOUS

## 2020-12-26 MED ORDER — MIDAZOLAM HCL 2 MG/2ML IJ SOLN
INTRAMUSCULAR | Status: AC
  Filled 2020-12-26: qty 2

## 2020-12-26 MED ORDER — OXYCODONE HCL 5 MG PO TABS: 5.0000 mg | ORAL_TABLET | Freq: Once | ORAL | Status: DC | PRN

## 2020-12-26 MED ORDER — CEFAZOLIN SODIUM-DEXTROSE 2-4 GM/100ML-% IV SOLN
INTRAVENOUS | Status: AC
  Filled 2020-12-26: qty 100

## 2020-12-26 MED ORDER — CELECOXIB 200 MG PO CAPS
200.0000 mg | ORAL_CAPSULE | Freq: Two times a day (BID) | ORAL | Status: DC
  Administered 2020-12-26 – 2020-12-27 (×2): 200 mg via ORAL
  Filled 2020-12-26 (×2): qty 1

## 2020-12-26 MED ORDER — BUPIVACAINE IN DEXTROSE 0.75-8.25 % IT SOLN
INTRATHECAL | Status: DC | PRN
  Administered 2020-12-26: 1.6 mL via INTRATHECAL

## 2020-12-26 MED ORDER — BUPIVACAINE-EPINEPHRINE (PF) 0.25% -1:200000 IJ SOLN
INTRAMUSCULAR | Status: DC | PRN
  Administered 2020-12-26: 30 mL

## 2020-12-26 MED ORDER — EPHEDRINE SULFATE-NACL 50-0.9 MG/10ML-% IV SOSY
PREFILLED_SYRINGE | INTRAVENOUS | Status: DC | PRN
  Administered 2020-12-26 (×2): 10 mg via INTRAVENOUS
  Administered 2020-12-26: 5 mg via INTRAVENOUS

## 2020-12-26 SURGICAL SUPPLY — 55 items
ATTUNE MED ANAT PAT 35 KNEE (Knees) ×2 IMPLANT
ATTUNE PSFEM RTSZ4 NARCEM KNEE (Femur) ×2 IMPLANT
ATTUNE PSRP INSR SZ4 8 KNEE (Insert) ×2 IMPLANT
BAG ZIPLOCK 12X15 (MISCELLANEOUS) IMPLANT
BASEPLATE TIBIAL ROTATING SZ 4 (Knees) ×2 IMPLANT
BLADE SAW SGTL 11.0X1.19X90.0M (BLADE) IMPLANT
BLADE SAW SGTL 13.0X1.19X90.0M (BLADE) ×2 IMPLANT
BLADE SURG SZ10 CARB STEEL (BLADE) ×4 IMPLANT
BNDG ELASTIC 6X10 VLCR STRL LF (GAUZE/BANDAGES/DRESSINGS) ×2 IMPLANT
BNDG ELASTIC 6X5.8 VLCR STR LF (GAUZE/BANDAGES/DRESSINGS) ×2 IMPLANT
BOWL SMART MIX CTS (DISPOSABLE) ×2 IMPLANT
CEMENT HV SMART SET (Cement) ×4 IMPLANT
COVER WAND RF STERILE (DRAPES) IMPLANT
CUFF TOURN SGL QUICK 34 (TOURNIQUET CUFF) ×1
CUFF TRNQT CYL 34X4.125X (TOURNIQUET CUFF) ×1 IMPLANT
DECANTER SPIKE VIAL GLASS SM (MISCELLANEOUS) ×4 IMPLANT
DERMABOND ADVANCED (GAUZE/BANDAGES/DRESSINGS) ×1
DERMABOND ADVANCED .7 DNX12 (GAUZE/BANDAGES/DRESSINGS) ×1 IMPLANT
DRAPE U-SHAPE 47X51 STRL (DRAPES) ×2 IMPLANT
DRESSING AQUACEL AG SP 3.5X10 (GAUZE/BANDAGES/DRESSINGS) ×1 IMPLANT
DRSG AQUACEL AG SP 3.5X10 (GAUZE/BANDAGES/DRESSINGS) ×2
DURAPREP 26ML APPLICATOR (WOUND CARE) ×4 IMPLANT
ELECT REM PT RETURN 15FT ADLT (MISCELLANEOUS) ×2 IMPLANT
GLOVE ORTHO TXT STRL SZ7.5 (GLOVE) ×2 IMPLANT
GLOVE SURG ENC MOIS LTX SZ6 (GLOVE) IMPLANT
GLOVE SURG LTX SZ8 (GLOVE) ×2 IMPLANT
GLOVE SURG UNDER POLY LF SZ6.5 (GLOVE) IMPLANT
GLOVE SURG UNDER POLY LF SZ7.5 (GLOVE) ×2 IMPLANT
GLOVE SURG UNDER POLY LF SZ8.5 (GLOVE) IMPLANT
GOWN STRL REUS W/TWL 2XL LVL3 (GOWN DISPOSABLE) IMPLANT
GOWN STRL REUS W/TWL LRG LVL3 (GOWN DISPOSABLE) ×2 IMPLANT
HANDPIECE INTERPULSE COAX TIP (DISPOSABLE) ×1
HOLDER FOLEY CATH W/STRAP (MISCELLANEOUS) IMPLANT
KIT TURNOVER KIT A (KITS) ×2 IMPLANT
MANIFOLD NEPTUNE II (INSTRUMENTS) ×2 IMPLANT
NDL SAFETY ECLIPSE 18X1.5 (NEEDLE) IMPLANT
NEEDLE HYPO 18GX1.5 SHARP (NEEDLE)
NS IRRIG 1000ML POUR BTL (IV SOLUTION) ×2 IMPLANT
PACK TOTAL KNEE CUSTOM (KITS) ×2 IMPLANT
PENCIL SMOKE EVACUATOR (MISCELLANEOUS) IMPLANT
PIN DRILL FIX HALF THREAD (BIT) ×2 IMPLANT
PIN FIX SIGMA LCS THRD HI (PIN) ×2 IMPLANT
PROTECTOR NERVE ULNAR (MISCELLANEOUS) ×2 IMPLANT
SET HNDPC FAN SPRY TIP SCT (DISPOSABLE) ×1 IMPLANT
SET PAD KNEE POSITIONER (MISCELLANEOUS) ×2 IMPLANT
SUT MNCRL AB 4-0 PS2 18 (SUTURE) ×2 IMPLANT
SUT STRATAFIX PDS+ 0 24IN (SUTURE) ×2 IMPLANT
SUT VIC AB 1 CT1 36 (SUTURE) ×2 IMPLANT
SUT VIC AB 2-0 CT1 27 (SUTURE) ×3
SUT VIC AB 2-0 CT1 TAPERPNT 27 (SUTURE) ×3 IMPLANT
SYR 3ML LL SCALE MARK (SYRINGE) ×2 IMPLANT
TRAY FOLEY MTR SLVR 16FR STAT (SET/KITS/TRAYS/PACK) ×2 IMPLANT
TUBE SUCTION HIGH CAP CLEAR NV (SUCTIONS) ×2 IMPLANT
WATER STERILE IRR 1000ML POUR (IV SOLUTION) ×4 IMPLANT
WRAP KNEE MAXI GEL POST OP (GAUZE/BANDAGES/DRESSINGS) ×2 IMPLANT

## 2020-12-26 NOTE — Anesthesia Procedure Notes (Signed)
Date/Time: 12/26/2020 7:24 AM Performed by: Lollie Sails, CRNA Pre-anesthesia Checklist: Patient identified, Emergency Drugs available, Suction available and Patient being monitored Oxygen Delivery Method: Simple face mask

## 2020-12-26 NOTE — Transfer of Care (Signed)
Immediate Anesthesia Transfer of Care Note  Patient: Melanie Lee  Procedure(s) Performed: TOTAL KNEE ARTHROPLASTY (Right Knee)  Patient Location: PACU  Anesthesia Type:Spinal and MAC combined with regional for post-op pain  Level of Consciousness: awake, alert  and patient cooperative  Airway & Oxygen Therapy: Patient Spontanous Breathing and Patient connected to face mask oxygen  Post-op Assessment: Report given to RN and Post -op Vital signs reviewed and stable  Post vital signs: Reviewed and stable  Last Vitals:  Vitals Value Taken Time  BP 127/73 12/26/20 0909  Temp    Pulse 77 12/26/20 0911  Resp 16 12/26/20 0911  SpO2 100 % 12/26/20 0911  Vitals shown include unvalidated device data.  Last Pain:  Vitals:   12/26/20 0606  TempSrc: Oral         Complications: No complications documented.

## 2020-12-26 NOTE — Plan of Care (Signed)
Discussed with patient about plan of care for post-op day 0. ° ° °Will continue to monitor patient.  ° ° °SWhittemore, RN ° °

## 2020-12-26 NOTE — Anesthesia Procedure Notes (Signed)
Anesthesia Regional Block: Adductor canal block   Pre-Anesthetic Checklist: ,, timeout performed, Correct Patient, Correct Site, Correct Laterality, Correct Procedure, Correct Position, site marked, Risks and benefits discussed, pre-op evaluation,  At surgeon's request and post-op pain management  Laterality: Right  Prep: Maximum Sterile Barrier Precautions used, chloraprep       Needles:  Injection technique: Single-shot  Needle Type: Echogenic Stimulator Needle     Needle Length: 9cm  Needle Gauge: 22     Additional Needles:   Procedures:,,,, ultrasound used (permanent image in chart),,,,  Narrative:  Start time: 12/26/2020 6:55 AM End time: 12/26/2020 6:57 AM Injection made incrementally with aspirations every 5 mL.  Performed by: Personally  Anesthesiologist: Brennan Bailey, MD  Additional Notes: Risks, benefits, and alternative discussed. Patient gave consent for procedure. Patient prepped and draped in sterile fashion. Sedation administered, patient remains easily responsive to voice. Relevant anatomy identified with ultrasound guidance. Local anesthetic given in 5cc increments with no signs or symptoms of intravascular injection. No pain or paraesthesias with injection. Patient monitored throughout procedure with signs of LAST or immediate complications. Tolerated well. Ultrasound image placed in chart.  Tawny Asal, MD

## 2020-12-26 NOTE — Anesthesia Postprocedure Evaluation (Signed)
Anesthesia Post Note  Patient: Melanie Lee  Procedure(s) Performed: TOTAL KNEE ARTHROPLASTY (Right Knee)     Patient location during evaluation: PACU Anesthesia Type: Spinal Level of consciousness: awake and alert and oriented Pain management: pain level controlled Vital Signs Assessment: post-procedure vital signs reviewed and stable Respiratory status: spontaneous breathing, nonlabored ventilation and respiratory function stable Cardiovascular status: blood pressure returned to baseline Postop Assessment: no apparent nausea or vomiting, spinal receding, no headache and no backache Anesthetic complications: no   No complications documented.  Last Vitals:  Vitals:   12/26/20 0945 12/26/20 1010  BP: 122/71 133/75  Pulse: 72 74  Resp: 17 16  Temp:  36.6 C  SpO2: 98% 97%    Last Pain:  Vitals:   12/26/20 1010  TempSrc: Oral  PainSc: [place]

## 2020-12-26 NOTE — Anesthesia Procedure Notes (Signed)
Spinal  Patient location during procedure: OR Start time: 12/26/2020 7:24 AM End time: 12/26/2020 7:28 AM Staffing Performed: resident/CRNA  Resident/CRNA: Lollie Sails, CRNA Preanesthetic Checklist Completed: patient identified, IV checked, site marked, risks and benefits discussed, surgical consent, monitors and equipment checked, pre-op evaluation and timeout performed Spinal Block Patient position: sitting Prep: DuraPrep and site prepped and draped Patient monitoring: heart rate, continuous pulse ox, blood pressure and cardiac monitor Approach: midline Location: L3-4 Injection technique: single-shot Needle Needle type: Pencan  Needle gauge: 24 G Needle length: 10 cm Additional Notes Expiration date on kit noted and within range.  Sterile prep completed.  Good CSF flow noted with no heme or c/o paresthesia.   Patient tolerated well.

## 2020-12-26 NOTE — Op Note (Signed)
NAME:  Melanie Lee                      MEDICAL RECORD NO.:  [id number]                             FACILITY:  [facility]      PHYSICIAN:  Pietro Cassis. Alvan Dame, M.D.  DATE OF BIRTH:  08-13-1946      DATE OF PROCEDURE:  12/26/2020                                     OPERATIVE REPORT         PREOPERATIVE DIAGNOSIS:  Right knee osteoarthritis.      POSTOPERATIVE DIAGNOSIS:  Right knee osteoarthritis.      FINDINGS:  The patient was noted to have complete loss of cartilage and   bone-on-bone arthritis with associated osteophytes in the lateral and patellofemoral compartments of   the knee with a significant synovitis and associated effusion.  The patient had failed months of conservative treatment including medications, injection therapy, activity modification.     PROCEDURE:  Right total knee replacement.      COMPONENTS USED:  DePuy Attune rotating platform posterior stabilized knee   system, a size 4N femur, 4 tibia, size 8 mm PS AOX insert, and 35 anatomic patellar   button.      SURGEON:  Pietro Cassis. Alvan Dame, M.D.      ASSISTANT:  Griffith Citron, PA-C.      ANESTHESIA:  Regional and Spinal.      SPECIMENS:  None.      COMPLICATION:  None.      DRAINS:  None.  EBL: <100 cc      TOURNIQUET TIME:   Total Tourniquet Time Documented: Thigh (Right) - 33 minutes Total: Thigh (Right) - 33 minutes  .      The patient was stable to the recovery room.      INDICATION FOR PROCEDURE:  Melanie Lee is a 75 y.o. female patient of   mine.  The patient had been seen, evaluated, and treated for months conservatively in the   office with medication, activity modification, and injections.  The patient had   radiographic changes of bone-on-bone arthritis with endplate sclerosis and osteophytes noted.  Based on the radiographic changes and failed conservative measures, the patient   decided to proceed with definitive treatment, total knee replacement.  Risks of infection, DVT, component  failure, need for revision surgery, neurovascular injury were reviewed in the office setting.  The postop course was reviewed stressing the efforts to maximize post-operative satisfaction and function.  Consent was obtained for benefit of pain   relief.      PROCEDURE IN DETAIL:  The patient was brought to the operative theater.   Once adequate anesthesia, preoperative antibiotics, 2 gm of Ancef,1 gm of Tranexamic Acid, and 10 mg of Decadron administered, the patient was positioned supine with a right thigh tourniquet placed.  The  right lower extremity was prepped and draped in sterile fashion.  A time-   out was performed identifying the patient, planned procedure, and the appropriate extremity.      The right lower extremity was placed in the [id number] leg holder.  The leg was   exsanguinated, tourniquet elevated to 250 mmHg.  A midline incision was   made followed  by median parapatellar arthrotomy.  Following initial   exposure, attention was first directed to the patella.  Precut   measurement was noted to be 23 mm.  I resected down to 14 mm and used a   35 anatomic patellar button to restore patellar height as well as cover the cut surface.      The lug holes were drilled and a metal shim was placed to protect the   patella from retractors and saw blade during the procedure.      At this point, attention was now directed to the femur.  The femoral   canal was opened with a drill, irrigated to try to prevent fat emboli.  An   intramedullary rod was passed at 5 degrees valgus, 9 mm of bone was   resected off the distal femur.  Following this resection, the tibia was   subluxated anteriorly.  Using the extramedullary guide, 2 mm of bone was resected off   the proximal lateral tibia.  We confirmed the gap would be   stable medially and laterally with a size 6 spacer block as well as confirmed that the tibial cut was perpendicular in the coronal plane, checking with an alignment rod.       Once this was done, I sized the femur to be a size 4 in the anterior-   posterior dimension, chose a narrow component based on medial and   lateral dimension.  The size 4 rotation block was then pinned in   position anterior referenced using the C-clamp to set rotation.  The   anterior, posterior, and  chamfer cuts were made without difficulty nor   notching making certain that I was along the anterior cortex to help   with flexion gap stability.      The final box cut was made off the lateral aspect of distal femur.      At this point, the tibia was sized to be a size 4.  The size 4 tray was   then pinned in position through the medial third of the tubercle,   drilled, and keel punched.  Trial reduction was now carried with a 4 femur,  4 tibia, a size 8 mm PS insert, and the 35 anatomic patella botton.  The knee was brought to full extension with good flexion stability with the patella   tracking through the trochlea without application of pressure.  Given   all these findings the trial components removed.  Final components were   opened and cement was mixed.  The knee was irrigated with normal saline solution and pulse lavage.  The synovial lining was   then injected with 30 cc of 0.25% Marcaine with epinephrine, 1 cc of Toradol and 30 cc of NS for a total of 61 cc.     Final implants were then cemented onto cleaned and dried cut surfaces of bone with the knee brought to extension with a size 8 mm PS trial insert.      Once the cement had fully cured, excess cement was removed   throughout the knee.  I confirmed that I was satisfied with the range of   motion and stability, and the final size 8 mm PS AOX insert was chosen.  It was   placed into the knee.      The tourniquet had been let down at 33 minutes.  No significant   hemostasis was required.  The extensor mechanism was then reapproximated using #1 Vicryl and #1  Stratafix sutures with the knee   in flexion.  The   remaining wound  was closed with 2-0 Vicryl and running 4-0 Monocryl.   The knee was cleaned, dried, dressed sterilely using Dermabond and   Aquacel dressing.  The patient was then   brought to recovery room in stable condition, tolerating the procedure   well.   Please note that Physician Assistant, Griffith Citron, PA-C was present for the entirety of the case, and was utilized for pre-operative positioning, peri-operative retractor management, general facilitation of the procedure and for primary wound closure at the end of the case.              Pietro Cassis Alvan Dame, M.D.    12/26/2020 8:46 AM

## 2020-12-26 NOTE — Interval H&P Note (Signed)
History and Physical Interval Note:  12/26/2020 6:58 AM  Melanie Lee  has presented today for surgery, with the diagnosis of Right knee osteoarthritis.  The various methods of treatment have been discussed with the patient and family. After consideration of risks, benefits and other options for treatment, the patient has consented to  Procedure(s) with comments: TOTAL KNEE ARTHROPLASTY (Right) - 70 mins as a surgical intervention.  The patient's history has been reviewed, patient examined, no change in status, stable for surgery.  I have reviewed the patient's chart and labs.  Questions were answered to the patient's satisfaction.     Mauri Pole

## 2020-12-26 NOTE — Evaluation (Signed)
Physical Therapy Evaluation Patient Details Name: Melanie Lee MRN: [id number] DOB: 1946-02-15 Today's Date: 12/26/2020   History of Present Illness  Patient is 75 y.o. female s/p Rt TKA on 12/26/20 with PMH significant for OA, HTN, Lt TKA in 2017.  Clinical Impression  Pt is a 75y.o. female s/p Rt TKA POD 0. Pt reports that he is independent with intermittent use of cane for mobility at baseline. Pt required MIN assist for safety/stability with cues for safe hand placement for sit to stand transfer. Pt required MIN assist for safety with ambulation 73ft with verbal cues for RW management and step to gait pattern with no LOB. Pt demonstrated flexed trunk/hip and decreased Rt knee flexion in stance despite cues for upright posture/hip extension. PT reviewed therapeutic intervention for promotion of DVT prevention, pt demonstrated understanding. Pt lives alone and does not currently have guarnteed caregiver support upon discharge, her daughter may be able to assist. Recommend HHPT vs. SNF pending pt progress and ability to obtain adequate level of support needed for safety at home. Pt will benefit from skilled PT to increase independence and safety with mobility. Acute therapy to follow up during stay to progress functional mobility as able to ensure safe discharge home.       Follow Up Recommendations Home health PT;Follow surgeon's recommendation for DC plan and follow-up therapies;SNF (HHPT vs. SNF pending progress and home support)    Equipment Recommendations  Rolling walker with 5" wheels    Recommendations for Other Services       Precautions / Restrictions Precautions Precautions: Fall Restrictions Weight Bearing Restrictions: No Other Position/Activity Restrictions: WBAT      Mobility  Bed Mobility Overal bed mobility: Needs Assistance Bed Mobility: Supine to Sit     Supine to sit: Supervision;HOB elevated     General bed mobility comments: use of B UEs to scoot to EOB  with supervision for safety    Transfers Overall transfer level: Needs assistance Equipment used: Rolling walker (2 wheeled) Transfers: Sit to/from Stand Sit to Stand: Min assist         General transfer comment: MIN assist for safety/stabilty with cues for safe hand placement  Ambulation/Gait Ambulation/Gait assistance: Min assist Gait Distance (Feet): 34 Feet Assistive device: Rolling walker (2 wheeled) Gait Pattern/deviations: Step-to pattern;Decreased stride length;Decreased weight shift to right;Trunk flexed (trunk/hip flexed, decreased knee flexion Rt LE) Gait velocity: decr   General Gait Details: Pt perfomed pre gait marching with use of B UES on RW with no knee buckling. MIN assist for safety with cues for step to gait pattern with no LOB. Pt demonstrated flexed trunk/hip and decreased Rt knee flexion in stance despite cues for upright posture/hip extension. Pt unsure if this is a baseline gait deviation/compensation.  Stairs            Wheelchair Mobility    Modified Rankin (Stroke Patients Only)       Balance Overall balance assessment: Needs assistance Sitting-balance support: Feet supported Sitting balance-Leahy Scale: Good     Standing balance support: Bilateral upper extremity supported;During functional activity Standing balance-Leahy Scale: Poor Standing balance comment: use of RW for standing balance                             Pertinent Vitals/Pain Pain Assessment: 0-10 Pain Score: 3  Pain Location: Rt knee Pain Descriptors / Indicators: Discomfort;Sore Pain Intervention(s): Limited activity within patient's tolerance;Monitored during session;Repositioned;Ice applied    Home  Living Family/patient expects to be discharged to:: Private residence Living Arrangements: Alone Available Help at Discharge: Family Type of Home: Apartment Home Access: Stairs to enter Entrance Stairs-Rails: Can reach both Entrance Stairs-Number of  Steps: 3 Home Layout: One level Home Equipment: Shower seat Additional Comments: Pt's states that her daughter may be able to assist but is not positive, her daughter is moving to [place] but pt is unsure when exactly. Pt does not have plans for any other family memebrs/friends to assist at this time    Prior Function Level of Independence: Independent         Comments: intermittent use of her sister's cane     Hand Dominance   Dominant Hand: Right    Extremity/Trunk Assessment   Upper Extremity Assessment Upper Extremity Assessment: Overall WFL for tasks assessed    Lower Extremity Assessment Lower Extremity Assessment: RLE deficits/detail RLE Deficits / Details: pt with good quad set strength and B 4+/5 dorsi/planatr flexion strength. Pt able to complete full SLR RLE Sensation: WNL RLE Coordination: WNL    Cervical / Trunk Assessment Cervical / Trunk Assessment: Normal  Communication   Communication: No difficulties  Cognition Arousal/Alertness: Awake/alert Behavior During Therapy: WFL for tasks assessed/performed Overall Cognitive Status: Within Functional Limits for tasks assessed                                        General Comments      Exercises Total Joint Exercises Ankle Circles/Pumps: AROM;Both;20 reps;Seated   Assessment/Plan    PT Assessment Patient needs continued PT services  PT Problem List Decreased strength;Decreased range of motion;Decreased activity tolerance;Decreased balance;Decreased mobility;Decreased knowledge of use of DME;Pain       PT Treatment Interventions DME instruction;Gait training;Stair training;Functional mobility training;Therapeutic activities;Therapeutic exercise;Balance training;Patient/family education    PT Goals (Current goals can be found in the Care Plan section)  Acute Rehab PT Goals Patient Stated 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    Co-evaluation               AM-PAC PT "6 Clicks" Mobility  Outcome Measure Help needed turning from your back to your side while in a flat bed without using bedrails?: None Help needed moving from lying on your back to sitting on the side of a flat bed without using bedrails?: None Help needed moving to and from a bed to a chair (including a wheelchair)?: A Little Help needed standing up from a chair using your arms (e.g., wheelchair or bedside chair)?: A Little Help needed to walk in hospital room?: A Little Help needed climbing 3-5 steps with a railing? : A Lot 6 Click Score: 19    End of Session Equipment Utilized During Treatment: Gait belt Activity Tolerance: Patient tolerated treatment well Patient left: in chair;with call bell/phone within reach;with chair alarm set Nurse Communication: Mobility status (pt increased pain levels following session) PT Visit Diagnosis: Other abnormalities of gait and mobility (R26.89);Muscle weakness (generalized) (M62.81);Pain Pain - Right/Left: Right Pain - part of body: Knee    Time: 2595-6387 PT Time Calculation (min) (ACUTE ONLY): 23 min   Charges:  Elna Breslow, SPT  Acute rehab    Elna Breslow 12/26/2020, 2:05 PM

## 2020-12-26 NOTE — Discharge Instructions (Signed)
INSTRUCTIONS AFTER JOINT REPLACEMENT   o Remove items at home which could result in a fall. This includes throw rugs or furniture in walking pathways o ICE to the affected joint every three hours while awake for 30 minutes at a time, for at least the first 3-5 days, and then as needed for pain and swelling.  Continue to use ice for pain and swelling. You may notice swelling that will progress down to the foot and ankle.  This is normal after surgery.  Elevate your leg when you are not up walking on it.   o Continue to use the breathing machine you got in the hospital (incentive spirometer) which will help keep your temperature down.  It is common for your temperature to cycle up and down following surgery, especially at night when you are not up moving around and exerting yourself.  The breathing machine keeps your lungs expanded and your temperature down.   DIET:  As you were doing prior to hospitalization, we recommend a well-balanced diet.  DRESSING / WOUND CARE / SHOWERING  Keep the surgical dressing until follow up.  The dressing is water proof, so you can shower without any extra covering.  IF THE DRESSING FALLS OFF or the wound gets wet inside, change the dressing with sterile gauze.  Please use good hand washing techniques before changing the dressing.  Do not use any lotions or creams on the incision until instructed by your surgeon.    ACTIVITY  o Increase activity slowly as tolerated, but follow the weight bearing instructions below.   o No driving for 6 weeks or until further direction given by your physician.  You cannot drive while taking narcotics.  o No lifting or carrying greater than 10 lbs. until further directed by your surgeon. o Avoid periods of inactivity such as sitting longer than an hour when not asleep. This helps prevent blood clots.  o You may return to work once you are authorized by your doctor.     WEIGHT BEARING   Weight bearing as tolerated with assist  device (walker, cane, etc) as directed, use it as long as suggested by your surgeon or therapist, typically at least 4-6 weeks.   EXERCISES  Results after joint replacement surgery are often greatly improved when you follow the exercise, range of motion and muscle strengthening exercises prescribed by your doctor. Safety measures are also important to protect the joint from further injury. Any time any of these exercises cause you to have increased pain or swelling, decrease what you are doing until you are comfortable again and then slowly increase them. If you have problems or questions, call your caregiver or physical therapist for advice.   Rehabilitation is important following a joint replacement. After just a few days of immobilization, the muscles of the leg can become weakened and shrink (atrophy).  These exercises are designed to build up the tone and strength of the thigh and leg muscles and to improve motion. Often times heat used for twenty to thirty minutes before working out will loosen up your tissues and help with improving the range of motion but do not use heat for the first two weeks following surgery (sometimes heat can increase post-operative swelling).   These exercises can be done on a training (exercise) mat, on the floor, on a table or on a bed. Use whatever works the best and is most comfortable for you.    Use music or television while you are exercising so that   the exercises are a pleasant break in your day. This will make your life better with the exercises acting as a break in your routine that you can look forward to.   Perform all exercises about fifteen times, three times per day or as directed.  You should exercise both the operative leg and the other leg as well.  Exercises include:   . Quad Sets - Tighten up the muscle on the front of the thigh (Quad) and hold for 5-10 seconds.   . Straight Leg Raises - With your knee straight (if you were given a brace, keep it on),  lift the leg to 60 degrees, hold for 3 seconds, and slowly lower the leg.  Perform this exercise against resistance later as your leg gets stronger.  . Leg Slides: Lying on your back, slowly slide your foot toward your buttocks, bending your knee up off the floor (only go as far as is comfortable). Then slowly slide your foot back down until your leg is flat on the floor again.  . Angel Wings: Lying on your back spread your legs to the side as far apart as you can without causing discomfort.  . Hamstring Strength:  Lying on your back, push your heel against the floor with your leg straight by tightening up the muscles of your buttocks.  Repeat, but this time bend your knee to a comfortable angle, and push your heel against the floor.  You may put a pillow under the heel to make it more comfortable if necessary.   A rehabilitation program following joint replacement surgery can speed recovery and prevent re-injury in the future due to weakened muscles. Contact your doctor or a physical therapist for more information on knee rehabilitation.    CONSTIPATION  Constipation is defined medically as fewer than three stools per week and severe constipation as less than one stool per week.  Even if you have a regular bowel pattern at home, your normal regimen is likely to be disrupted due to multiple reasons following surgery.  Combination of anesthesia, postoperative narcotics, change in appetite and fluid intake all can affect your bowels.   YOU MUST use at least one of the following options; they are listed in order of increasing strength to get the job done.  They are all available over the counter, and you may need to use some, POSSIBLY even all of these options:    Drink plenty of fluids (prune juice may be helpful) and high fiber foods Colace 100 mg by mouth twice a day  Senokot for constipation as directed and as needed Dulcolax (bisacodyl), take with full glass of water  Miralax (polyethylene glycol)  once or twice a day as needed.  If you have tried all these things and are unable to have a bowel movement in the first 3-4 days after surgery call either your surgeon or your primary doctor.    If you experience loose stools or diarrhea, hold the medications until you stool forms back up.  If your symptoms do not get better within 1 week or if they get worse, check with your doctor.  If you experience "the worst abdominal pain ever" or develop nausea or vomiting, please contact the office immediately for further recommendations for treatment.   ITCHING:  If you experience itching with your medications, try taking only a single pain pill, or even half a pain pill at a time.  You can also use Benadryl over the counter for itching or also to   help with sleep.   TED HOSE STOCKINGS:  Use stockings on both legs until for at least 2 weeks or as directed by physician office. They may be removed at night for sleeping.  MEDICATIONS:  See your medication summary on the "After Visit Summary" that nursing will review with you.  You may have some home medications which will be placed on hold until you complete the course of blood thinner medication.  It is important for you to complete the blood thinner medication as prescribed.  PRECAUTIONS:  If you experience chest pain or shortness of breath - call 911 immediately for transfer to the hospital emergency department.   If you develop a fever greater that 101 F, purulent drainage from wound, increased redness or drainage from wound, foul odor from the wound/dressing, or calf pain - CONTACT YOUR SURGEON.                                                   FOLLOW-UP APPOINTMENTS:  If you do not already have a post-op appointment, please call the office for an appointment to be seen by your surgeon.  Guidelines for how soon to be seen are listed in your "After Visit Summary", but are typically between 1-4 weeks after surgery.  OTHER INSTRUCTIONS:   Knee  Replacement:  Do not place pillow under knee, focus on keeping the knee straight while resting.   POST-OPERATIVE OPIOID TAPER INSTRUCTIONS: . It is important to wean off of your opioid medication as soon as possible. If you do not need pain medication after your surgery it is ok to stop day one. Marland Kitchen Opioids include: o Codeine, Hydrocodone(Norco, Vicodin), Oxycodone(Percocet, oxycontin) and hydromorphone amongst others.  . Long term and even short term use of opiods can cause: o Increased pain response o Dependence o Constipation o Depression o Respiratory depression o And more.  . Withdrawal symptoms can include o Flu like symptoms o Nausea, vomiting o And more . Techniques to manage these symptoms o Hydrate well o Eat regular healthy meals o Stay active o Use relaxation techniques(deep breathing, meditating, yoga) . Do Not substitute Alcohol to help with tapering . If you have been on opioids for less than two weeks and do not have pain than it is ok to stop all together.  . Plan to wean off of opioids o This plan should start within one week post op of your joint replacement. o Maintain the same interval or time between taking each dose and first decrease the dose.  o Cut the total daily intake of opioids by one tablet each day o Next start to increase the time between doses. o The last dose that should be eliminated is the evening dose.     MAKE SURE YOU:  . Understand these instructions.  . Get help right away if you are not doing well or get worse.    Thank you for letting us be a part of your medical care team.  It is a privilege we respect greatly.  We hope these instructions will help you stay on track for a fast and full recovery!

## 2020-12-27 ENCOUNTER — Encounter (HOSPITAL_COMMUNITY): Payer: Self-pay | Admitting: Orthopedic Surgery

## 2020-12-27 DIAGNOSIS — M1711 Unilateral primary osteoarthritis, right knee: Secondary | ICD-10-CM | POA: Diagnosis not present

## 2020-12-27 DIAGNOSIS — Z79899 Other long term (current) drug therapy: Secondary | ICD-10-CM | POA: Diagnosis not present

## 2020-12-27 LAB — CBC
HCT: 35.3 % — ABNORMAL LOW (ref 36.0–46.0)
Hemoglobin: 11.2 g/dL — ABNORMAL LOW (ref 12.0–15.0)
MCH: 29.2 pg (ref 26.0–34.0)
MCHC: 31.7 g/dL (ref 30.0–36.0)
MCV: 91.9 fL (ref 80.0–100.0)
Platelets: 284 10*3/uL (ref 150–400)
RBC: 3.84 MIL/uL — ABNORMAL LOW (ref 3.87–5.11)
RDW: 12.4 % (ref 11.5–15.5)
WBC: 15.1 10*3/uL — ABNORMAL HIGH (ref 4.0–10.5)
nRBC: 0 % (ref 0.0–0.2)

## 2020-12-27 LAB — BASIC METABOLIC PANEL
Anion gap: 9 (ref 5–15)
BUN: 12 mg/dL (ref 8–23)
CO2: 24 mmol/L (ref 22–32)
Calcium: 8.9 mg/dL (ref 8.9–10.3)
Chloride: 106 mmol/L (ref 98–111)
Creatinine, Ser: 0.64 mg/dL (ref 0.44–1.00)
GFR, Estimated: >60 mL/min (ref >60)
Glucose, Bld: 116 mg/dL — ABNORMAL HIGH (ref 70–99)
Potassium: 4.1 mmol/L (ref 3.5–5.1)
Sodium: 139 mmol/L (ref 135–145)

## 2020-12-27 MED ORDER — FERROUS SULFATE 325 (65 FE) MG PO TABS: 325.0000 mg | ORAL_TABLET | Freq: Three times a day (TID) | ORAL | 0 refills | Status: DC

## 2020-12-27 MED ORDER — DOCUSATE SODIUM 100 MG PO CAPS: 100.0000 mg | ORAL_CAPSULE | Freq: Two times a day (BID) | ORAL | 0 refills | Status: DC

## 2020-12-27 MED ORDER — POLYETHYLENE GLYCOL 3350 17 G PO PACK: 17.0000 g | PACK | Freq: Two times a day (BID) | ORAL | 0 refills | Status: DC

## 2020-12-27 MED ORDER — CELECOXIB 200 MG PO CAPS: 200.0000 mg | ORAL_CAPSULE | Freq: Two times a day (BID) | ORAL | 0 refills | Status: DC

## 2020-12-27 MED ORDER — HYDROCODONE-ACETAMINOPHEN 5-325 MG PO TABS: 1.0000 | ORAL_TABLET | ORAL | 0 refills | Status: DC | PRN

## 2020-12-27 MED ORDER — ASPIRIN 81 MG PO CHEW: 81.0000 mg | CHEWABLE_TABLET | Freq: Two times a day (BID) | ORAL | 0 refills | Status: AC

## 2020-12-27 MED ORDER — METHOCARBAMOL 500 MG PO TABS: 500.0000 mg | ORAL_TABLET | Freq: Four times a day (QID) | ORAL | 0 refills | Status: DC | PRN

## 2020-12-27 NOTE — Progress Notes (Signed)
     Subjective: 1 Day Post-Op Procedure(s) (LRB): TOTAL KNEE ARTHROPLASTY (Right)   Seen by Dr. Alvan Dame Patient reports pain as mild, pain controlled.  No reported events throughout the night.  Dr. Alvan Dame discussed the procedure, findings and expectations moving forward.  Ready to be discharged home, if they do well with PT.  Follow up in the clinic in 2 weeks.  Knows to call with any questions or concerns.     Patient's anticipated LOS is less than 2 midnights, meeting these requirements: - Lives within 1 hour of care - Has a competent adult at home to recover with post-op recover - NO history of  - Chronic pain requiring opiods  - Diabetes  - Coronary Artery Disease  - Heart failure  - Heart attack  - Stroke  - DVT/VTE  - Cardiac arrhythmia  - Respiratory Failure/COPD  - Renal failure  - Anemia  - Advanced Liver disease       Objective:   VITALS:   Vitals:   12/27/20 0135 12/27/20 0515  BP: 121/73 125/69  Pulse: 65 64  Resp: 14 14  Temp: (!) 97.5 F (36.4 C) 98.3 F (36.8 C)  SpO2: 98% 99%    Dorsiflexion/Plantar flexion intact Incision: dressing C/D/I No cellulitis present Compartment soft  LABS Recent Labs    12/27/20 0311  HGB 11.2*  HCT 35.3*  WBC 15.1*  PLT 284    Recent Labs    12/27/20 0311  NA 139  K 4.1  BUN 12  CREATININE 0.64  GLUCOSE 116*     Assessment/Plan: 1 Day Post-Op Procedure(s) (LRB): TOTAL KNEE ARTHROPLASTY (Right)  Foley cath d/c'ed Advance diet Up with therapy D/C IV fluids Discharge home Follow up in 2 weeks at [facility] Follow up with OLIN,Lige Lakeman D in 2 weeks.  Contact information:  [facility] [address], [address] [place] [phone]          Danae Orleans PA-C  [facility]  [place] [address], [place], [place], [place] [address] Phone: [phone] [email or web address] Facebook  [employer]

## 2020-12-27 NOTE — Progress Notes (Signed)
Physical Therapy Treatment Patient Details Name: Melanie Lee MRN: [id number] DOB: 17-Jul-1946 Today's Date: 12/27/2020    History of Present Illness Patient is 75 y.o. female s/p Rt TKA on 12/26/20 with PMH significant for OA, HTN, Lt TKA in 2017.    PT Comments    Progressing well with mobility. Reviewed/practiced exercises, gait training, and stair training. Issued HEP for pt to perform until she begins OP PT. All education completed. Okay to d/c from PT standpoint.    Follow Up Recommendations  Follow surgeon's recommendation for DC plan and follow-up therapies     Equipment Recommendations  Rolling walker with 5" wheels    Recommendations for Other Services       Precautions / Restrictions Precautions Precautions: Fall Restrictions Weight Bearing Restrictions: No Other Position/Activity Restrictions: WBAT    Mobility  Bed Mobility               General bed mobility comments: oob in recliner    Transfers Overall transfer level: Needs assistance Equipment used: Rolling walker (2 wheeled) Transfers: Sit to/from Stand Sit to Stand: Min guard         General transfer comment: Cues for safety, hand placement.  Ambulation/Gait Ambulation/Gait assistance: Min guard Gait Distance (Feet): 75 Feet Assistive device: Rolling walker (2 wheeled) Gait Pattern/deviations: Step-to pattern;Step-through pattern;Decreased stride length;Trunk flexed     General Gait Details: Cues for safety, posture, RW proximity. Slow gait speed. No c/o lightheadedness. Min guard for safety.   Stairs Stairs: Yes Min guard Assist Stair Management: Step to pattern;Forwards;Two rails Number of Stairs: 2 General stair comments: up and over portable stairs x 1. cues for safety, technique, sequence. min guard for safety.   Wheelchair Mobility    Modified Rankin (Stroke Patients Only)       Balance Overall balance assessment: Needs assistance         Standing balance  support: Bilateral upper extremity supported Standing balance-Leahy Scale: Fair                              Cognition Arousal/Alertness: Awake/alert Behavior During Therapy: WFL for tasks assessed/performed Overall Cognitive Status: Within Functional Limits for tasks assessed                                        Exercises Total Joint Exercises Ankle Circles/Pumps: AROM;Both;10 reps;Seated Quad Sets: AROM;Both;10 reps;Seated Hip ABduction/ADduction: AROM;Right;10 reps;Seated Straight Leg Raises: AROM;Right;10 reps;Seated Knee Flexion: AROM;Right;10 reps;Seated Goniometric ROM: ~5-90 degrees    General Comments        Pertinent Vitals/Pain Pain Assessment: 0-10 Pain Score: 5  Pain Location: R knee Pain Descriptors / Indicators: Discomfort;Sore Pain Intervention(s): Monitored during session;Repositioned;Ice applied    Home Living                      Prior Function            PT Goals (current goals can now be found in the care plan section) Progress towards PT goals: Progressing toward goals    Frequency    7X/week      PT Plan Current plan remains appropriate    Co-evaluation              AM-PAC PT "6 Clicks" Mobility   Outcome Measure  Help needed turning from your back to your side  while in a flat bed without using bedrails?: None Help needed moving from lying on your back to sitting on the side of a flat bed without using bedrails?: None Help needed moving to and from a bed to a chair (including a wheelchair)?: A Little Help needed standing up from a chair using your arms (e.g., wheelchair or bedside chair)?: A Little Help needed to walk in hospital room?: A Little Help needed climbing 3-5 steps with a railing? : A Little 6 Click Score: 20    End of Session Equipment Utilized During Treatment: Gait belt Activity Tolerance: Patient tolerated treatment well Patient left: in chair;with call bell/phone  within reach;with chair alarm set   PT Visit Diagnosis: Other abnormalities of gait and mobility (R26.89);Muscle weakness (generalized) (M62.81);Pain Pain - Right/Left: Right Pain - part of body: Knee     Time: 0174-9449 PT Time Calculation (min) (ACUTE ONLY): 26 min  Charges:  $Gait Training: 8-22 mins $Therapeutic Exercise: 8-22 mins                         Doreatha Massed, PT Acute Rehabilitation  Office: [phone] Pager: [phone]

## 2020-12-27 NOTE — Discharge Summary (Signed)
Patient ID: Melanie Lee MRN: [id number] DOB/AGE: 1946/01/07 75 y.o.  Admit date: 12/26/2020 Discharge date: 12/27/2020  Admission Diagnoses:  Principal Problem:   Osteoarthritis of right knee Active Problems:   Status post total right knee replacement   Discharge Diagnoses:  Same  Past Medical History:  Diagnosis Date  . Elevated blood pressure (not hypertension)    "elevates sometimes with MD visits"  . H/O measles   . Osteoarthritis (arthritis due to wear and tear of joints)    Bilateral knees    Surgeries: Procedure(s): RIGHT TOTAL KNEE ARTHROPLASTY on 12/26/2020   Consultants: N/A  Discharged Condition: Improved  Hospital Course: Dalaya Suppa is an 75 y.o. female who was admitted 12/26/2020 for operative treatment of Osteoarthritis of right knee. Patient has severe unremitting pain that affects sleep, daily activities, and work/hobbies. After pre-op clearance the patient was taken to the operating room on 12/26/2020 and underwent  Procedure(s): RIGHT TOTAL KNEE ARTHROPLASTY.    Patient was given perioperative antibiotics:  Anti-infectives (From admission, onward)   Start     Dose/Rate Route Frequency Ordered Stop   12/26/20 1330  ceFAZolin (ANCEF) IVPB 2g/100 mL premix        2 g 200 mL/hr over 30 Minutes Intravenous Every 6 hours 12/26/20 1008 12/26/20 2131   12/26/20 0600  ceFAZolin (ANCEF) IVPB 2g/100 mL premix        2 g 200 mL/hr over 30 Minutes Intravenous On call to O.R. 12/26/20 0538 12/26/20 0745   12/26/20 0542  ceFAZolin (ANCEF) 2-4 GM/100ML-% IVPB       Note to Pharmacy: Charmayne Sheer   : cabinet override      12/26/20 0542 12/26/20 0734       Patient was given sequential compression devices, early ambulation, and chemoprophylaxis to prevent DVT.  Patient benefited maximally from hospital stay and there were no complications.    Recent vital signs:  Patient Vitals for the past 24 hrs:  BP Temp Temp src Pulse Resp SpO2 Height Weight  12/27/20  0515 125/69 98.3 F (36.8 C) -- 64 14 99 % -- --  12/27/20 0135 121/73 (!) 97.5 F (36.4 C) Oral 65 14 98 % -- --  12/26/20 2129 129/76 (!) 97.5 F (36.4 C) Oral 64 14 98 % -- --  12/26/20 1739 126/79 (!) 97.5 F (36.4 C) Oral 65 18 99 % -- --  12/26/20 1223 136/83 97.9 F (36.6 C) Oral 74 18 100 % -- --  12/26/20 1010 133/75 97.8 F (36.6 C) Oral 74 16 97 % 5\' 5"  (1.651 m) 65.8 kg  12/26/20 0945 122/71 -- -- 72 17 98 % -- --  12/26/20 0930 122/74 -- -- 76 15 99 % -- --  12/26/20 0915 133/72 -- -- 81 15 100 % -- --  12/26/20 0909 127/73 (!) 97.5 F (36.4 C) -- 80 19 100 % -- --     Recent laboratory studies:  Recent Labs    12/27/20 0311  WBC 15.1*  HGB 11.2*  HCT 35.3*  PLT 284  NA 139  K 4.1  CL 106  CO2 24  BUN 12  CREATININE 0.64  GLUCOSE 116*  CALCIUM 8.9     Discharge Medications:   Allergies as of 12/27/2020      Reactions   Tramadol Nausea Only, Other (See Comments)   Dizziness      Medication List    STOP taking these medications   meloxicam 7.5 MG tablet Commonly known as: MOBIC  TAKE these medications   aspirin 81 MG chewable tablet Commonly known as: Aspirin Childrens Chew 1 tablet (81 mg total) by mouth 2 (two) times daily. Take for 4 weeks, then resume regular dose.   celecoxib 200 MG capsule Commonly known as: CeleBREX Take 1 capsule (200 mg total) by mouth 2 (two) times daily.   conjugated estrogens vaginal cream Commonly known as: PREMARIN Place 1 Applicatorful vaginally every Monday, Wednesday, and Friday.   docusate sodium 100 MG capsule Commonly known as: Colace Take 1 capsule (100 mg total) by mouth 2 (two) times daily.   ferrous sulfate 325 (65 FE) MG tablet Commonly known as: FerrouSul Take 1 tablet (325 mg total) by mouth 3 (three) times daily with meals for 14 days.   fluticasone 50 MCG/ACT nasal spray Commonly known as: FLONASE Place 2 sprays into both nostrils daily. What changed:   when to take this  reasons  to take this   HYDROcodone-acetaminophen 5-325 MG tablet Commonly known as: Norco Take 1-2 tablets by mouth every 4 (four) hours as needed for moderate pain or severe pain. What changed:   how much to take  when to take this  reasons to take this   methocarbamol 500 MG tablet Commonly known as: Robaxin Take 1 tablet (500 mg total) by mouth every 6 (six) hours as needed for muscle spasms.   Multivitamin Adults 50+ Tabs Take 1 tablet by mouth daily.   polyethylene glycol 17 g packet Commonly known as: MIRALAX / GLYCOLAX Take 17 g by mouth 2 (two) times daily.   pregabalin 25 MG capsule Commonly known as: LYRICA Take 25 mg by mouth at bedtime.   rosuvastatin 10 MG tablet Commonly known as: Crestor Take 1 tablet (10 mg total) by mouth daily. Dc pravastatin if crestor covered/filled.   Vitamin D (Ergocalciferol) 1.25 MG (50000 UNIT) Caps capsule Commonly known as: DRISDOL Take 1 capsule (50,000 Units total) by mouth every 7 (seven) days.            Discharge Care Instructions  (From admission, onward)         Start     Ordered   12/27/20 0000  Change dressing       Comments: Maintain surgical dressing until follow up in the clinic. If the edges start to pull up, may reinforce with tape. If the dressing is no longer working, may remove and cover with gauze and tape, but must keep the area dry and clean.  Call with any questions or concerns.   12/27/20 0808          Diagnostic Studies: No results found.  Disposition: Home  Discharge Instructions    Call MD / Call 911   Complete by: As directed    If you experience chest pain or shortness of breath, CALL 911 and be transported to the hospital emergency room.  If you develope a fever above 101 F, pus (white drainage) or increased drainage or redness at the wound, or calf pain, call your surgeon's office.   Change dressing   Complete by: As directed    Maintain surgical dressing until follow up in the clinic. If  the edges start to pull up, may reinforce with tape. If the dressing is no longer working, may remove and cover with gauze and tape, but must keep the area dry and clean.  Call with any questions or concerns.   Constipation Prevention   Complete by: As directed    Drink plenty of fluids.  Prune juice may be helpful.  You may use a stool softener, such as Colace (over the counter) 100 mg twice a day.  Use MiraLax (over the counter) for constipation as needed.   Diet - low sodium heart healthy   Complete by: As directed    Discharge instructions   Complete by: As directed    Maintain surgical dressing until follow up in the clinic. If the edges start to pull up, may reinforce with tape. If the dressing is no longer working, may remove and cover with gauze and tape, but must keep the area dry and clean.  Follow up in 2 weeks at [facility]. Call with any questions or concerns.   Increase activity slowly as tolerated   Complete by: As directed    Weight bearing as tolerated with assist device (walker, cane, etc) as directed, use it as long as suggested by your surgeon or therapist, typically at least 4-6 weeks.   TED hose   Complete by: As directed    Use stockings (TED hose) for 2 weeks on both leg(s).  You may remove them at night for sleeping.       Follow-up Information    Paralee Cancel, MD. Schedule an appointment as soon as possible for a visit in 2 weeks.   Specialty: Orthopedic Surgery Contact information: [address] [place] [address] [phone]                Signed: [facility] 12/27/2020, 8:09 AM

## 2020-12-27 NOTE — TOC Transition Note (Signed)
Transition of Care [facility]) - CM/SW Discharge Note  Patient Details  Name: Caylie Sandquist MRN: [id number] Date of Birth: 10-Nov-1945  [facility]) CM/SW Contact:  Sherie Don, LCSW Phone Number: 12/27/2020, 11:40 AM  Clinical Narrative: Patient to discharge home today. CSW met with patient to review DME and OPPT needs. Per patient, she declined the walker and 3N1 from [facility] as she does not want to pay out of pocket for DME. Patient reported she will be going to OPPT in [facility]. TOC signing off.  Final next level of care: OP Rehab Barriers to Discharge: Barriers Resolved  Patient Goals and CMS Choice Patient states their goals for this hospitalization and ongoing recovery are:: Discharge home CMS Medicare.gov Compare Post Acute Care list provided to:: Patient Choice offered to / list presented to : Patient  Readmission Risk Interventions No flowsheet data found.

## 2021-01-12 DIAGNOSIS — Z96651 Presence of right artificial knee joint: Secondary | ICD-10-CM | POA: Diagnosis not present

## 2021-01-12 DIAGNOSIS — M25661 Stiffness of right knee, not elsewhere classified: Secondary | ICD-10-CM | POA: Diagnosis not present

## 2021-01-12 DIAGNOSIS — Z7409 Other reduced mobility: Secondary | ICD-10-CM | POA: Diagnosis not present

## 2021-01-12 DIAGNOSIS — Z471 Aftercare following joint replacement surgery: Secondary | ICD-10-CM | POA: Diagnosis not present

## 2021-01-12 DIAGNOSIS — R269 Unspecified abnormalities of gait and mobility: Secondary | ICD-10-CM | POA: Diagnosis not present

## 2021-01-12 DIAGNOSIS — M75102 Unspecified rotator cuff tear or rupture of left shoulder, not specified as traumatic: Secondary | ICD-10-CM | POA: Diagnosis not present

## 2021-01-12 DIAGNOSIS — M1612 Unilateral primary osteoarthritis, left hip: Secondary | ICD-10-CM | POA: Diagnosis not present

## 2021-01-12 DIAGNOSIS — M1711 Unilateral primary osteoarthritis, right knee: Secondary | ICD-10-CM | POA: Diagnosis not present

## 2021-01-12 DIAGNOSIS — R531 Weakness: Secondary | ICD-10-CM | POA: Diagnosis not present

## 2021-01-16 DIAGNOSIS — M25661 Stiffness of right knee, not elsewhere classified: Secondary | ICD-10-CM | POA: Diagnosis not present

## 2021-01-16 DIAGNOSIS — R269 Unspecified abnormalities of gait and mobility: Secondary | ICD-10-CM | POA: Diagnosis not present

## 2021-01-16 DIAGNOSIS — M75102 Unspecified rotator cuff tear or rupture of left shoulder, not specified as traumatic: Secondary | ICD-10-CM | POA: Diagnosis not present

## 2021-01-16 DIAGNOSIS — Z96651 Presence of right artificial knee joint: Secondary | ICD-10-CM | POA: Diagnosis not present

## 2021-01-16 DIAGNOSIS — M1612 Unilateral primary osteoarthritis, left hip: Secondary | ICD-10-CM | POA: Diagnosis not present

## 2021-01-16 DIAGNOSIS — Z7409 Other reduced mobility: Secondary | ICD-10-CM | POA: Diagnosis not present

## 2021-01-16 DIAGNOSIS — Z471 Aftercare following joint replacement surgery: Secondary | ICD-10-CM | POA: Diagnosis not present

## 2021-01-16 DIAGNOSIS — R531 Weakness: Secondary | ICD-10-CM | POA: Diagnosis not present

## 2021-01-16 DIAGNOSIS — M1711 Unilateral primary osteoarthritis, right knee: Secondary | ICD-10-CM | POA: Diagnosis not present

## 2021-01-18 DIAGNOSIS — M1612 Unilateral primary osteoarthritis, left hip: Secondary | ICD-10-CM | POA: Diagnosis not present

## 2021-01-18 DIAGNOSIS — Z7409 Other reduced mobility: Secondary | ICD-10-CM | POA: Diagnosis not present

## 2021-01-18 DIAGNOSIS — R531 Weakness: Secondary | ICD-10-CM | POA: Diagnosis not present

## 2021-01-18 DIAGNOSIS — Z471 Aftercare following joint replacement surgery: Secondary | ICD-10-CM | POA: Diagnosis not present

## 2021-01-18 DIAGNOSIS — M75102 Unspecified rotator cuff tear or rupture of left shoulder, not specified as traumatic: Secondary | ICD-10-CM | POA: Diagnosis not present

## 2021-01-18 DIAGNOSIS — M25661 Stiffness of right knee, not elsewhere classified: Secondary | ICD-10-CM | POA: Diagnosis not present

## 2021-01-18 DIAGNOSIS — R269 Unspecified abnormalities of gait and mobility: Secondary | ICD-10-CM | POA: Diagnosis not present

## 2021-01-18 DIAGNOSIS — Z96651 Presence of right artificial knee joint: Secondary | ICD-10-CM | POA: Diagnosis not present

## 2021-01-18 DIAGNOSIS — M1711 Unilateral primary osteoarthritis, right knee: Secondary | ICD-10-CM | POA: Diagnosis not present

## 2021-01-23 DIAGNOSIS — M75102 Unspecified rotator cuff tear or rupture of left shoulder, not specified as traumatic: Secondary | ICD-10-CM | POA: Diagnosis not present

## 2021-01-23 DIAGNOSIS — M1711 Unilateral primary osteoarthritis, right knee: Secondary | ICD-10-CM | POA: Diagnosis not present

## 2021-01-23 DIAGNOSIS — Z7409 Other reduced mobility: Secondary | ICD-10-CM | POA: Diagnosis not present

## 2021-01-23 DIAGNOSIS — R531 Weakness: Secondary | ICD-10-CM | POA: Diagnosis not present

## 2021-01-23 DIAGNOSIS — R269 Unspecified abnormalities of gait and mobility: Secondary | ICD-10-CM | POA: Diagnosis not present

## 2021-01-23 DIAGNOSIS — Z96651 Presence of right artificial knee joint: Secondary | ICD-10-CM | POA: Diagnosis not present

## 2021-01-23 DIAGNOSIS — M25661 Stiffness of right knee, not elsewhere classified: Secondary | ICD-10-CM | POA: Diagnosis not present

## 2021-01-23 DIAGNOSIS — Z471 Aftercare following joint replacement surgery: Secondary | ICD-10-CM | POA: Diagnosis not present

## 2021-01-23 DIAGNOSIS — M1612 Unilateral primary osteoarthritis, left hip: Secondary | ICD-10-CM | POA: Diagnosis not present

## 2021-01-25 DIAGNOSIS — M75102 Unspecified rotator cuff tear or rupture of left shoulder, not specified as traumatic: Secondary | ICD-10-CM | POA: Diagnosis not present

## 2021-01-25 DIAGNOSIS — M1711 Unilateral primary osteoarthritis, right knee: Secondary | ICD-10-CM | POA: Diagnosis not present

## 2021-01-25 DIAGNOSIS — M1612 Unilateral primary osteoarthritis, left hip: Secondary | ICD-10-CM | POA: Diagnosis not present

## 2021-01-25 DIAGNOSIS — R531 Weakness: Secondary | ICD-10-CM | POA: Diagnosis not present

## 2021-01-25 DIAGNOSIS — R269 Unspecified abnormalities of gait and mobility: Secondary | ICD-10-CM | POA: Diagnosis not present

## 2021-01-25 DIAGNOSIS — Z471 Aftercare following joint replacement surgery: Secondary | ICD-10-CM | POA: Diagnosis not present

## 2021-01-25 DIAGNOSIS — Z96651 Presence of right artificial knee joint: Secondary | ICD-10-CM | POA: Diagnosis not present

## 2021-01-25 DIAGNOSIS — Z7409 Other reduced mobility: Secondary | ICD-10-CM | POA: Diagnosis not present

## 2021-01-25 DIAGNOSIS — M25661 Stiffness of right knee, not elsewhere classified: Secondary | ICD-10-CM | POA: Diagnosis not present

## 2021-01-30 DIAGNOSIS — Z96651 Presence of right artificial knee joint: Secondary | ICD-10-CM | POA: Diagnosis not present

## 2021-01-30 DIAGNOSIS — Z471 Aftercare following joint replacement surgery: Secondary | ICD-10-CM | POA: Diagnosis not present

## 2021-01-30 DIAGNOSIS — M75102 Unspecified rotator cuff tear or rupture of left shoulder, not specified as traumatic: Secondary | ICD-10-CM | POA: Diagnosis not present

## 2021-01-30 DIAGNOSIS — M1612 Unilateral primary osteoarthritis, left hip: Secondary | ICD-10-CM | POA: Diagnosis not present

## 2021-01-30 DIAGNOSIS — M25661 Stiffness of right knee, not elsewhere classified: Secondary | ICD-10-CM | POA: Diagnosis not present

## 2021-01-30 DIAGNOSIS — Z7409 Other reduced mobility: Secondary | ICD-10-CM | POA: Diagnosis not present

## 2021-01-30 DIAGNOSIS — R531 Weakness: Secondary | ICD-10-CM | POA: Diagnosis not present

## 2021-01-30 DIAGNOSIS — M1711 Unilateral primary osteoarthritis, right knee: Secondary | ICD-10-CM | POA: Diagnosis not present

## 2021-01-30 DIAGNOSIS — R269 Unspecified abnormalities of gait and mobility: Secondary | ICD-10-CM | POA: Diagnosis not present

## 2021-02-01 DIAGNOSIS — Z7409 Other reduced mobility: Secondary | ICD-10-CM | POA: Diagnosis not present

## 2021-02-01 DIAGNOSIS — R531 Weakness: Secondary | ICD-10-CM | POA: Diagnosis not present

## 2021-02-01 DIAGNOSIS — Z471 Aftercare following joint replacement surgery: Secondary | ICD-10-CM | POA: Diagnosis not present

## 2021-02-01 DIAGNOSIS — R269 Unspecified abnormalities of gait and mobility: Secondary | ICD-10-CM | POA: Diagnosis not present

## 2021-02-01 DIAGNOSIS — M75102 Unspecified rotator cuff tear or rupture of left shoulder, not specified as traumatic: Secondary | ICD-10-CM | POA: Diagnosis not present

## 2021-02-01 DIAGNOSIS — M25661 Stiffness of right knee, not elsewhere classified: Secondary | ICD-10-CM | POA: Diagnosis not present

## 2021-02-01 DIAGNOSIS — M1612 Unilateral primary osteoarthritis, left hip: Secondary | ICD-10-CM | POA: Diagnosis not present

## 2021-02-01 DIAGNOSIS — M1711 Unilateral primary osteoarthritis, right knee: Secondary | ICD-10-CM | POA: Diagnosis not present

## 2021-02-01 DIAGNOSIS — Z96651 Presence of right artificial knee joint: Secondary | ICD-10-CM | POA: Diagnosis not present

## 2021-02-06 ENCOUNTER — Encounter: Payer: Self-pay | Admitting: Neurology

## 2021-02-06 ENCOUNTER — Other Ambulatory Visit: Payer: Self-pay

## 2021-02-06 ENCOUNTER — Encounter: Payer: Self-pay | Admitting: Medical

## 2021-02-06 ENCOUNTER — Ambulatory Visit (INDEPENDENT_AMBULATORY_CARE_PROVIDER_SITE_OTHER): Payer: BC Managed Care – PPO | Admitting: Medical

## 2021-02-06 VITALS — BP 120/70 | HR 85 | Resp 18 | Ht 65.0 in | Wt 145.0 lb

## 2021-02-06 DIAGNOSIS — R42 Dizziness and giddiness: Secondary | ICD-10-CM | POA: Diagnosis not present

## 2021-02-06 DIAGNOSIS — Z7409 Other reduced mobility: Secondary | ICD-10-CM | POA: Diagnosis not present

## 2021-02-06 DIAGNOSIS — M75102 Unspecified rotator cuff tear or rupture of left shoulder, not specified as traumatic: Secondary | ICD-10-CM | POA: Diagnosis not present

## 2021-02-06 DIAGNOSIS — M1711 Unilateral primary osteoarthritis, right knee: Secondary | ICD-10-CM | POA: Diagnosis not present

## 2021-02-06 DIAGNOSIS — D649 Anemia, unspecified: Secondary | ICD-10-CM | POA: Diagnosis not present

## 2021-02-06 DIAGNOSIS — R531 Weakness: Secondary | ICD-10-CM | POA: Diagnosis not present

## 2021-02-06 DIAGNOSIS — Z471 Aftercare following joint replacement surgery: Secondary | ICD-10-CM | POA: Diagnosis not present

## 2021-02-06 DIAGNOSIS — E559 Vitamin D deficiency, unspecified: Secondary | ICD-10-CM | POA: Diagnosis not present

## 2021-02-06 DIAGNOSIS — R519 Headache, unspecified: Secondary | ICD-10-CM

## 2021-02-06 DIAGNOSIS — M25661 Stiffness of right knee, not elsewhere classified: Secondary | ICD-10-CM | POA: Diagnosis not present

## 2021-02-06 DIAGNOSIS — M1612 Unilateral primary osteoarthritis, left hip: Secondary | ICD-10-CM | POA: Diagnosis not present

## 2021-02-06 DIAGNOSIS — R269 Unspecified abnormalities of gait and mobility: Secondary | ICD-10-CM | POA: Diagnosis not present

## 2021-02-06 DIAGNOSIS — Z96651 Presence of right artificial knee joint: Secondary | ICD-10-CM | POA: Diagnosis not present

## 2021-02-06 LAB — COMPREHENSIVE METABOLIC PANEL
ALT: 12 U/L (ref 0–35)
AST: 20 U/L (ref 0–37)
Albumin: 4.6 g/dL (ref 3.5–5.2)
Alkaline Phosphatase: 86 U/L (ref 39–117)
BUN: 11 mg/dL (ref 6–23)
CO2: 25 mEq/L (ref 19–32)
Calcium: 10 mg/dL (ref 8.4–10.5)
Chloride: 106 mEq/L (ref 96–112)
Creatinine, Ser: 0.58 mg/dL (ref 0.40–1.20)
GFR: 90.16 mL/min (ref >60.00)
Glucose, Bld: 82 mg/dL (ref 70–99)
Potassium: 4.1 mEq/L (ref 3.5–5.1)
Sodium: 141 mEq/L (ref 135–145)
Total Bilirubin: 0.5 mg/dL (ref 0.2–1.2)
Total Protein: 8.1 g/dL (ref 6.0–8.3)

## 2021-02-06 LAB — CBC WITH DIFFERENTIAL/PLATELET
Basophils Absolute: 0 10*3/uL (ref 0.0–0.1)
Basophils Relative: 0.5 % (ref 0.0–3.0)
Eosinophils Absolute: 0.1 10*3/uL (ref 0.0–0.7)
Eosinophils Relative: 0.6 % (ref 0.0–5.0)
HCT: 39.9 % (ref 36.0–46.0)
Hemoglobin: 13 g/dL (ref 12.0–15.0)
Lymphocytes Relative: 16.5 % (ref 12.0–46.0)
Lymphs Abs: 1.6 10*3/uL (ref 0.7–4.0)
MCHC: 32.6 g/dL (ref 30.0–36.0)
MCV: 90.3 fl (ref 78.0–100.0)
Monocytes Absolute: 0.7 10*3/uL (ref 0.1–1.0)
Monocytes Relative: 7.4 % (ref 3.0–12.0)
Neutro Abs: 7.1 10*3/uL (ref 1.4–7.7)
Neutrophils Relative %: 75 % (ref 43.0–77.0)
Platelets: 424 10*3/uL — ABNORMAL HIGH (ref 150.0–400.0)
RBC: 4.41 Mil/uL (ref 3.87–5.11)
RDW: 13.5 % (ref 11.5–15.5)
WBC: 9.5 10*3/uL (ref 4.0–10.5)

## 2021-02-06 LAB — IRON: Iron: 62 ug/dL (ref 42–145)

## 2021-02-06 MED ORDER — CYCLOBENZAPRINE HCL 5 MG PO TABS: ORAL_TABLET | ORAL | 0 refills | Status: DC

## 2021-02-06 NOTE — Patient Instructions (Addendum)
Recent history of severe type headaches increased frequency over the last year.  Negative CT November 2021.  No headache presently and normal neurologic exam today.  Based on the high intensity headaches and increased frequency along with dizziness we will go ahead and try to get prior Auth for repeat CT of head.  Went ahead and placed referral to neurologist as well.  When you do get next headache that is severe try to call our office for same-day appointment.  Toradol 30 to 60 mg IM might be alternative treatment.  If unable to get scheduled same day then would recommend Tylenol to 325 mg, ibuprofen 400 mg and could use 1 Flexeril 5 mg tablet.  Rx advisement given.  Make sure that his Flexeril given that the family member be with her that day.  Sedation as a side effect.  You do have chronic hip and knee pain.  We will try to avoid overuse of Norco.  Sometimes narcotics can lead to rebound headaches.  When you run out of your current Norco tabs call our office and I can refill.  Hx of anemia on lab review. Will repeat cbc and iron level.  Vit D deficiency. Repeat vit D level today.  Follow up in 2 weeks or as needed

## 2021-02-06 NOTE — Progress Notes (Addendum)
Subjective:    Patient ID: Melanie Lee, female    DOB: [birth year], 75 y.o.   MRN: [id number]  HPI Pt in with some headaches.intermittently over past year.   Pt states her ha are about twice a month. When they occur HA are all day. Pt does not take any medication for HA. No light sensitivity. No sound sensitivity. Pt daughter will give her pain med if gets headache.   Pt has been using norco for knee pain post replacement. Pain when walks. Pt uses narcotic about every other day. She tries tylenol but not adequate. Pt daughter does not think that ha day after norco use. Does not think rebound HA on discussion.  Pt did have some rare occasional ha in past but this frequency is more. When does get ha feels dizzy.  Last Friday had ha 10/10. No ha presently.   On average pt describing higher level ha 8/10.   No bp checks when she gets ha.  Ct head without contrast 08-2020 showed.  IMPRESSION: Normal head CT.   On review ct done in ED. Pt thinks ha are worse than in past.    Review of Systems  Constitutional: Negative for chills, fatigue and fever.  HENT: Negative for congestion, facial swelling, sinus pressure, sinus pain, sneezing, sore throat and trouble swallowing.   Respiratory: Negative for cough, chest tightness, shortness of breath and wheezing.   Cardiovascular: Negative for chest pain and palpitations.  Gastrointestinal: Negative for abdominal pain.  Musculoskeletal: Negative for back pain and myalgias.       Rt hip and knee pain.  Skin: Negative for rash.  Neurological: Positive for dizziness and headaches.       But not not presently.  Psychiatric/Behavioral: Negative for behavioral problems, decreased concentration, sleep disturbance and suicidal ideas. The patient is not nervous/anxious.     Past Medical History:  Diagnosis Date  . Elevated blood pressure (not hypertension)    "elevates sometimes with MD visits"  . H/O measles   . Osteoarthritis (arthritis  due to wear and tear of joints)    Bilateral knees     Social History   Socioeconomic History  . Marital status: Single    Spouse name: Not on file  . Number of children: Not on file  . Years of education: Not on file  . Highest education level: Not on file  Occupational History  . Not on file  Tobacco Use  . Smoking status: Never Smoker  . Smokeless tobacco: Never Used  Vaping Use  . Vaping Use: Never used  Substance and Sexual Activity  . Alcohol use: Yes    Alcohol/week: 0.0 standard drinks    Comment: rare  . Drug use: No  . Sexual activity: Not Currently  Other Topics Concern  . 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   Social Determinants of Health   Financial Resource Strain: Not on file  Food Insecurity: Not on file  Transportation Needs: Not on file  Physical Activity: Not on file  Stress: Not on file  Social Connections: Not on file  Intimate Partner Violence: Not on file    Past Surgical History:  Procedure Laterality Date  . ABDOMINAL HYSTERECTOMY  1995   total for fibroids  . BREAST BIOPSY  1995   benign  . CATARACT EXTRACTION  2013 & 2015   Bilateral  . KNEE SURGERY     Left Arthroscopy  . 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/2016  Procedure: TOTAL KNEE ARTHROPLASTY;  Surgeon: Latanya Maudlin, MD;  Location: [facility];  Service: Orthopedics;  Laterality: Left;  . TOTAL KNEE ARTHROPLASTY Right 12/26/2020   Procedure: TOTAL KNEE ARTHROPLASTY;  Surgeon: Paralee Cancel, MD;  Location: [facility];  Service: Orthopedics;  Laterality: Right;  70 mins    Family History  Problem Relation Age of Onset  . Breast cancer Mother 73       Deceased  . Heart disease Mother   . Hypertension Mother   . Hypertension Father        Deceased  . Stroke Father   . Hypertension Sister   . Hypertension Brother   . Heart defect Daughter   . Hyperlipidemia Daughter   . Hypertension Daughter   . Vision loss Paternal Uncle   . Stroke Brother      Allergies  Allergen Reactions  . Tramadol Nausea Only and Other (See Comments)    Dizziness    Current Outpatient Medications on File Prior to Visit  Medication Sig Dispense Refill  . conjugated estrogens (PREMARIN) vaginal cream Place 1 Applicatorful vaginally every Monday, Wednesday, and Friday.    . docusate sodium (COLACE) 100 MG capsule Take 1 capsule (100 mg total) by mouth 2 (two) times daily. 28 capsule 0  . ferrous sulfate (FERROUSUL) 325 (65 FE) MG tablet Take 1 tablet (325 mg total) by mouth 3 (three) times daily with meals for 14 days. 42 tablet 0  . fluticasone (FLONASE) 50 MCG/ACT nasal spray Place 2 sprays into both nostrils daily. (Patient taking differently: Place 2 sprays into both nostrils daily as needed for allergies.) 16 g 1  . HYDROcodone-acetaminophen (NORCO) 5-325 MG tablet Take 1-2 tablets by mouth every 4 (four) hours as needed for moderate pain or severe pain. 60 tablet 0  . Multiple Vitamins-Minerals (MULTIVITAMIN ADULTS 50+) TABS Take 1 tablet by mouth daily.     . polyethylene glycol (MIRALAX / GLYCOLAX) 17 g packet Take 17 g by mouth 2 (two) times daily. 28 packet 0  . pregabalin (LYRICA) 25 MG capsule Take 25 mg by mouth at bedtime.    . rosuvastatin (CRESTOR) 10 MG tablet Take 1 tablet (10 mg total) by mouth daily. Dc pravastatin if crestor covered/filled. 90 tablet 1  . Vitamin D, Ergocalciferol, (DRISDOL) 1.25 MG (50000 UNIT) CAPS capsule Take 1 capsule (50,000 Units total) by mouth every 7 (seven) days. 12 capsule 1   No current facility-administered medications on file prior to visit.    BP (!) 147/65   Pulse 85   Resp 18   Ht 5\' 5"  (1.651 m)   Wt 145 lb (65.8 kg)   SpO2 97%   BMI 24.13 kg/m      Objective:   Physical Exam   General Mental Status- Alert. General Appearance- Not in acute distress.   Skin General: Color- Normal Color. Moisture- Normal Moisture.  Neck Carotid Arteries- Normal color. Moisture- Normal Moisture. No  carotid bruits. No JVD.  Chest and Lung Exam Auscultation: Breath Sounds:-Normal.  Cardiovascular Auscultation:Rythm- Regular. Murmurs & Other Heart Sounds:Auscultation of the heart reveals- No Murmurs.  Abdomen Inspection:-Inspeection Normal. Palpation/Percussion:Note:No mass. Palpation and Percussion of the abdomen reveal- Non Tender, Non Distended + BS, no rebound or guarding.    Neurologic Cranial Nerve exam:- CN III-XII intact(No nystagmus), symmetric smile. Drift Test:- No drift. Romberg Exam:- not done since gait poor post knee replacement and uses walker. Heal to Toe Gait exam:see above. Finger to Nose:- Normal/Intact Strength:- 5/5 equal and symmetric strength  both upper and lower extremities.     Assessment & Plan:  [id number]  Recent history of severe type headaches increased frequency over the last year.  Negative CT November 2021.  No headache presently and normal neurologic exam today.  Based on the high intensity headaches and increased frequency along with dizziness we will go ahead and try to get prior Auth for repeat CT of head.  Went ahead and placed referral to neurologist as well.  When you do get next headache that is severe try to call our office for same-day appointment.  Toradol 30 to 60 mg IM might be alternative treatment.  If unable to get scheduled same day then would recommend Tylenol to 325 mg, ibuprofen 400 mg and could use 1 Flexeril 5 mg tablet.  Rx advisement given.  Make sure that his Flexeril given that the family member be with her that day.  Sedation as a side effect.  You do have chronic hip and knee pain.  We will try to avoid overuse of Norco.  Sometimes narcotics can lead to rebound headaches.  When you run out of your current Norco tabs call our office and I can refill.  Hx of anemia on lab review. Will repeat cbc and iron level.  Vit D deficiency. Repeat vit D level today.  Follow up in 2 weeks or as needed  Time spent with  patient today was 41  minutes which consisted of chart review, discussing diagnosis, work up treatment and documentation.

## 2021-02-07 DIAGNOSIS — Z96651 Presence of right artificial knee joint: Secondary | ICD-10-CM | POA: Diagnosis not present

## 2021-02-07 DIAGNOSIS — Z471 Aftercare following joint replacement surgery: Secondary | ICD-10-CM | POA: Diagnosis not present

## 2021-02-08 ENCOUNTER — Telehealth: Payer: Self-pay | Admitting: Medical

## 2021-02-08 ENCOUNTER — Telehealth: Payer: Self-pay | Admitting: Family Medicine

## 2021-02-08 DIAGNOSIS — Z471 Aftercare following joint replacement surgery: Secondary | ICD-10-CM | POA: Diagnosis not present

## 2021-02-08 DIAGNOSIS — Z7409 Other reduced mobility: Secondary | ICD-10-CM | POA: Diagnosis not present

## 2021-02-08 DIAGNOSIS — R531 Weakness: Secondary | ICD-10-CM | POA: Diagnosis not present

## 2021-02-08 DIAGNOSIS — M1711 Unilateral primary osteoarthritis, right knee: Secondary | ICD-10-CM | POA: Diagnosis not present

## 2021-02-08 DIAGNOSIS — M25661 Stiffness of right knee, not elsewhere classified: Secondary | ICD-10-CM | POA: Diagnosis not present

## 2021-02-08 DIAGNOSIS — M75102 Unspecified rotator cuff tear or rupture of left shoulder, not specified as traumatic: Secondary | ICD-10-CM | POA: Diagnosis not present

## 2021-02-08 DIAGNOSIS — M1612 Unilateral primary osteoarthritis, left hip: Secondary | ICD-10-CM | POA: Diagnosis not present

## 2021-02-08 DIAGNOSIS — E559 Vitamin D deficiency, unspecified: Secondary | ICD-10-CM

## 2021-02-08 DIAGNOSIS — R269 Unspecified abnormalities of gait and mobility: Secondary | ICD-10-CM | POA: Diagnosis not present

## 2021-02-08 DIAGNOSIS — Z96651 Presence of right artificial knee joint: Secondary | ICD-10-CM | POA: Diagnosis not present

## 2021-02-08 MED ORDER — VITAMIN D (ERGOCALCIFEROL) 1.25 MG (50000 UNIT) PO CAPS: 50000.0000 [IU] | ORAL_CAPSULE | ORAL | 1 refills | Status: DC

## 2021-02-08 NOTE — Telephone Encounter (Signed)
Refill vit D sent to pt pharmacy.

## 2021-02-08 NOTE — Telephone Encounter (Signed)
Patient say Melanie Lee and was told that her Vitamin D, Ergocalciferol, (DRISDOL) 1.25 MG (50000 UNIT) CAPS capsule  Would be called in tot he pharamacy. No refill showing since 2021.   Medication:  Has the patient contacted their pharmacy? Yes.   (If no, request that the patient contact the pharmacy for the refill.) (If yes, when and what did the pharmacy advise?)  Preferred Pharmacy (with phone number or street name):   [facility] [place], [place] - [address]  [address], [place] [address]  Phone:  [phone] Fax:  [phone]  DEA #:  -- Agent: Please be advised that RX refills may take up to 3 business days. We ask that you follow-up with your pharmacy.

## 2021-02-08 NOTE — Telephone Encounter (Signed)
Refill vit d sent to pt pharmacy.

## 2021-02-09 LAB — VITAMIN D 1,25 DIHYDROXY
Vitamin D 1, 25 (OH)2 Total: 46 pg/mL (ref 18–72)
Vitamin D2 1, 25 (OH)2: 35 pg/mL
Vitamin D3 1, 25 (OH)2: 11 pg/mL

## 2021-02-10 ENCOUNTER — Other Ambulatory Visit: Payer: Self-pay

## 2021-02-10 ENCOUNTER — Ambulatory Visit (HOSPITAL_BASED_OUTPATIENT_CLINIC_OR_DEPARTMENT_OTHER)
Admission: RE | Admit: 2021-02-10 | Discharge: 2021-02-10 | Disposition: A | Discharge status: Home / Self Care | Payer: BC Managed Care – PPO | Source: Ambulatory Visit | Attending: Medical | Admitting: Medical

## 2021-02-10 DIAGNOSIS — R42 Dizziness and giddiness: Secondary | ICD-10-CM | POA: Diagnosis not present

## 2021-02-10 DIAGNOSIS — R519 Headache, unspecified: Secondary | ICD-10-CM | POA: Diagnosis not present

## 2021-02-13 DIAGNOSIS — M1612 Unilateral primary osteoarthritis, left hip: Secondary | ICD-10-CM | POA: Diagnosis not present

## 2021-02-13 DIAGNOSIS — M75102 Unspecified rotator cuff tear or rupture of left shoulder, not specified as traumatic: Secondary | ICD-10-CM | POA: Diagnosis not present

## 2021-02-13 DIAGNOSIS — M25661 Stiffness of right knee, not elsewhere classified: Secondary | ICD-10-CM | POA: Diagnosis not present

## 2021-02-13 DIAGNOSIS — Z7409 Other reduced mobility: Secondary | ICD-10-CM | POA: Diagnosis not present

## 2021-02-13 DIAGNOSIS — R531 Weakness: Secondary | ICD-10-CM | POA: Diagnosis not present

## 2021-02-14 ENCOUNTER — Other Ambulatory Visit: Payer: Self-pay | Admitting: Family Medicine

## 2021-02-14 ENCOUNTER — Telehealth: Payer: Self-pay | Admitting: Family Medicine

## 2021-02-14 MED ORDER — HYDROCODONE-ACETAMINOPHEN 5-325 MG PO TABS: 1.0000 | ORAL_TABLET | ORAL | 0 refills | Status: DC | PRN

## 2021-02-14 NOTE — Telephone Encounter (Signed)
Requesting:norco 5-325 mg  Contract: UDS:06/23/20 Last Visit:02/06/21 Next Visit:unknown Last Refill:12/27/20  Please Advise

## 2021-02-14 NOTE — Telephone Encounter (Signed)
done

## 2021-02-14 NOTE — Telephone Encounter (Signed)
Medication: HYDROcodone-acetaminophen (NORCO) 5-325 MG tablet   Has the patient contacted their pharmacy? No. (If no, request that the patient contact the pharmacy for the refill.) (If yes, when and what did the pharmacy advise?)  Preferred Pharmacy (with phone number or street name):  [facility] [place], [place] - [address]  [address], [place] [address]  Phone:  [phone] Fax:  [phone]  DEA #:  --  DAW Reason: --     Agent: Please be advised that RX refills may take up to 3 business days. We ask that you follow-up with your pharmacy.

## 2021-02-15 ENCOUNTER — Telehealth: Payer: Self-pay

## 2021-02-15 ENCOUNTER — Other Ambulatory Visit: Payer: Self-pay | Admitting: Family Medicine

## 2021-02-15 DIAGNOSIS — M1612 Unilateral primary osteoarthritis, left hip: Secondary | ICD-10-CM | POA: Diagnosis not present

## 2021-02-15 DIAGNOSIS — R531 Weakness: Secondary | ICD-10-CM | POA: Diagnosis not present

## 2021-02-15 DIAGNOSIS — M25661 Stiffness of right knee, not elsewhere classified: Secondary | ICD-10-CM | POA: Diagnosis not present

## 2021-02-15 DIAGNOSIS — M75102 Unspecified rotator cuff tear or rupture of left shoulder, not specified as traumatic: Secondary | ICD-10-CM | POA: Diagnosis not present

## 2021-02-15 DIAGNOSIS — Z7409 Other reduced mobility: Secondary | ICD-10-CM | POA: Diagnosis not present

## 2021-02-15 MED ORDER — HYDROCODONE-ACETAMINOPHEN 5-325 MG PO TABS: 1.0000 | ORAL_TABLET | ORAL | 0 refills | Status: DC | PRN

## 2021-02-15 NOTE — Telephone Encounter (Signed)
Not sure what this note is saying. The pharmacy will not fill? I have not seen her in over a year so she may need an appt before they will fill or she may need to call the surgeon for a refill

## 2021-02-15 NOTE — Telephone Encounter (Signed)
Pharmacy called in said they can not fill with the post-op note on it

## 2021-02-15 NOTE — Telephone Encounter (Signed)
done

## 2021-02-15 NOTE — Telephone Encounter (Signed)
Appointment made for 05/08/2021 for patient to see you

## 2021-02-15 NOTE — Telephone Encounter (Signed)
Pt's daughter called needed  medication. See below

## 2021-02-15 NOTE — Telephone Encounter (Signed)
caller left message earlier for the PA and checking to see if he will be calling her back they are checking to see if the order for her mother's Hydrocodone has been sent in.  Telephone: [phone]

## 2021-02-16 ENCOUNTER — Other Ambulatory Visit: Payer: Self-pay | Admitting: Family Medicine

## 2021-02-16 MED ORDER — HYDROCODONE-ACETAMINOPHEN 5-325 MG PO TABS: 1.0000 | ORAL_TABLET | ORAL | 0 refills | Status: DC | PRN

## 2021-02-16 NOTE — Telephone Encounter (Signed)
Tried to call pt , phone gave busy dial tone , meds called into pharmacy yesterday

## 2021-02-16 NOTE — Telephone Encounter (Signed)
Pharmacy faxed rx over and they need it not to have post-op rx in the note section.  Can you resend for Dr. Charlett Blake?  Order is pended.

## 2021-02-16 NOTE — Telephone Encounter (Signed)
Medication:HYDROcodone-acetaminophen (NORCO) 5-325 MG tablet [[id number]      Has the patient contacted their pharmacy? no (If no, request that the patient contact the pharmacy for the refill.) (If yes, when and what did the pharmacy advise?)    Preferred Pharmacy (with phone number or street name):  [facility] [place], [place] - [address] Phone:  [phone]  Fax:  [phone]         Agent: Please be advised that RX refills may take up to 3 business days. We ask that you follow-up with your pharmacy.

## 2021-02-20 DIAGNOSIS — M25661 Stiffness of right knee, not elsewhere classified: Secondary | ICD-10-CM | POA: Diagnosis not present

## 2021-02-20 DIAGNOSIS — R531 Weakness: Secondary | ICD-10-CM | POA: Diagnosis not present

## 2021-02-20 DIAGNOSIS — M1612 Unilateral primary osteoarthritis, left hip: Secondary | ICD-10-CM | POA: Diagnosis not present

## 2021-02-20 DIAGNOSIS — Z7409 Other reduced mobility: Secondary | ICD-10-CM | POA: Diagnosis not present

## 2021-02-20 DIAGNOSIS — M75102 Unspecified rotator cuff tear or rupture of left shoulder, not specified as traumatic: Secondary | ICD-10-CM | POA: Diagnosis not present

## 2021-02-21 DIAGNOSIS — N3281 Overactive bladder: Secondary | ICD-10-CM | POA: Diagnosis not present

## 2021-02-22 DIAGNOSIS — M75102 Unspecified rotator cuff tear or rupture of left shoulder, not specified as traumatic: Secondary | ICD-10-CM | POA: Diagnosis not present

## 2021-02-22 DIAGNOSIS — M1612 Unilateral primary osteoarthritis, left hip: Secondary | ICD-10-CM | POA: Diagnosis not present

## 2021-02-22 DIAGNOSIS — Z7409 Other reduced mobility: Secondary | ICD-10-CM | POA: Diagnosis not present

## 2021-02-22 DIAGNOSIS — R531 Weakness: Secondary | ICD-10-CM | POA: Diagnosis not present

## 2021-02-22 DIAGNOSIS — M25661 Stiffness of right knee, not elsewhere classified: Secondary | ICD-10-CM | POA: Diagnosis not present

## 2021-02-26 ENCOUNTER — Telehealth: Payer: Self-pay | Admitting: Family Medicine

## 2021-02-26 ENCOUNTER — Other Ambulatory Visit: Payer: Self-pay | Admitting: Family Medicine

## 2021-02-26 NOTE — Telephone Encounter (Signed)
Requesting: hydrocodone 5-325mg  Contract:06/23/2020 UDS: 06/23/2020 Last Visit: 02/06/2021 w/ Percell Miller  Next Visit: 05/08/21 Last Refill: 02/16/2021 #60 and 0RF Pt sig; 1-2 tab q4h prn  Please Advise

## 2021-02-26 NOTE — Telephone Encounter (Signed)
Medication:HYDROcodone-acetaminophen  Has the patient contacted their pharmacy? Yes.   (If no, request that the patient contact the pharmacy for the refill.) (If yes, when and what did the pharmacy advise?) call pcp for refill    Preferred Pharmacy (with phone number or street name):  [facility] [place], [place] - [address]  [address] [place] [address]  Phone:  [phone] Fax:  [phone]  Agent: Please be advised that RX refills may take up to 3 business days. We ask that you follow-up with your pharmacy.

## 2021-02-27 ENCOUNTER — Other Ambulatory Visit: Payer: Self-pay | Admitting: Family Medicine

## 2021-02-27 DIAGNOSIS — Z7409 Other reduced mobility: Secondary | ICD-10-CM | POA: Diagnosis not present

## 2021-02-27 DIAGNOSIS — M1612 Unilateral primary osteoarthritis, left hip: Secondary | ICD-10-CM | POA: Diagnosis not present

## 2021-02-27 DIAGNOSIS — R531 Weakness: Secondary | ICD-10-CM | POA: Diagnosis not present

## 2021-02-27 DIAGNOSIS — M25661 Stiffness of right knee, not elsewhere classified: Secondary | ICD-10-CM | POA: Diagnosis not present

## 2021-02-27 DIAGNOSIS — M75102 Unspecified rotator cuff tear or rupture of left shoulder, not specified as traumatic: Secondary | ICD-10-CM | POA: Diagnosis not present

## 2021-02-27 MED ORDER — HYDROCODONE-ACETAMINOPHEN 5-325 MG PO TABS: 1.0000 | ORAL_TABLET | Freq: Three times a day (TID) | ORAL | 0 refills | Status: DC | PRN

## 2021-02-27 NOTE — Telephone Encounter (Signed)
OK make sure she has an appt with me in the next few months. For now I recommend they schedule Tylenol ES (acetaminophen) 500 mg tabs 1 tab 3 x daily and the hydrocodone twice daily for now but her goal should be to get down to one hydrocodone or none over the next few months. For now she can take the hydrocodone twice a day and I will add a few extra tabs to her prescription and make it so she can take a 3rd tab on a really bad day but not every day. I will send in #75 now but that has to last a month

## 2021-02-27 NOTE — Telephone Encounter (Signed)
Left message on machine to call back  

## 2021-02-27 NOTE — Telephone Encounter (Signed)
Please call Levada Dy at [phone] (daughter)

## 2021-02-27 NOTE — Telephone Encounter (Signed)
Spoke with daughter and she stated that she has been giving the hydrocodone every day at least 2 tablets.  Patient takes for her knee pain because she had knee replacement in March and hip.  Patient has been in PT and also daughter stated that she has been pushing patient to be more active and walking, which both makes the patient hurts more.  With talking on the phone it was said by daughter that she puts it in her pill box and she uses if patient wakes up in the middle of the night.  I advised her that this should only be taken as needed and also I advised that should be lasting 30 days.

## 2021-02-28 NOTE — Telephone Encounter (Signed)
Daughter notified about note below.  She has ibuprofen the she will try, cause she wants to watch out for too much tylenol.  Patient has visit in July.

## 2021-03-01 DIAGNOSIS — M1612 Unilateral primary osteoarthritis, left hip: Secondary | ICD-10-CM | POA: Diagnosis not present

## 2021-03-01 DIAGNOSIS — Z7409 Other reduced mobility: Secondary | ICD-10-CM | POA: Diagnosis not present

## 2021-03-01 DIAGNOSIS — R531 Weakness: Secondary | ICD-10-CM | POA: Diagnosis not present

## 2021-03-01 DIAGNOSIS — M25661 Stiffness of right knee, not elsewhere classified: Secondary | ICD-10-CM | POA: Diagnosis not present

## 2021-03-01 DIAGNOSIS — M75102 Unspecified rotator cuff tear or rupture of left shoulder, not specified as traumatic: Secondary | ICD-10-CM | POA: Diagnosis not present

## 2021-03-05 NOTE — Progress Notes (Signed)
NEUROLOGY CONSULTATION NOTE  Melanie Lee MRN: [id number] DOB: 06-11-1946  Referring provider: Mackie Pai, PA-C Primary care provider: Penni Homans, MD  Reason for consult:  headache  Assessment/Plan:   1.  New onset headache over 50.  Unclear etiology.  1.  Will start gabapentin 100mg  at bedtime.  If no improvement in 2 months, she will contact me and we can increase dose to 200mg  at bedtime 2.  In case these may be migraines, will have her try samples of Ubrelvy.  I would favor avoiding triptans due to age/increased risk for MI and CVA.  Failed multiple OTC analgesics as well as tramadol.   3.  Check sed rate and MRA of head (to evaluate for aneurysm) 4.  Follow up 6 months.   Subjective:  Melanie Lee is a 75 year old female who presents for headache.  History supplemented by referring provider's notes.  Off and on for a year and a half.  She reports a moderate frontal pressure that lasts all day and occurs once or twice a month.  CT of head from 08/14/2020 personally reviewed was unremarkable.  She then sometimes started waking up with a similar but severe headache and associated spinning sensation.  Vertigo resolves once she gets up but headache lasts all day.  These occur about twice a month.  No associated nausea, vomiting, photophobia, phonophobia, visual disturbance, numbness or weakness.  Repeat CT head on 02/10/2021 personally reviewed was also unremarkable.   She treats headache with combination of Tylenol, Flexeril and ibuprofen which provides modest relief.     Reports remote history of occasional headache but nothing significant.  Current rescue therapy:  Tylenol 325mg , ibuprofen 400mg  and Flexeril 5mg  daily PRN Other current medications: Norco (chronic knee pain)  Past medications:  Gabapentin, Lyrica, tramadol (caused headaches)  EKG from November:  QTc 438.  PAST MEDICAL HISTORY: Past Medical History:  Diagnosis Date  . Elevated blood pressure (not  hypertension)    "elevates sometimes with MD visits"  . H/O measles   . Osteoarthritis (arthritis due to wear and tear of joints)    Bilateral knees    PAST SURGICAL HISTORY: Past Surgical History:  Procedure Laterality Date  . ABDOMINAL HYSTERECTOMY  1995   total for fibroids  . BREAST BIOPSY  1995   benign  . CATARACT EXTRACTION  2013 & 2015   Bilateral  . KNEE SURGERY     Left Arthroscopy  . 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/2016   Procedure: TOTAL KNEE ARTHROPLASTY;  Surgeon: Latanya Maudlin, MD;  Location: [facility];  Service: Orthopedics;  Laterality: Left;  . TOTAL KNEE ARTHROPLASTY Right 12/26/2020   Procedure: TOTAL KNEE ARTHROPLASTY;  Surgeon: Paralee Cancel, MD;  Location: [facility];  Service: Orthopedics;  Laterality: Right;  70 mins    MEDICATIONS: Current Outpatient Medications on File Prior to Visit  Medication Sig Dispense Refill  . conjugated estrogens (PREMARIN) vaginal cream Place 1 Applicatorful vaginally every Monday, Wednesday, and Friday.    . cyclobenzaprine (FLEXERIL) 5 MG tablet 1 tab po q day prn headache 6 tablet 0  . docusate sodium (COLACE) 100 MG capsule Take 1 capsule (100 mg total) by mouth 2 (two) times daily. 28 capsule 0  . ferrous sulfate (FERROUSUL) 325 (65 FE) MG tablet Take 1 tablet (325 mg total) by mouth 3 (three) times daily with meals for 14 days. 42 tablet 0  . fluticasone (FLONASE) 50 MCG/ACT nasal spray Place 2 sprays into both nostrils daily. (Patient taking differently:  Place 2 sprays into both nostrils daily as needed for allergies.) 16 g 1  . HYDROcodone-acetaminophen (NORCO) 5-325 MG tablet Take 1-2 tablets by mouth 3 (three) times daily as needed for moderate pain or severe pain. 75 tablet 0  . Multiple Vitamins-Minerals (MULTIVITAMIN ADULTS 50+) TABS Take 1 tablet by mouth daily.     . polyethylene glycol (MIRALAX / GLYCOLAX) 17 g packet Take 17 g by mouth 2 (two) times daily. 28 packet 0  . pregabalin (LYRICA) 25 MG capsule Take 25 mg  by mouth at bedtime.    . rosuvastatin (CRESTOR) 10 MG tablet Take 1 tablet (10 mg total) by mouth daily. Dc pravastatin if crestor covered/filled. 90 tablet 1  . Vitamin D, Ergocalciferol, (DRISDOL) 1.25 MG (50000 UNIT) CAPS capsule Take 1 capsule (50,000 Units total) by mouth every 7 (seven) days. 12 capsule 1   No current facility-administered medications on file prior to visit.    ALLERGIES: Allergies  Allergen Reactions  . Tramadol Nausea Only and Other (See Comments)    Dizziness    FAMILY HISTORY: Family History  Problem Relation Age of Onset  . Breast cancer Mother 25       Deceased  . Heart disease Mother   . Hypertension Mother   . Hypertension Father        Deceased  . Stroke Father   . Hypertension Sister   . Hypertension Brother   . Heart defect Daughter   . Hyperlipidemia Daughter   . Hypertension Daughter   . Vision loss Paternal Uncle   . Stroke Brother     Objective:  Blood pressure 140/81, pulse (!) 102, height 5\' 4"  (1.626 m), weight 147 lb 3.2 oz (66.8 kg), SpO2 96 %. General: No acute distress.  Patient appears well-groomed.   Head:  Normocephalic/atraumatic Eyes:  fundi examined but not visualized Neck: supple, no paraspinal tenderness, full range of motion Back: No paraspinal tenderness Heart: regular rate and rhythm Lungs: Clear to auscultation bilaterally. Vascular: No carotid bruits. Neurological Exam: Mental status: alert and oriented to person, place, and time, recent and remote memory intact, fund of knowledge intact, attention and concentration intact, speech fluent and not dysarthric, language intact. Cranial nerves: CN I: not tested CN II: pupils equal, round and reactive to light, visual fields intact CN III, IV, VI:  full range of motion, no nystagmus, no ptosis CN V: facial sensation intact. CN VII: upper and lower face symmetric CN VIII: hearing intact CN IX, X: gag intact, uvula midline CN XI: sternocleidomastoid and trapezius  muscles intact CN XII: tongue midline Bulk & Tone: normal, no fasciculations. Motor:  muscle strength 5/5 throughout Sensation:  Pinprick, temperature and vibratory sensation intact. Deep Tendon Reflexes:  2+ throughout,  toes downgoing.   Finger to nose testing:  Without dysmetria.   Heel to shin:  Without dysmetria.   Gait:  cautious gait.  Uses walker.  Romberg negative.    Thank you for allowing me to take part in the care of this patient.  Metta Clines, DO  CC:  Penni Homans, MD  Mackie Pai, PA-C

## 2021-03-06 ENCOUNTER — Other Ambulatory Visit (INDEPENDENT_AMBULATORY_CARE_PROVIDER_SITE_OTHER): Payer: BC Managed Care – PPO

## 2021-03-06 ENCOUNTER — Other Ambulatory Visit: Payer: Self-pay

## 2021-03-06 ENCOUNTER — Ambulatory Visit (INDEPENDENT_AMBULATORY_CARE_PROVIDER_SITE_OTHER): Payer: BC Managed Care – PPO | Admitting: Neurology

## 2021-03-06 ENCOUNTER — Encounter: Payer: Self-pay | Admitting: Neurology

## 2021-03-06 VITALS — BP 140/81 | HR 102 | Ht 64.0 in | Wt 147.2 lb

## 2021-03-06 DIAGNOSIS — R519 Headache, unspecified: Secondary | ICD-10-CM

## 2021-03-06 LAB — SEDIMENTATION RATE: Sed Rate: 41 mm/hr — ABNORMAL HIGH (ref 0–30)

## 2021-03-06 MED ORDER — GABAPENTIN 100 MG PO CAPS: 100.0000 mg | ORAL_CAPSULE | Freq: Every day | ORAL | 5 refills | Status: DC

## 2021-03-06 NOTE — Patient Instructions (Signed)
1.  Check CTA of head 2.  Check sed rate 3.  Start gabapentin 100mg  at bedtime.  If no improvement in headaches in 2 months, contact me 4.  At earliest onset of headache, try Ubrelvy 100mg .  May repeat in 2 hours if needed.  Maximum 2 tablets in 24 hours.  If effective, contact me for prescription

## 2021-03-09 DIAGNOSIS — M75102 Unspecified rotator cuff tear or rupture of left shoulder, not specified as traumatic: Secondary | ICD-10-CM | POA: Diagnosis not present

## 2021-03-09 DIAGNOSIS — M1612 Unilateral primary osteoarthritis, left hip: Secondary | ICD-10-CM | POA: Diagnosis not present

## 2021-03-09 DIAGNOSIS — M25661 Stiffness of right knee, not elsewhere classified: Secondary | ICD-10-CM | POA: Diagnosis not present

## 2021-03-09 DIAGNOSIS — Z7409 Other reduced mobility: Secondary | ICD-10-CM | POA: Diagnosis not present

## 2021-03-09 DIAGNOSIS — R531 Weakness: Secondary | ICD-10-CM | POA: Diagnosis not present

## 2021-04-18 ENCOUNTER — Ambulatory Visit: Payer: BC Managed Care – PPO | Admitting: Neurology

## 2021-04-26 ENCOUNTER — Telehealth: Payer: Self-pay | Admitting: Family Medicine

## 2021-04-26 MED ORDER — CYCLOBENZAPRINE HCL 5 MG PO TABS: ORAL_TABLET | ORAL | 0 refills | Status: DC

## 2021-04-26 NOTE — Telephone Encounter (Signed)
Rx sent 

## 2021-04-26 NOTE — Telephone Encounter (Signed)
Medication: cyclobenzaprine (FLEXERIL) 5 MG tablet   Has the patient contacted their pharmacy? Yes.   (If no, request that the patient contact the pharmacy for the refill.) (If yes, when and what did the pharmacy advise?)  Preferred Pharmacy (with phone number or street name): [facility] [id number] - [place], [place] - [address]  [address], [place] [address]  Phone:  [phone]  Fax:  [phone]   Agent: Please be advised that RX refills may take up to 3 business days. We ask that you follow-up with your pharmacy.

## 2021-05-07 ENCOUNTER — Ambulatory Visit: Payer: BC Managed Care – PPO | Admitting: Family Medicine

## 2021-05-08 ENCOUNTER — Ambulatory Visit: Payer: BC Managed Care – PPO | Admitting: Family Medicine

## 2021-06-05 ENCOUNTER — Telehealth: Payer: Self-pay | Admitting: Family Medicine

## 2021-06-05 NOTE — Telephone Encounter (Signed)
Pt. Called in stating that she is having very very bad knee pain that doesn't seem to be getting better with Tylenol. Pt called ortho surgeon and they informed her to contact us regarding medication refill for pain.  HYDROcodone-acetaminophen (NORCO) 5-325 MG tablet    [facility] [id number] - [place], [place] - [place], [place] [address]  Phone:  [phone]

## 2021-06-06 NOTE — Telephone Encounter (Signed)
Pt's daughter is aware 

## 2021-07-19 ENCOUNTER — Ambulatory Visit: Payer: Self-pay | Admitting: Family Medicine

## 2021-07-31 ENCOUNTER — Other Ambulatory Visit: Payer: Self-pay

## 2021-07-31 ENCOUNTER — Encounter: Payer: Self-pay | Admitting: Family Medicine

## 2021-07-31 ENCOUNTER — Ambulatory Visit (INDEPENDENT_AMBULATORY_CARE_PROVIDER_SITE_OTHER): Payer: Medicare Other | Admitting: Family Medicine

## 2021-07-31 VITALS — BP 126/72 | HR 93 | Temp 97.8°F | Resp 16 | Wt 151.8 lb

## 2021-07-31 DIAGNOSIS — E785 Hyperlipidemia, unspecified: Secondary | ICD-10-CM | POA: Diagnosis not present

## 2021-07-31 DIAGNOSIS — R739 Hyperglycemia, unspecified: Secondary | ICD-10-CM | POA: Diagnosis not present

## 2021-07-31 DIAGNOSIS — E2839 Other primary ovarian failure: Secondary | ICD-10-CM

## 2021-07-31 DIAGNOSIS — R32 Unspecified urinary incontinence: Secondary | ICD-10-CM

## 2021-07-31 DIAGNOSIS — Z79899 Other long term (current) drug therapy: Secondary | ICD-10-CM

## 2021-07-31 DIAGNOSIS — M2550 Pain in unspecified joint: Secondary | ICD-10-CM

## 2021-07-31 DIAGNOSIS — E569 Vitamin deficiency, unspecified: Secondary | ICD-10-CM

## 2021-07-31 DIAGNOSIS — Z23 Encounter for immunization: Secondary | ICD-10-CM | POA: Diagnosis not present

## 2021-07-31 DIAGNOSIS — Z1211 Encounter for screening for malignant neoplasm of colon: Secondary | ICD-10-CM

## 2021-07-31 DIAGNOSIS — E559 Vitamin D deficiency, unspecified: Secondary | ICD-10-CM

## 2021-07-31 DIAGNOSIS — Z780 Asymptomatic menopausal state: Secondary | ICD-10-CM

## 2021-07-31 LAB — CBC
HCT: 42 % (ref 36.0–46.0)
Hemoglobin: 13.9 g/dL (ref 12.0–15.0)
MCHC: 33 g/dL (ref 30.0–36.0)
MCV: 89 fl (ref 78.0–100.0)
Platelets: 364 10*3/uL (ref 150.0–400.0)
RBC: 4.72 Mil/uL (ref 3.87–5.11)
RDW: 12.7 % (ref 11.5–15.5)
WBC: 6.1 10*3/uL (ref 4.0–10.5)

## 2021-07-31 LAB — COMPREHENSIVE METABOLIC PANEL
ALT: 11 U/L (ref 0–35)
AST: 21 U/L (ref 0–37)
Albumin: 4.7 g/dL (ref 3.5–5.2)
Alkaline Phosphatase: 91 U/L (ref 39–117)
BUN: 11 mg/dL (ref 6–23)
CO2: 27 mEq/L (ref 19–32)
Calcium: 9.9 mg/dL (ref 8.4–10.5)
Chloride: 101 mEq/L (ref 96–112)
Creatinine, Ser: 0.66 mg/dL (ref 0.40–1.20)
GFR: 87.1 mL/min (ref >60.00)
Glucose, Bld: 85 mg/dL (ref 70–99)
Potassium: 4.5 mEq/L (ref 3.5–5.1)
Sodium: 138 mEq/L (ref 135–145)
Total Bilirubin: 0.5 mg/dL (ref 0.2–1.2)
Total Protein: 8.3 g/dL (ref 6.0–8.3)

## 2021-07-31 LAB — TSH: TSH: 2.6 u[IU]/mL (ref 0.35–5.50)

## 2021-07-31 LAB — LIPID PANEL
Cholesterol: 251 mg/dL — ABNORMAL HIGH (ref 0–200)
HDL: 56.4 mg/dL (ref >39.00)
LDL Cholesterol: 166 mg/dL — ABNORMAL HIGH (ref 0–99)
NonHDL: 194.99
Total CHOL/HDL Ratio: 4
Triglycerides: 147 mg/dL (ref 0.0–149.0)
VLDL: 29.4 mg/dL (ref 0.0–40.0)

## 2021-07-31 MED ORDER — CYCLOBENZAPRINE HCL 5 MG PO TABS: 2.5000 mg | ORAL_TABLET | Freq: Every evening | ORAL | 3 refills | Status: DC | PRN

## 2021-07-31 NOTE — Progress Notes (Signed)
Patient ID: Melanie Lee, female    DOB: [birth year]  Age: 75 y.o. MRN: [id number]    Subjective:   Chief Complaint  Patient presents with   Follow-up    Pain all the time    Subjective   HPI Melanie Lee presents for office visit today for follow up on osteoarthritis in knees and hyperlipidemia. She is doing well and has no recent febrile illnesses or ER visits to report. She has a PSHx of bilateral knee surgery and left hip surgery. She had her left knee surgery a long time ago, then about a year ago had left hip surgery then 4 months later had her right knee surgery. Surgeries were done by Dr. Alvan Dame. She reports feeling improvements from surgeries, but states that she expected more. Denies CP/palp/SOB/HA/congestion/fevers or GI c/o. Taking meds as prescribed.  She is experiencing bladder leakage which she will see a urologist at [place] for.  Expresses interest in getting a muscle relaxer for pain to use at bedtime.  Review of Systems  Constitutional:  Negative for chills, fatigue and fever.  HENT:  Negative for congestion, rhinorrhea, sinus pressure, sinus pain, sore throat and trouble swallowing.   Eyes:  Negative for pain.  Respiratory:  Negative for cough and shortness of breath.   Cardiovascular:  Negative for chest pain, palpitations and leg swelling.  Gastrointestinal:  Negative for abdominal pain, blood in stool, diarrhea, nausea and vomiting.  Genitourinary:  Negative for decreased urine volume, dysuria, flank pain, frequency, hematuria, vaginal bleeding, vaginal discharge and vaginal pain.       (+) bladder leakage  Musculoskeletal:  Positive for back pain.  Neurological:  Negative for headaches.   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   Her family history includes Breast cancer (age of onset: 70) in her mother; Heart defect in her daughter; Heart disease in her mother; Hyperlipidemia in her daughter; Hypertension in her brother, daughter, father, mother, and sister; Stroke in her brother and father; Vision loss in her paternal uncle.She reports that she has never smoked. She has never used smokeless tobacco. She reports current alcohol use. She reports that she does not use drugs.  Current Outpatient Medications on File Prior to Visit  Medication Sig Dispense Refill   conjugated estrogens (PREMARIN) vaginal cream Place 1 Applicatorful vaginally every Monday, Wednesday, and Friday.     docusate sodium (COLACE) 100 MG capsule Take 1 capsule (100 mg total) by mouth 2 (two) times daily. 28 capsule 0   fluticasone (FLONASE) 50 MCG/ACT nasal spray Place 2 sprays into both nostrils daily. (Patient taking differently: Place 2 sprays into both nostrils daily as needed for allergies.) 16 g 1   gabapentin (NEURONTIN) 100 MG capsule Take 1 capsule (100 mg total) by mouth at bedtime. 30 capsule 5   Multiple Vitamin (MULTIVITAMIN) capsule Take 1 capsule by mouth daily.     Multiple Vitamins-Minerals (MULTIVITAMIN ADULTS 50+) TABS Take 1 tablet by mouth daily.      polyethylene glycol (MIRALAX / GLYCOLAX) 17 g packet Take 17 g by mouth 2 (two) times daily.  28 packet 0   ferrous sulfate (FERROUSUL) 325 (65 FE) MG tablet Take 1 tablet (325 mg total) by mouth 3 (three) times daily with meals for 14 days. 42 tablet 0   Vitamin D, Ergocalciferol, (DRISDOL) 1.25 MG (50000 UNIT) CAPS capsule Take 1 capsule (50,000 Units total) by mouth every 7 (seven) days. (Patient not taking: Reported on 07/31/2021) 12 capsule 1   No current facility-administered  medications on file prior to visit.     Objective:  Objective  Physical Exam Constitutional:      General: She is not in acute distress.    Appearance: Normal appearance. She is not ill-appearing or toxic-appearing.  HENT:     Head: Normocephalic and atraumatic.     Right Ear: Tympanic membrane, ear canal and external ear normal.     Left Ear: Tympanic membrane, ear canal and external ear normal.     Nose: No congestion or rhinorrhea.  Eyes:     Extraocular Movements: Extraocular movements intact.     Pupils: Pupils are equal, round, and reactive to light.  Cardiovascular:     Rate and Rhythm: Normal rate and regular rhythm.     Pulses: Normal pulses.     Heart sounds: Normal heart sounds. No murmur heard. Pulmonary:     Effort: Pulmonary effort is normal. No respiratory distress.     Breath sounds: Normal breath sounds. No wheezing, rhonchi or rales.  Abdominal:     General: Bowel sounds are normal.     Palpations: Abdomen is soft. There is no mass.     Tenderness: There is no abdominal tenderness. There is no guarding.     Hernia: No hernia is present.  Musculoskeletal:        General: Normal range of motion.     Cervical back: Normal range of motion and neck supple.  Skin:    General: Skin is warm and dry.  Neurological:     Mental Status: She is alert and oriented to person, place, and time.  Psychiatric:        Behavior: Behavior normal.   BP 126/72   Pulse 93   Temp 97.8 F (36.6 C)   Resp 16   Wt 151 lb 12.8 oz (68.9 kg)   SpO2 98%   BMI 26.06 kg/m  Wt Readings from Last 3 Encounters:  07/31/21 151 lb 12.8 oz (68.9 kg)  03/06/21 147 lb 3.2 oz (66.8 kg)  02/06/21 145 lb (65.8 kg)     Lab Results  Component Value Date   WBC 6.1 07/31/2021   HGB 13.9 07/31/2021   HCT 42.0 07/31/2021   PLT 364.0 07/31/2021   GLUCOSE 85 07/31/2021   CHOL 251 (H) 07/31/2021   TRIG 147.0 07/31/2021   HDL 56.40 07/31/2021   LDLCALC 166 (H) 07/31/2021   ALT 11  07/31/2021   AST 21 07/31/2021   NA 138 07/31/2021   K 4.5 07/31/2021   CL 101 07/31/2021   CREATININE 0.66 07/31/2021   BUN 11 07/31/2021   CO2 27 07/31/2021   TSH 2.60 07/31/2021   INR 1.03 04/19/2016   HGBA1C 5.1 09/04/2020    CT Head Wo Contrast  Result Date: 02/10/2021 CLINICAL DATA:  75 year old female with chronic headache and dizziness, increasing over the last 3 weeks. EXAM: CT HEAD WITHOUT CONTRAST TECHNIQUE: Contiguous axial images were obtained from the base of the skull through the vertex without intravenous contrast. COMPARISON:  08/14/2020 CT and prior studies FINDINGS: Brain: No evidence of acute  infarction, hemorrhage, hydrocephalus, extra-axial collection or mass lesion/mass effect. Vascular: No hyperdense vessel or unexpected calcification. Skull: Normal. Negative for fracture or focal lesion. Sinuses/Orbits: No acute finding. Other: None. IMPRESSION: Unremarkable noncontrast head CT. Electronically Signed   By: Margarette Canada M.D.   On: 02/10/2021 11:44     Assessment & Plan:  Plan    Meds ordered this encounter  Medications   cyclobenzaprine (FLEXERIL) 5 MG tablet    Sig: Take 0.5-1 tablets (2.5-5 mg total) by mouth at bedtime as needed for muscle spasms. 1 tab po q day prn headache    Dispense:  30 tablet    Refill:  3     Problem List Items Addressed This Visit     Colon cancer screening   Relevant Orders   CBC (Completed)   Ambulatory referral to Gastroenterology   Urinary incontinence    She has an appt with [facility]rology at [facility] soon to discuss options.       Hyperglycemia    hgba1c acceptable, minimize simple carbs. Increase exercise as tolerated.       Relevant Orders   CBC (Completed)   Comprehensive metabolic panel (Completed)   TSH (Completed)   Arthralgia    Patient with chronic pain, back and joints. encouraged to stay as active as able. Given refill for Flexeril to use sparingly and Hydrocodone to use sparingly.       Hyperlipidemia LDL  goal <100    Tolerating statin, encouraged heart healthy diet, avoid trans fats, minimize simple carbs and saturated fats. Increase exercise as tolerated      Relevant Orders   CBC (Completed)   Lipid panel (Completed)   TSH (Completed)   Vitamin D deficiency    Supplement and monitor      Post-menopausal    Check Dexa scan      Relevant Orders   DG Bone Density   Other Visit Diagnoses     High risk medication use    -  Primary   Relevant Orders   Drug Monitoring Panel [phone] , Urine   Need for influenza vaccination       Relevant Orders   Flu Vaccine QUAD High Dose(Fluad)   Estrogen deficiency       Relevant Orders   DG Bone Density   Vitamin deficiency           Follow-up: Return in about 6 months (around 01/29/2022) for welcome to medicare visit.  I, Suezanne Jacquet, acting as a scribe for Penni Homans, MD, have documented all relevent documentation on behalf of Penni Homans, MD, as directed by Penni Homans, MD while in the presence of Penni Homans, MD. DO:08/01/21.  I, Mosie Lukes, MD personally performed the services described in this documentation. All medical record entries made by the scribe were at my direction and in my presence. I have reviewed the chart and agree that the record reflects my personal performance and is accurate and complete

## 2021-07-31 NOTE — Assessment & Plan Note (Signed)
Tolerating statin, encouraged heart healthy diet, avoid trans fats, minimize simple carbs and saturated fats. Increase exercise as tolerated 

## 2021-07-31 NOTE — Assessment & Plan Note (Signed)
hgba1c acceptable, minimize simple carbs. Increase exercise as tolerated.  

## 2021-07-31 NOTE — Assessment & Plan Note (Signed)
Supplement and monitor 

## 2021-07-31 NOTE — Patient Instructions (Addendum)
Bivalent covid shot with walk-ins available downstairs at [facility] Mon-Fri, 9am-3pm or any other pharmacy.  Look for multivitamins with minerals (like selenium and zinc)   Traditional medicare with a supplement versus HMO medicare?   Molnupiravir/Paxlovid is the new COVID medication we can give you if you get COVID so make sure you test if you have symptoms because we have to treat by day 5 of symptoms for it to be effective. If you are positive let us know so we can treat. If a home test is negative and your symptoms are persistent get a PCR test. Can check testing locations at [email or web address] If you are positive we will make an appointment with [place] and we will send in molnupiravir/paxlovid if you would like it. Check with your pharmacy before we meet to confirm they have it in stock, if they do not then we can get the prescription at the [facility].

## 2021-08-01 ENCOUNTER — Other Ambulatory Visit: Payer: Self-pay

## 2021-08-01 ENCOUNTER — Other Ambulatory Visit: Payer: Self-pay | Admitting: Family Medicine

## 2021-08-01 ENCOUNTER — Telehealth: Payer: Self-pay | Admitting: Family Medicine

## 2021-08-01 DIAGNOSIS — Z780 Asymptomatic menopausal state: Secondary | ICD-10-CM | POA: Insufficient documentation

## 2021-08-01 MED ORDER — HYDROCODONE-ACETAMINOPHEN 5-325 MG PO TABS: 1.0000 | ORAL_TABLET | Freq: Three times a day (TID) | ORAL | 0 refills | Status: DC | PRN

## 2021-08-01 MED ORDER — ROSUVASTATIN CALCIUM 10 MG PO TABS: 10.0000 mg | ORAL_TABLET | Freq: Every day | ORAL | 1 refills | Status: DC

## 2021-08-01 NOTE — Assessment & Plan Note (Signed)
Patient with chronic pain, back and joints. encouraged to stay as active as able. Given refill for Flexeril to use sparingly and Hydrocodone to use sparingly.

## 2021-08-01 NOTE — Telephone Encounter (Signed)
Pt daughter called regarding Pt(mom) visit yesterday 10.18.2022. Pt was advised by Charlett Blake that pain medication(hydrocodone) will be prescribed. However no prescription has been written. Please advise.

## 2021-08-01 NOTE — Assessment & Plan Note (Signed)
Check Dexa scan

## 2021-08-01 NOTE — Assessment & Plan Note (Signed)
She has an appt with urology at [facility] soon to discuss options.

## 2021-08-02 LAB — DRUG MONITORING PANEL 376104, URINE
Amphetamines: NEGATIVE ng/mL (ref <500)
Barbiturates: NEGATIVE ng/mL (ref <300)
Benzodiazepines: NEGATIVE ng/mL (ref <100)
Cocaine Metabolite: NEGATIVE ng/mL (ref <150)
Desmethyltramadol: NEGATIVE ng/mL (ref <100)
Opiates: NEGATIVE ng/mL (ref <100)
Oxycodone: NEGATIVE ng/mL (ref <100)
Tramadol: NEGATIVE ng/mL (ref <100)

## 2021-08-02 LAB — DM TEMPLATE

## 2021-08-13 ENCOUNTER — Other Ambulatory Visit: Payer: Self-pay

## 2021-08-13 ENCOUNTER — Ambulatory Visit (HOSPITAL_BASED_OUTPATIENT_CLINIC_OR_DEPARTMENT_OTHER)
Admission: RE | Admit: 2021-08-13 | Discharge: 2021-08-13 | Disposition: A | Discharge status: Home / Self Care | Payer: Medicare Other | Source: Ambulatory Visit | Attending: Family Medicine | Admitting: Family Medicine

## 2021-08-13 DIAGNOSIS — Z780 Asymptomatic menopausal state: Secondary | ICD-10-CM | POA: Insufficient documentation

## 2021-08-13 DIAGNOSIS — E2839 Other primary ovarian failure: Secondary | ICD-10-CM | POA: Insufficient documentation

## 2021-08-14 ENCOUNTER — Encounter: Payer: Self-pay | Admitting: *Deleted

## 2021-08-15 DIAGNOSIS — Z96651 Presence of right artificial knee joint: Secondary | ICD-10-CM | POA: Diagnosis not present

## 2021-08-21 ENCOUNTER — Ambulatory Visit (INDEPENDENT_AMBULATORY_CARE_PROVIDER_SITE_OTHER): Payer: Medicare Other | Admitting: Family

## 2021-08-21 ENCOUNTER — Encounter: Payer: Self-pay | Admitting: Family

## 2021-08-21 ENCOUNTER — Other Ambulatory Visit: Payer: Self-pay

## 2021-08-21 ENCOUNTER — Ambulatory Visit (HOSPITAL_BASED_OUTPATIENT_CLINIC_OR_DEPARTMENT_OTHER)
Admission: RE | Admit: 2021-08-21 | Discharge: 2021-08-21 | Disposition: A | Discharge status: Home / Self Care | Payer: Medicare Other | Source: Ambulatory Visit | Attending: Family | Admitting: Family

## 2021-08-21 VITALS — BP 122/70 | HR 82 | Temp 98.6°F | Ht 62.0 in | Wt 152.4 lb

## 2021-08-21 DIAGNOSIS — M545 Low back pain, unspecified: Secondary | ICD-10-CM | POA: Insufficient documentation

## 2021-08-21 DIAGNOSIS — Z471 Aftercare following joint replacement surgery: Secondary | ICD-10-CM | POA: Diagnosis not present

## 2021-08-21 DIAGNOSIS — R350 Frequency of micturition: Secondary | ICD-10-CM

## 2021-08-21 DIAGNOSIS — M1612 Unilateral primary osteoarthritis, left hip: Secondary | ICD-10-CM | POA: Diagnosis not present

## 2021-08-21 DIAGNOSIS — Z96642 Presence of left artificial hip joint: Secondary | ICD-10-CM | POA: Diagnosis not present

## 2021-08-21 MED ORDER — METHYLPREDNISOLONE ACETATE 40 MG/ML IJ SUSP
40.0000 mg | Freq: Once | INTRAMUSCULAR | Status: AC
  Administered 2021-08-21: 40 mg via INTRAMUSCULAR

## 2021-08-21 NOTE — Progress Notes (Signed)
Melanie Lee is a 75 y.o. female with the following history as recorded in Epic[facility]:  Patient Active Problem List   Diagnosis Date Noted   Post-menopausal 08/01/2021   Status post total right knee replacement 12/26/2020   Osteoarthritis of right knee 12/26/2020   Weight loss 03/24/2020   Vitamin D deficiency 03/24/2020   Hyperlipidemia LDL goal <100 06/03/2018   Daytime somnolence 06/03/2018   Urinary incontinence 11/09/2017   Hyperglycemia 11/09/2017   Arthralgia 11/09/2017   Strain of cervical portion of right trapezius muscle 02/07/2017   Status post total left knee replacement 04/23/2016   Visit for preventive health examination 10/28/2014   Primary osteoarthritis of both knees 10/28/2014   Colon cancer screening 10/28/2014   Osteoporosis screening 10/28/2014    Current Outpatient Medications  Medication Sig Dispense Refill   conjugated estrogens (PREMARIN) vaginal cream Place 1 Applicatorful vaginally every Monday, Wednesday, and Friday.     cyclobenzaprine (FLEXERIL) 5 MG tablet Take 0.5-1 tablets (2.5-5 mg total) by mouth at bedtime as needed for muscle spasms. 1 tab po q day prn headache 30 tablet 3   docusate sodium (COLACE) 100 MG capsule Take 1 capsule (100 mg total) by mouth 2 (two) times daily. 28 capsule 0   fluticasone (FLONASE) 50 MCG/ACT nasal spray Place 2 sprays into both nostrils daily. (Patient taking differently: Place 2 sprays into both nostrils daily as needed for allergies.) 16 g 1   HYDROcodone-acetaminophen (NORCO) 5-325 MG tablet Take 1 tablet by mouth 3 (three) times daily as needed for moderate pain or severe pain. 60 tablet 0   Multiple Vitamins-Minerals (MULTIVITAMIN ADULTS 50+) TABS Take 1 tablet by mouth daily.      rosuvastatin (CRESTOR) 10 MG tablet Take 1 tablet (10 mg total) by mouth daily. 90 tablet 1   gabapentin (NEURONTIN) 100 MG capsule Take 1 capsule (100 mg total) by mouth at bedtime. (Patient not taking: Reported on 08/21/2021) 30 capsule  5   No current facility-administered medications for this visit.    Allergies: Tramadol  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    Past Surgical History:  Procedure Laterality Date   ABDOMINAL HYSTERECTOMY  1995   total for fibroids   BREAST BIOPSY  1995   benign   CATARACT EXTRACTION  2013 & 2015   Bilateral   KNEE SURGERY     Left Arthroscopy   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/2016   Procedure: TOTAL KNEE ARTHROPLASTY;  Surgeon: Latanya Maudlin, MD;  Location: [facility];  Service: Orthopedics;  Laterality: Left;   TOTAL KNEE ARTHROPLASTY Right 12/26/2020   Procedure: TOTAL KNEE ARTHROPLASTY;  Surgeon: Paralee Cancel, MD;  Location: [facility];  Service: Orthopedics;  Laterality: Right;  70 mins    Family History  Problem Relation Age of Onset   Breast cancer Mother 24       Deceased   Heart disease Mother    Hypertension Mother    Hypertension Father        Deceased   Stroke Father    Hypertension Sister    Hypertension Brother    Heart defect Daughter    Hyperlipidemia Daughter    Hypertension Daughter    Vision loss Paternal Uncle    Stroke Brother     Social History   Tobacco Use  Smoking status: Never   Smokeless tobacco: Never  Substance Use Topics   Alcohol use: Yes    Alcohol/week: 0.0 standard drinks    Comment: rare    Subjective:   Accompanied by daughter;  Complaining of left sided back pain/ left hip pain x 2-3 days; complaining of flank pain; no history of kidney stones; no changes in bowel or bladder habits; pain most noticeable with movement;  Pain is localized over her low back and feels like it moves into her left hip; Has not taken anything for pain as she was unsure what to take;  Also requesting to get her U/A checked today- had an episode of incontinence recently;       Objective:  Vitals:   08/21/21 1325  BP: 122/70  Pulse: 82  Temp: 98.6 F (37 C)  TempSrc: Oral  SpO2: 99%  Weight: 152 lb 6.4 oz (69.1 kg)  Height: 5\' 2"  (1.575 m)    General: Well developed, well nourished, in no acute distress  Skin : Warm and dry.  Head: Normocephalic and atraumatic  Lungs: Respirations unlabored;  Musculoskeletal: No deformities; no active joint inflammation  Extremities: No edema, cyanosis, clubbing  Vessels: Symmetric bilaterally  Neurologic: Alert and oriented; speech intact; face symmetrical; uses cane for support  Assessment:  1. Urinary frequency   2. Left-sided low back pain without sciatica, unspecified chronicity     Plan:  Check U/A and urine culture; Update imaging; Depo-Medrol IM 40 given in office today; she has Norco at home and can use as needed; continue Flexeril for relief as well; follow up to be determined.  This visit occurred during the SARS-CoV-2 public health emergency.  Safety protocols were in place, including screening questions prior to the visit, additional usage of staff PPE, and extensive cleaning of exam room while observing appropriate contact time as indicated for disinfecting solutions.    No follow-ups on file.  Orders Placed This Encounter  Procedures   Urine Culture   DG Lumbar Spine Complete    Standing Status:   Future    Number of Occurrences:   1    Standing Expiration Date:   08/21/2022    Order Specific Question:   Reason for Exam (SYMPTOM  OR DIAGNOSIS REQUIRED)    Answer:   low back pain    Order Specific Question:   Preferred imaging location?    Answer:   [facility]   DG Hip Unilat W OR W/O Pelvis 2-3 Views Left    Standing Status:   Future    Number of Occurrences:   1    Standing Expiration Date:   08/21/2022    Order Specific Question:   Reason for Exam (SYMPTOM  OR DIAGNOSIS REQUIRED)    Answer:   left hip pain    Order Specific Question:   Preferred imaging location?     Answer:   [facility]   Urinalysis    Requested Prescriptions    No prescriptions requested or ordered in this encounter

## 2021-08-22 ENCOUNTER — Telehealth: Payer: Self-pay | Admitting: *Deleted

## 2021-08-22 ENCOUNTER — Ambulatory Visit: Payer: Medicare Other | Admitting: Medical

## 2021-08-22 ENCOUNTER — Telehealth: Payer: Self-pay | Admitting: Family Medicine

## 2021-08-22 ENCOUNTER — Other Ambulatory Visit: Payer: Self-pay | Admitting: Family Medicine

## 2021-08-22 NOTE — Telephone Encounter (Signed)
Medication: HYDROcodone-acetaminophen (NORCO) 5-325 MG tablet  Has the patient contacted their pharmacy? No.  Preferred Pharmacy: [facility] [id number] - [place], [place] [address]  Phone:  [phone]  Fax:  [phone]

## 2021-08-22 NOTE — Telephone Encounter (Signed)
Caller Name [place] Phone Number [phone] Patient Name Melanie Lee Patient DOB 08/21/1946 Call Type Message Only Information Provided Reason for Call Request for Lab/Test Results Initial Comment Caller states she is wanting to know the results of her x-ray she took yesterday. Additional Comment Office hours provided. Disp. Time Disposition Final User 08/22/2021 12:55:30 PM General Information Provided Yes Cathlean Sauer

## 2021-08-22 NOTE — Telephone Encounter (Signed)
FYI Urine was not taken to lab yesterday so urinalysis and culture was not done.  Left message on machine for patient to come back by if possible.  I noticed this when reviewing chart cause she had left a message that she wanted her xray results.

## 2021-08-22 NOTE — Telephone Encounter (Signed)
Requesting:norco 5-325 mg Contract:07/31/21 UDS:07/31/21 Last Visit:08/22/21 Next Visit:unknown Last Refill:08/01/21  Please Advise

## 2021-08-22 NOTE — Telephone Encounter (Signed)
Left message on machine that results are not back yet and we will call once we get them

## 2021-08-23 ENCOUNTER — Other Ambulatory Visit (INDEPENDENT_AMBULATORY_CARE_PROVIDER_SITE_OTHER): Payer: Medicare Other

## 2021-08-23 ENCOUNTER — Other Ambulatory Visit: Payer: Self-pay

## 2021-08-23 DIAGNOSIS — R350 Frequency of micturition: Secondary | ICD-10-CM | POA: Diagnosis not present

## 2021-08-23 LAB — URINALYSIS
Bilirubin Urine: NEGATIVE
Ketones, ur: NEGATIVE
Leukocytes,Ua: NEGATIVE
Nitrite: NEGATIVE
Specific Gravity, Urine: 1.02 (ref 1.000–1.030)
Total Protein, Urine: NEGATIVE
Urine Glucose: NEGATIVE
Urobilinogen, UA: 0.2 (ref 0.0–1.0)
pH: 6 (ref 5.0–8.0)

## 2021-08-23 NOTE — Addendum Note (Signed)
Addended by: Manuela Schwartz on: 08/23/2021 07:35 AM   Modules accepted: Orders

## 2021-08-23 NOTE — Telephone Encounter (Signed)
Lvm to call back

## 2021-08-23 NOTE — Telephone Encounter (Signed)
Patient was made aware that refill will be sent next week, she stated she understood.

## 2021-08-24 ENCOUNTER — Other Ambulatory Visit: Payer: Self-pay

## 2021-08-24 ENCOUNTER — Other Ambulatory Visit: Payer: Self-pay | Admitting: Family

## 2021-08-24 ENCOUNTER — Ambulatory Visit
Admission: RE | Admit: 2021-08-24 | Discharge: 2021-08-24 | Disposition: A | Discharge status: Home / Self Care | Payer: Medicare Other | Source: Ambulatory Visit | Attending: Family | Admitting: Family

## 2021-08-24 ENCOUNTER — Telehealth: Payer: Self-pay | Admitting: Family Medicine

## 2021-08-24 DIAGNOSIS — R109 Unspecified abdominal pain: Secondary | ICD-10-CM

## 2021-08-24 DIAGNOSIS — K5730 Diverticulosis of large intestine without perforation or abscess without bleeding: Secondary | ICD-10-CM | POA: Diagnosis not present

## 2021-08-24 DIAGNOSIS — K429 Umbilical hernia without obstruction or gangrene: Secondary | ICD-10-CM | POA: Diagnosis not present

## 2021-08-24 DIAGNOSIS — I878 Other specified disorders of veins: Secondary | ICD-10-CM | POA: Diagnosis not present

## 2021-08-24 DIAGNOSIS — I700 Atherosclerosis of aorta: Secondary | ICD-10-CM | POA: Diagnosis not present

## 2021-08-24 LAB — URINE CULTURE
MICRO NUMBER:: 12620771
SPECIMEN QUALITY:: ADEQUATE

## 2021-08-24 NOTE — Telephone Encounter (Signed)
Patient would like to know if she could go to the [facility] imaging center instead, to get the CT scan done. Please advice.

## 2021-08-28 NOTE — Telephone Encounter (Signed)
Scan was already done

## 2021-08-31 ENCOUNTER — Telehealth: Payer: Self-pay | Admitting: Family Medicine

## 2021-08-31 NOTE — Telephone Encounter (Signed)
Requesting: hydrocodone Contract:  UDS: 07/31/21 Last Visit: 07/31/21 Next Visit: none Last Refill: 08/01/21  Please Advise

## 2021-08-31 NOTE — Telephone Encounter (Signed)
Medication:  HYDROcodone-acetaminophen (NORCO) 5-325 MG tablet [[id number]]     Has the patient contacted their pharmacy? No. (If no, request that the patient contact the pharmacy for the refill.) (If yes, when and what did the pharmacy advise?)     Preferred Pharmacy (with phone number or street name):  [facility] [id number] - [place], [place] - [address]  [address], [place] [address]  Phone:  [phone]  Fax:  [phone]      Agent: Please be advised that RX refills may take up to 3 business days. We ask that you follow-up with your pharmacy.

## 2021-09-02 ENCOUNTER — Other Ambulatory Visit: Payer: Self-pay | Admitting: Family Medicine

## 2021-09-02 MED ORDER — HYDROCODONE-ACETAMINOPHEN 5-325 MG PO TABS: 1.0000 | ORAL_TABLET | Freq: Three times a day (TID) | ORAL | 0 refills | Status: DC | PRN

## 2021-09-17 NOTE — Progress Notes (Signed)
Virtual Visit via Video Note The purpose of this virtual visit is to provide medical care while limiting exposure to the novel coronavirus.    Consent was obtained for video visit:  Yes.   Answered questions that patient had about telehealth interaction:  Yes.   I discussed the limitations, risks, security and privacy concerns of performing an evaluation and management service by telemedicine. I also discussed with the patient that there may be a patient responsible charge related to this service. The patient expressed understanding and agreed to proceed.  Pt location: Home Physician Location: office Name of referring provider:  Mosie Lukes, MD I connected with Melanie Lee at patients initiation/request on 09/18/2021 at  1:30 PM EST by video enabled telemedicine application and verified that I am speaking with the correct person using two identifiers. Pt MRN:  [id number] Pt DOB:  08-04-1946 Video Participants:  Melanie Lee  Assessment and Plan:   New Onset Headache - given associated vertigo and positive response to [place], consider vestibular migraine  I would still like to check imaging of the vertebrobasilar system - CTA head Preventative medication deferred as they are infrequent Will provide her with more samples of Ubrelvy.  Otherwise, may continue Flexeril, ibuprofen, Tylenol. Follow up as needed.   History of Present Illness:  Melanie Lee is a 75 year old female who follows up for headaches.  UPDATE: Started gabapentin in May but discontinued because she really didn't want to take anything as they were infrequent anyway. Headaches have been infrequent now, about 2 times a month, lasting a day.  Marland Kitchen  Responds to ibuprofen, acetaminophen and Flexeril. MRA was ordered but not performed as she did not have insurance at the time.  Current rescue therapy:  Tylenol 325mg , ibuprofen 400mg  and Flexeril 5mg  daily PRN Other current medications: Norco (chronic knee  pain)    HISTORY: Off and on since 2020.  She reports a moderate frontal pressure that lasts all day and occurs once or twice a month.  CT of head from 08/14/2020 personally reviewed was unremarkable.  She then sometimes started waking up with a similar but severe headache and associated spinning sensation.  Vertigo resolves once she gets up but headache lasts all day.  These occur about twice a month.  No associated nausea, vomiting, photophobia, phonophobia, visual disturbance, numbness or weakness.  Repeat CT head on 02/10/2021 personally reviewed was also unremarkable.   She treats headache with combination of Tylenol, Flexeril and ibuprofen which provides modest relief.       Reports remote history of occasional headache but nothing significant.    Past medications:  Gabapentin, Lyrica, tramadol (caused headaches)  Past Medical History: 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    Medications: Outpatient Encounter Medications as of 09/18/2021  Medication Sig   conjugated estrogens (PREMARIN) vaginal cream Place 1 Applicatorful vaginally every Monday, Wednesday, and Friday.   cyclobenzaprine (FLEXERIL) 5 MG tablet Take 0.5-1 tablets (2.5-5 mg total) by mouth at bedtime as needed for muscle spasms. 1 tab po q day prn headache   docusate sodium (COLACE) 100 MG capsule Take 1 capsule (100 mg total) by mouth 2 (two) times daily.   fluticasone (FLONASE) 50 MCG/ACT nasal spray Place 2 sprays into both nostrils daily. (Patient taking differently: Place 2 sprays  into both nostrils daily as needed for allergies.)   gabapentin (NEURONTIN) 100 MG capsule Take 1 capsule (100 mg total) by mouth at bedtime. (Patient not taking: Reported on 08/21/2021)   HYDROcodone-acetaminophen (NORCO) 5-325 MG tablet Take 1 tablet by mouth 3  (three) times daily as needed for moderate pain or severe pain.   Multiple Vitamins-Minerals (MULTIVITAMIN ADULTS 50+) TABS Take 1 tablet by mouth daily.    rosuvastatin (CRESTOR) 10 MG tablet Take 1 tablet (10 mg total) by mouth daily.   No facility-administered encounter medications on file as of 09/18/2021.    Allergies: Allergies  Allergen Reactions   Tramadol Nausea Only and Other (See Comments)    Dizziness    Family History: Family History  Problem Relation Age of Onset   Breast cancer Mother 75       Deceased   Heart disease Mother    Hypertension Mother    Hypertension Father        Deceased   Stroke Father    Hypertension Sister    Hypertension Brother    Heart defect Daughter    Hyperlipidemia Daughter    Hypertension Daughter    Vision loss Paternal Uncle    Stroke Brother     Observations/Objective:   Height 5' (1.524 m), weight 147 lb (66.7 kg). No acute distress.  Alert and oriented.  Speech fluent and not dysarthric.  Language intact.     Follow Up Instructions:    -I discussed the assessment and treatment plan with the patient. The patient was provided an opportunity to ask questions and all were answered. The patient agreed with the plan and demonstrated an understanding of the instructions.   The patient was advised to call back or seek an in-person evaluation if the symptoms worsen or if the condition fails to improve as anticipated.  Dudley Major, DO

## 2021-09-18 ENCOUNTER — Other Ambulatory Visit: Payer: Self-pay

## 2021-09-18 ENCOUNTER — Encounter: Payer: Self-pay | Admitting: Neurology

## 2021-09-18 ENCOUNTER — Telehealth (INDEPENDENT_AMBULATORY_CARE_PROVIDER_SITE_OTHER): Payer: Medicare Other | Admitting: Neurology

## 2021-09-18 VITALS — Ht 60.0 in | Wt 147.0 lb

## 2021-09-18 DIAGNOSIS — G43809 Other migraine, not intractable, without status migrainosus: Secondary | ICD-10-CM

## 2021-09-18 NOTE — Addendum Note (Signed)
Addended by: Venetia Night on: 09/18/2021 02:41 PM   Modules accepted: Orders

## 2021-09-19 IMAGING — CT CT CHEST W/O CM
2 of 3 series · 15 of 36 positions shown, 18 images · non-contrast
Comparison: Chest radiograph 08/14/2020, CT abdomen and pelvis
11/27/2019

CLINICAL DATA: Normal chest radiograph, lung nodule

EXAM:
CT CHEST WITHOUT CONTRAST
TECHNIQUE: Multidetector CT imaging of the chest was performed following the
standard protocol without IV contrast. Sagittal and coronal MPR
images reconstructed from axial data set.

[Series 2: thorax · axial · 0.67mm/px · z∈[-244,-24]mm · 12 of 130 slices shown, 15 images]
[im 10/130  mediastinal]
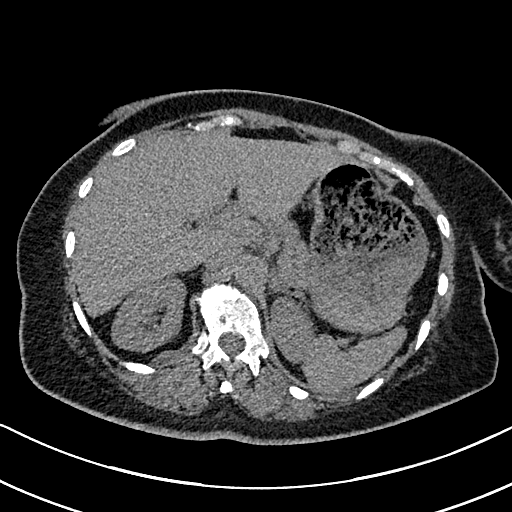
[im 10/130  lung]
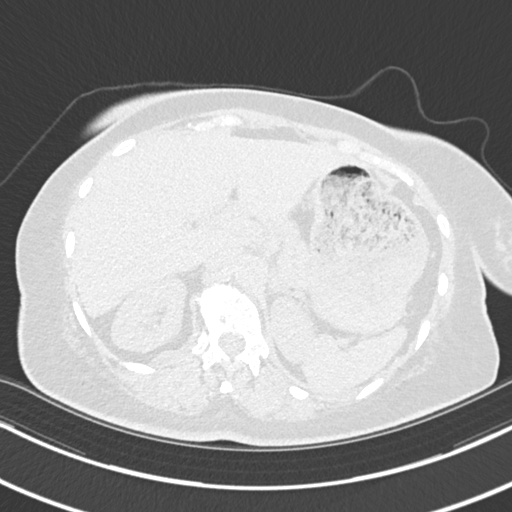
[im 20/130  lung]
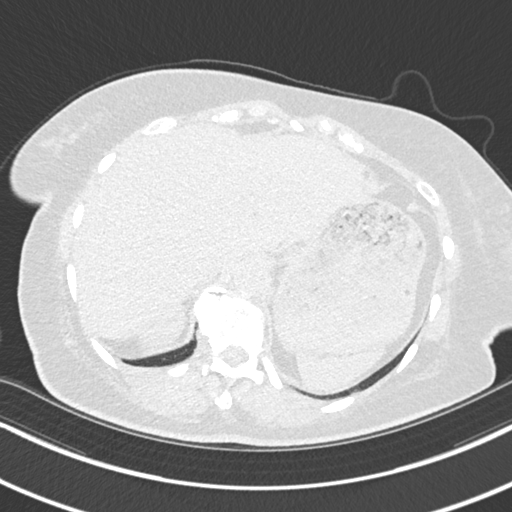
[im 29/130  lung]
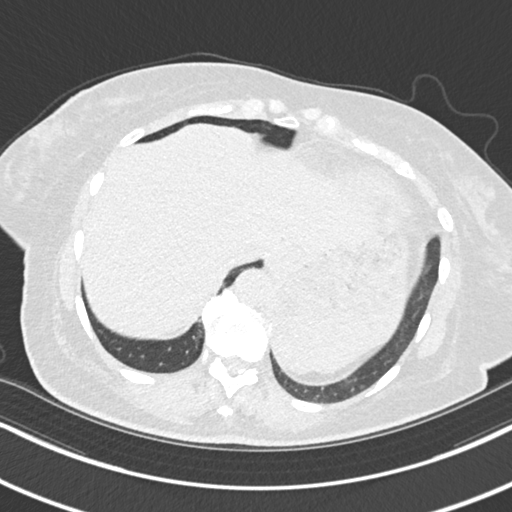
[im 39/130  lung]
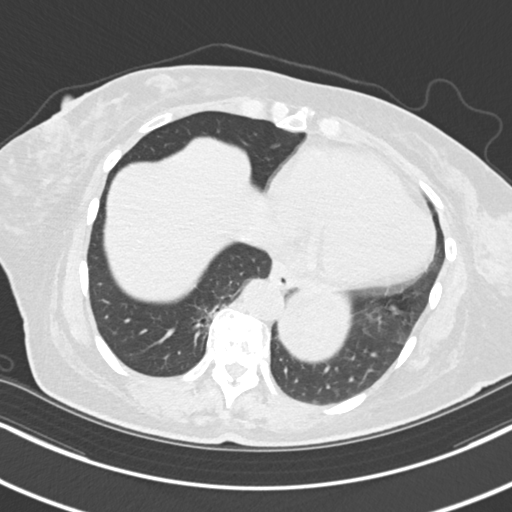
[im 48/130  mediastinal]
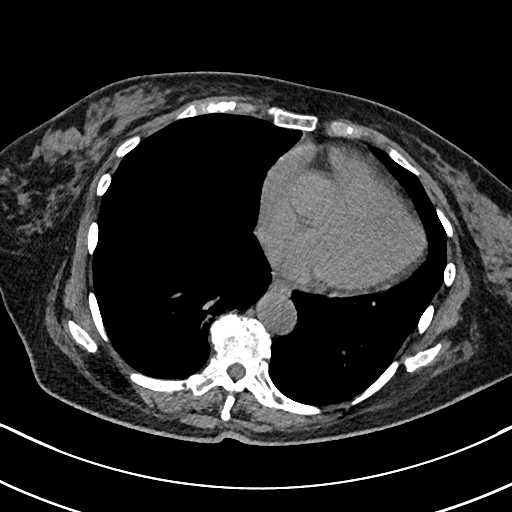
[im 48/130  lung]
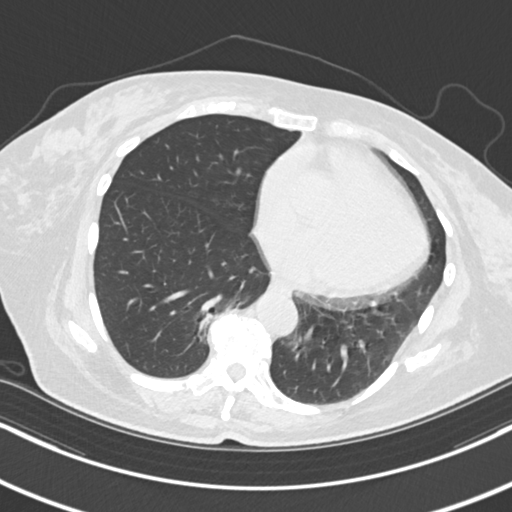
[im 58/130  lung]
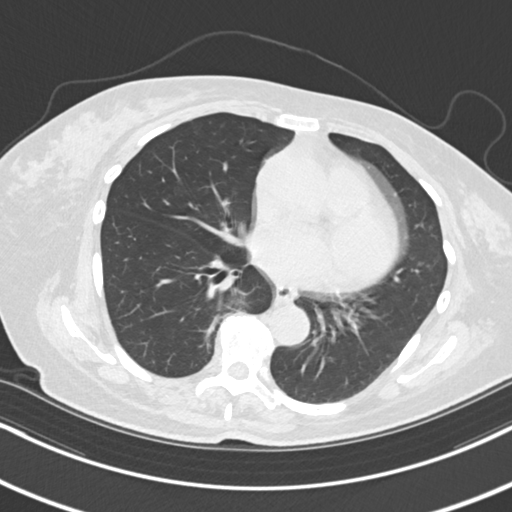
[im 72/130  lung]
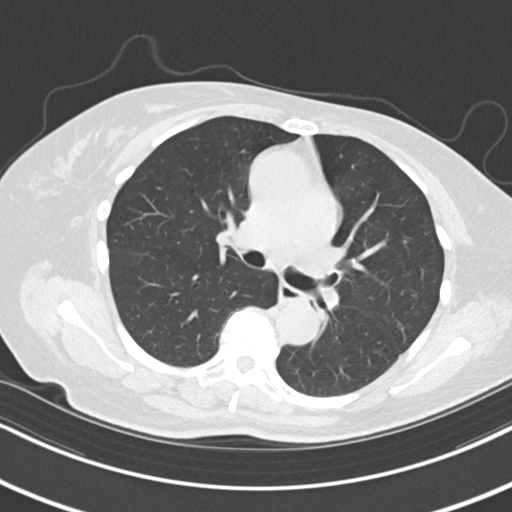
[im 82/130  lung]
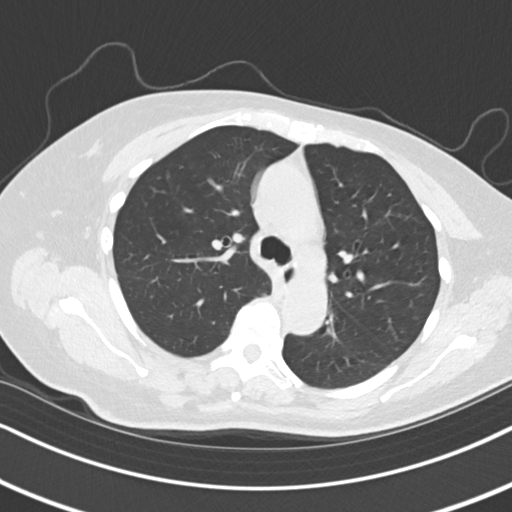
[im 91/130  mediastinal]
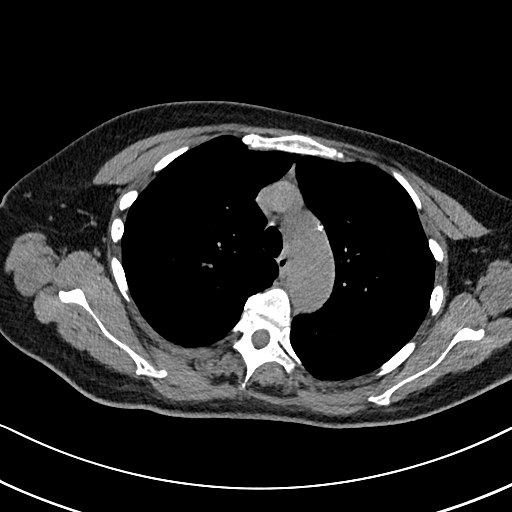
[im 91/130  lung]
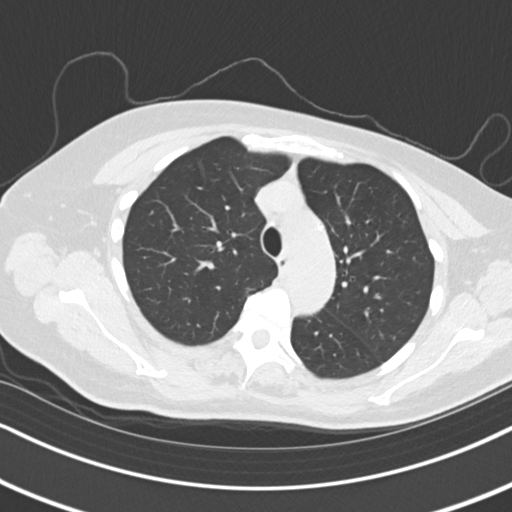
[im 101/130  lung]
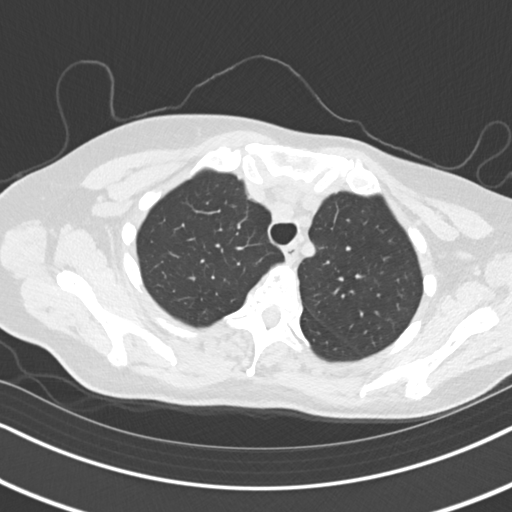
[im 110/130  lung]
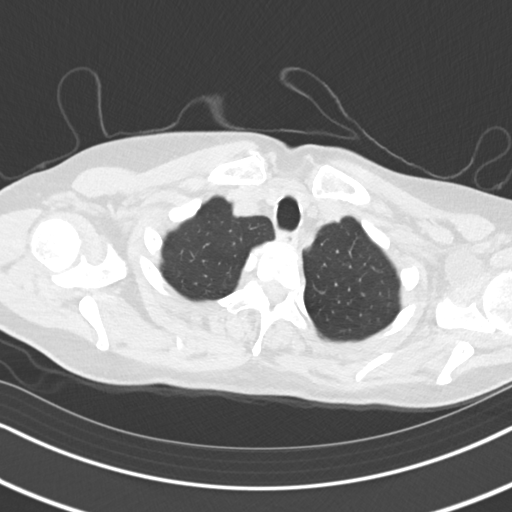
[im 120/130  lung]
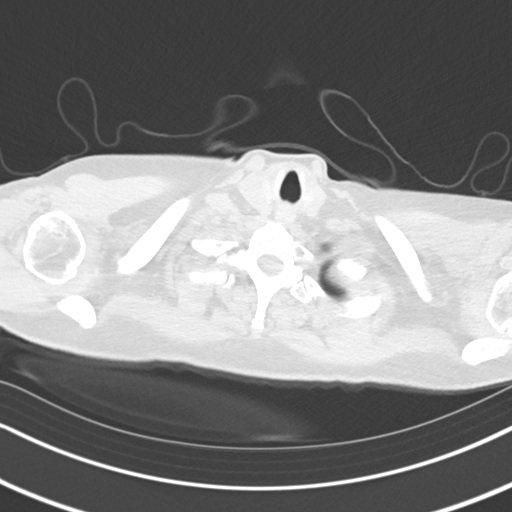

[Series 5: coronal · coronal · 0.56mm/px · 3 of 125 slices shown]
[im 25/125  lung]
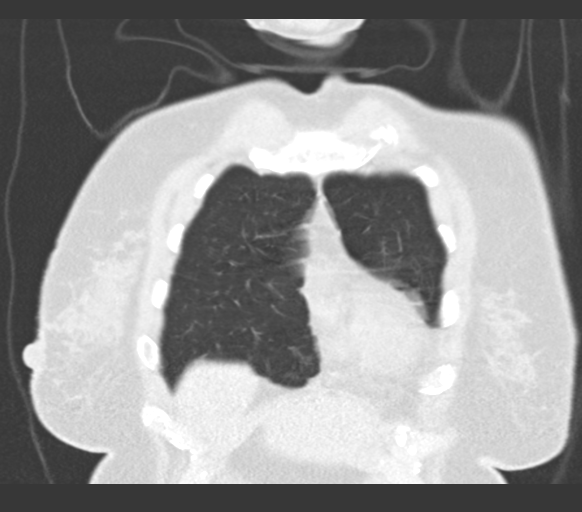
[im 50/125  lung]
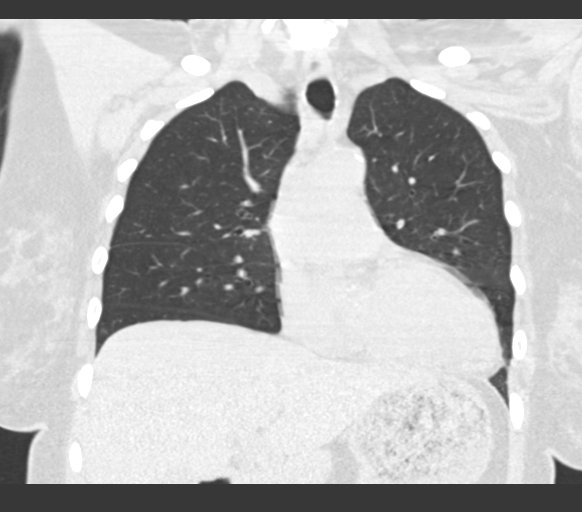
[im 75/125  lung]
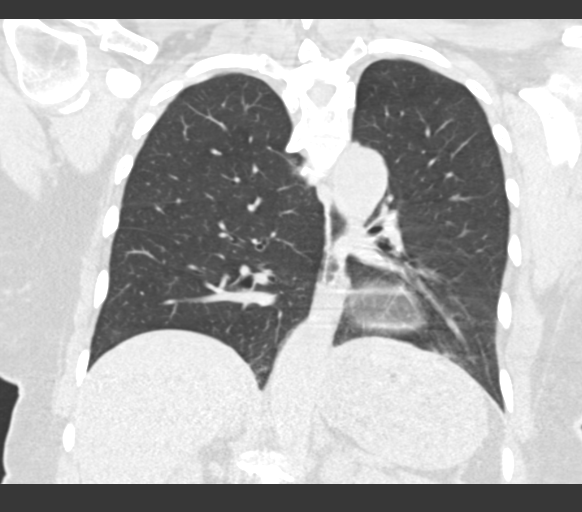

[15 of 36 positions shown; findings below may reference images not displayed]

FINDINGS: Cardiovascular: Atherosclerotic calcifications aorta. Ascending
thoracic aorta enlarged, 4.1 cm diameter image 61. Heart
unremarkable. No pericardial effusion.

Mediastinum/Nodes: Base of cervical region normal appearance. Few
scattered normal sized thoracic lymph nodes without adenopathy.
Esophagus normal appearance.

Lungs/Pleura: 4 mm noncalcified nodule laterally at base of RIGHT
lower lobe image 88, unchanged since 11/27/2019. Subsegmental
atelectasis at medial basilar segment of RIGHT lower lobe again
identified. Remaining lungs clear. No pulmonary infiltrate, pleural
effusion or pneumothorax. No additional pulmonary mass/nodule.

Upper Abdomen: Food debris distends stomach. Visualized upper
abdomen otherwise unremarkable

Musculoskeletal: Degenerative disc disease changes thoracic spine.
Question vertebral hemangioma at T3.
IMPRESSION: Stable 4 mm noncalcified RIGHT lower lobe pulmonary nodule.

Stable subsegmental atelectasis at medial basilar segment of RIGHT
lower lobe.

No acute intrathoracic abnormalities.

Aneurysmal dilatation of the ascending thoracic aorta 4.1 cm
diameter; Recommend annual imaging followup by CTA or MRA.

This recommendation follows 9171
ACCF/AHA/AATS/ACR/ASA/SCA/PESSOA/THEISS/OLI/RASAILI Guidelines for the
Diagnosis and Management of Patients with Thoracic Aortic Disease.
Circulation. 9171; 121: E266-e369.

Aortic Atherosclerosis (DDH3V-D60.0).

Aortic aneurysm NOS (DDH3V-HUA.Q).

## 2021-10-09 ENCOUNTER — Telehealth: Payer: Self-pay | Admitting: Family Medicine

## 2021-10-09 ENCOUNTER — Other Ambulatory Visit: Payer: Self-pay | Admitting: Family Medicine

## 2021-10-09 MED ORDER — HYDROCODONE-ACETAMINOPHEN 5-325 MG PO TABS: 1.0000 | ORAL_TABLET | Freq: Three times a day (TID) | ORAL | 0 refills | Status: DC | PRN

## 2021-10-09 NOTE — Telephone Encounter (Signed)
Requesting:HYDROcodone-acetaminophen  5-325 mg Contract:08/10/21 UDS:07/31/21 Last Visit:08/21/21 Next Visit:unknown Last Refill:09/02/21  Please Advise

## 2021-10-09 NOTE — Telephone Encounter (Signed)
Medication: HYDROcodone-acetaminophen (NORCO) 5-325 MG tablet  Has the patient contacted their pharmacy? No.   Preferred Pharmacy: [facility] [id number] - [place], [place] [address]  Phone:  [phone]  Fax:  [phone]

## 2021-10-12 ENCOUNTER — Other Ambulatory Visit: Payer: Self-pay

## 2021-10-12 ENCOUNTER — Telehealth: Payer: Self-pay | Admitting: Neurology

## 2021-10-12 ENCOUNTER — Ambulatory Visit
Admission: RE | Admit: 2021-10-12 | Discharge: 2021-10-12 | Disposition: A | Discharge status: Home / Self Care | Payer: Medicare Other | Source: Ambulatory Visit | Attending: Neurology | Admitting: Neurology

## 2021-10-12 DIAGNOSIS — G450 Vertebro-basilar artery syndrome: Secondary | ICD-10-CM | POA: Diagnosis not present

## 2021-10-12 DIAGNOSIS — G43109 Migraine with aura, not intractable, without status migrainosus: Secondary | ICD-10-CM | POA: Diagnosis not present

## 2021-10-12 DIAGNOSIS — G43809 Other migraine, not intractable, without status migrainosus: Secondary | ICD-10-CM

## 2021-10-12 MED ORDER — IOPAMIDOL (ISOVUE-370) INJECTION 76%
75.0000 mL | Freq: Once | INTRAVENOUS | Status: AC | PRN
  Administered 2021-10-12: 75 mL via INTRAVENOUS

## 2021-10-12 NOTE — Telephone Encounter (Signed)
Last visit note patient was provide with more samples of Ubrelvy.

## 2021-10-12 NOTE — Telephone Encounter (Signed)
Called patient and asked her how she is doing because I am needing more information to see if her headaches are becoming more frequent. Informed patient that samples of Ubrevly were just given and that it should last her awhile. Patient then said " I have to call you back!!! I am getting a head xray!!"

## 2021-10-12 NOTE — Telephone Encounter (Signed)
Pt called in stating she was told she could come pick up samples of a headache medication. She cannot remember the name of it.

## 2021-10-18 ENCOUNTER — Telehealth: Payer: Self-pay | Admitting: Family Medicine

## 2021-10-18 MED ORDER — CYCLOBENZAPRINE HCL 5 MG PO TABS: 2.5000 mg | ORAL_TABLET | Freq: Every evening | ORAL | 3 refills | Status: DC | PRN

## 2021-10-18 NOTE — Progress Notes (Signed)
How frequent or the headaches (on average, how many days a week/month are they occurring)?  Just started yesrerday. Pt states she woke up with the head.  How long do the headaches last?  All day into today.  Verify what preventative medication and dose you are taking (e.g. topiramate, propranolol, amitriptyline, Emgality, etc)  NONE Verify which rescue medication you are taking (triptan, Advil, Excedrin, Aleve, Ubrelvy, etc)  Roselyn Meier, Flexeril, Tyenol How often are you taking pain relievers/analgesics/rescue mediction?

## 2021-10-18 NOTE — Telephone Encounter (Signed)
Rx refilled and patient notified.

## 2021-10-18 NOTE — Telephone Encounter (Signed)
Medication: cyclobenzaprine (FLEXERIL) 5 MG tablet  Has the patient contacted their pharmacy? Yes.   (If no, request that the patient contact the pharmacy for the refill.) (If yes, when and what did the pharmacy advise?)  Preferred Pharmacy (with phone number or street name):   [facility] [id number] - [place], [place] - [address]  [address], [place]  [address]  Phone:  [phone]  Fax:  [phone]  Agent: Please be advised that RX refills may take up to 3 business days. We ask that you follow-up with your pharmacy.

## 2021-10-26 LAB — HM DEXA SCAN: HM Dexa Scan: NORMAL

## 2021-10-30 ENCOUNTER — Other Ambulatory Visit: Payer: Self-pay | Admitting: Family Medicine

## 2021-10-30 MED ORDER — HYDROCODONE-ACETAMINOPHEN 5-325 MG PO TABS: 1.0000 | ORAL_TABLET | Freq: Three times a day (TID) | ORAL | 0 refills | Status: DC | PRN

## 2021-10-30 NOTE — Telephone Encounter (Signed)
Medication: HYDROcodone-acetaminophen (NORCO) 5-325 MG tablet  Has the patient contacted their pharmacy? Yes.   (If no, request that the patient contact the pharmacy for the refill.) (If yes, when and what did the pharmacy advise?)  Preferred Pharmacy (with phone number or street name):  [facility] [id number] - [place], [place] - [address]  [address], [place] [address]  Phone:  [phone]  Fax:  [phone]   Agent: Please be advised that RX refills may take up to 3 business days. We ask that you follow-up with your pharmacy.

## 2021-10-30 NOTE — Telephone Encounter (Signed)
Requesting: Norco 5-325mg  Contract: 08/10/2021 UDS: 07/31/2021 Last Visit:   08/21/2021 Next Visit:  None.  Last Refill: 10/09/2021 #60 x 0RF  Please Advise

## 2021-10-31 DIAGNOSIS — M25551 Pain in right hip: Secondary | ICD-10-CM | POA: Diagnosis not present

## 2021-10-31 DIAGNOSIS — M7061 Trochanteric bursitis, right hip: Secondary | ICD-10-CM | POA: Diagnosis not present

## 2021-10-31 DIAGNOSIS — M25561 Pain in right knee: Secondary | ICD-10-CM | POA: Diagnosis not present

## 2021-10-31 DIAGNOSIS — Z96651 Presence of right artificial knee joint: Secondary | ICD-10-CM | POA: Diagnosis not present

## 2021-11-05 DIAGNOSIS — N3281 Overactive bladder: Secondary | ICD-10-CM | POA: Diagnosis not present

## 2021-11-26 ENCOUNTER — Other Ambulatory Visit: Payer: Self-pay | Admitting: Family Medicine

## 2021-11-26 MED ORDER — HYDROCODONE-ACETAMINOPHEN 5-325 MG PO TABS: 1.0000 | ORAL_TABLET | Freq: Three times a day (TID) | ORAL | 0 refills | Status: DC | PRN

## 2021-11-26 NOTE — Telephone Encounter (Addendum)
Requesting:norco 5-325 mg Contract:07/31/21 UDS:07/31/21 Last Visit:08/21/21 Next Visit:unknown Last Refill:10/30/21  Please Advise

## 2021-11-26 NOTE — Telephone Encounter (Signed)
Medication: HYDROcodone-acetaminophen (NORCO) 5-325 MG tablet  Has the patient contacted their pharmacy? No.   Preferred Pharmacy (with phone number or street name):  [facility] [id number] - [place], [place] - [address]  [address], [place] [address]  Phone:  [phone]  Fax:  [phone]   Agent: Please be advised that RX refills may take up to 3 business days. We ask that you follow-up with your pharmacy.

## 2021-12-07 DIAGNOSIS — Z1231 Encounter for screening mammogram for malignant neoplasm of breast: Secondary | ICD-10-CM | POA: Diagnosis not present

## 2022-01-10 ENCOUNTER — Telehealth: Payer: Self-pay | Admitting: Family Medicine

## 2022-01-10 ENCOUNTER — Ambulatory Visit (INDEPENDENT_AMBULATORY_CARE_PROVIDER_SITE_OTHER): Payer: Medicare Other | Admitting: Medical

## 2022-01-10 VITALS — BP 140/80 | HR 88 | Ht 60.0 in | Wt 151.0 lb

## 2022-01-10 DIAGNOSIS — G8929 Other chronic pain: Secondary | ICD-10-CM

## 2022-01-10 DIAGNOSIS — M25551 Pain in right hip: Secondary | ICD-10-CM | POA: Diagnosis not present

## 2022-01-10 DIAGNOSIS — M25561 Pain in right knee: Secondary | ICD-10-CM

## 2022-01-10 DIAGNOSIS — E785 Hyperlipidemia, unspecified: Secondary | ICD-10-CM | POA: Diagnosis not present

## 2022-01-10 DIAGNOSIS — G44209 Tension-type headache, unspecified, not intractable: Secondary | ICD-10-CM

## 2022-01-10 MED ORDER — ROSUVASTATIN CALCIUM 10 MG PO TABS: 10.0000 mg | ORAL_TABLET | Freq: Every day | ORAL | 1 refills | Status: DC

## 2022-01-10 MED ORDER — HYDROCODONE-ACETAMINOPHEN 5-325 MG PO TABS: 1.0000 | ORAL_TABLET | Freq: Three times a day (TID) | ORAL | 0 refills | Status: DC | PRN

## 2022-01-10 NOTE — Telephone Encounter (Signed)
Called pharmacy and gave them the dx code ?

## 2022-01-10 NOTE — Progress Notes (Signed)
? ?Subjective:  ? ? Patient ID: Melanie Lee, female    DOB: September 14, 1946, 76 y.o.   MRN: [id number] ? ?HPI ? ?Pt has chronic rt hip pain. Pt has been on narcotic for this.  ? 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 ? ?Pt rt knee has been hurting more. Pt has seen orthopedist and patient and wife states bony growth that is new and causing pain. She will need surgery for this. ? ?Pt in past has used flexeril for ha. She will use with low dose ibuprofen in combination. Lamonte Sakai have been much less.  ? ?Hyperlipidemia-Crestor.  Patient states needs refill. ? ?Review of Systems  ?Constitutional:  Negative for chills, fatigue and fever.  ?HENT:  Negative for congestion.   ?Respiratory:  Negative for chest tightness, shortness of breath and wheezing.   ?Cardiovascular:  Negative for chest pain and palpitations.  ?Gastrointestinal:  Negative for abdominal pain, constipation and rectal pain.  ?Genitourinary:  Negative for dysuria.  ?Musculoskeletal:  Negative for back pain and joint swelling.  ?     Rt knee pain and rt hip pain.  ?Skin:  Negative for rash.  ?Neurological:  Negative for facial asymmetry and headaches.  ?Hematological:  Negative for adenopathy. Does not bruise/bleed easily.  ?Psychiatric/Behavioral:  Negative for behavioral problems, confusion, dysphoric mood, self-injury and suicidal ideas. The patient is not nervous/anxious.   ? ? ?Past Medical History:  ?Diagnosis Date  ? Elevated blood pressure (not hypertension)   ? "elevates sometimes with MD visits"  ? H/O measles   ? Osteoarthritis (arthritis due to wear and tear of joints)   ? Bilateral knees  ? ?  ?Social History  ? ?Socioeconomic History  ? Marital status: Single  ?  Spouse name: Not on file  ? Number of children: Not on file  ? Years of education: Not on file  ? Highest education level: Not on file  ?Occupational History  ? Not on file  ?Tobacco Use  ? Smoking status: Never  ? Smokeless tobacco: Never  ?Vaping Use  ? Vaping Use:  Never used  ?Substance and Sexual Activity  ? Alcohol use: Yes  ?  Alcohol/week: 0.0 standard drinks  ?  Comment: rare  ? Drug use: No  ? Sexual activity: Not Currently  ?Other Topics Concern  ? Not on file  ?Social History Narrative  ? 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  ?   ? Right handed  ? ?Social Determinants of Health  ? ?Financial Resource Strain: Not on file  ?Food Insecurity: Not on file  ?Transportation Needs: Not on file  ?Physical Activity: Not on file  ?Stress: Not on file  ?Social Connections: Not on file  ?Intimate Partner Violence: Not on file  ? ? ?Past Surgical History:  ?Procedure Laterality Date  ? ABDOMINAL HYSTERECTOMY  1995  ? total for fibroids  ? BREAST BIOPSY  1995  ? benign  ? CATARACT EXTRACTION  2013 & 2015  ? Bilateral  ? KNEE SURGERY    ? Left Arthroscopy  ? 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 Procedure: TOTAL KNEE ARTHROPLASTY;  Surgeon: Latanya Maudlin, MD;  Location: [facility];  Service: Orthopedics;  Laterality: Left;  ? TOTAL KNEE ARTHROPLASTY Right 12/26/2020  ? Procedure: TOTAL KNEE ARTHROPLASTY;  Surgeon: Paralee Cancel, MD;  Location: [facility];  Service: Orthopedics;  Laterality: Right;  70 mins  ? ? ?Family History  ?Problem Relation Age of Onset  ? Breast cancer Mother 91  ?  Deceased  ? Heart disease Mother   ? Hypertension Mother   ? Hypertension Father   ?     Deceased  ? Stroke Father   ? Hypertension Sister   ? Hypertension Brother   ? Heart defect Daughter   ? Hyperlipidemia Daughter   ? Hypertension Daughter   ? Vision loss Paternal Uncle   ? Stroke Brother   ? ? ?Allergies  ?Allergen Reactions  ? Tramadol Nausea Only and Other (See Comments)  ?  Dizziness  ? ? ?Current Outpatient Medications on File Prior to Visit  ?Medication Sig Dispense Refill  ? conjugated estrogens (PREMARIN) vaginal cream Place 1 Applicatorful vaginally every Monday, Wednesday, and Friday.    ? cyclobenzaprine (FLEXERIL) 5 MG tablet Take 0.5-1 tablets (2.5-5 mg total) by mouth at bedtime as needed  for muscle spasms. 1 tab po q day prn headache 30 tablet 3  ? fluticasone (FLONASE) 50 MCG/ACT nasal spray Place 2 sprays into both nostrils daily. (Patient taking differently: Place 2 sprays into both nostrils daily as needed for allergies.) 16 g 1  ? Multiple Vitamins-Minerals (MULTIVITAMIN ADULTS 50+) TABS Take 1 tablet by mouth daily.     ? ?No current facility-administered medications on file prior to visit.  ? ? ?BP 140/80   Pulse 88   Ht 5' (1.524 m)   Wt 151 lb (68.5 kg)   SpO2 98%   BMI 29.49 kg/m?  ?  ?   ?Objective:  ? Physical Exam ? ?General ?Mental Status- Alert. General Appearance- Not in acute distress.  ? ?Skin ?General: Color- Normal Color. Moisture- Normal Moisture. ? ?Neck ?Carotid Arteries- Normal color. Moisture- Normal Moisture. No carotid bruits. No JVD. ? ?Chest and Lung Exam ?Auscultation: ?Breath Sounds:-Normal. ? ?Cardiovascular ?Auscultation:Rythm- Regular. ?Murmurs & Other Heart Sounds:Auscultation of the heart reveals- No Murmurs. ? ?Abdomen ?Inspection:-Inspeection Normal. ?Palpation/Percussion:Note:No mass. Palpation and Percussion of the abdomen reveal- Non Tender, Non Distended + BS, no rebound or guarding. ? ?Neurologic ?Cranial Nerve exam:- CN III-XII intact(No nystagmus), symmetric smile. ?Strength:- 5/5 equal and symmetric strength both upper and lower extremities.  ? ?Right knee-pain present range of motion. ?   ?Assessment & Plan:  ? ?Patient Instructions  ?Chronic right hip and knee pain.  With potential upcoming right knee surgery within the next 1 to 2 months.  Refilled your Norco prescription today.  Up-to-date on contract and UDS.  We discussed how you will likely be on stronger medication for brief time after surgery.  Remember not to use Norco and other narcotic at the same time.  Sounds like you are orthopedic doctor has a good plan on how to taper you off stronger medication and transition you back to [place]. ? ?Occasional tension headaches that respond to  low-dose Flexeril and low-dose ibuprofen.  On review you do have refills Flexeril. ? ?Hyperlipidemia-refilled your Crestor. ? ?Follow-up with your PCP as regular scheduled or with myself as needed.  ? ? ?Mackie Pai, PA-C  ?

## 2022-01-10 NOTE — Patient Instructions (Signed)
Chronic right hip and knee pain.  With potential upcoming right knee surgery within the next 1 to 2 months.  Refilled your Norco prescription today.  Up-to-date on contract and UDS.  We discussed how you will likely be on stronger medication for brief time after surgery.  Remember not to use Norco and other narcotic at the same time.  Sounds like you are orthopedic doctor has a good plan on how to taper you off stronger medication and transition you back to [place]. ? ?Occasional tension headaches that respond to low-dose Flexeril and low-dose ibuprofen.  On review you do have refills Flexeril. ? ?Hyperlipidemia-refilled your Crestor. ? ?Follow-up with your PCP as regular scheduled or with myself as needed. ?

## 2022-01-10 NOTE — Telephone Encounter (Signed)
Pharmacy is needing diagnosis code for hydrocodone. Please advise.  ?

## 2022-01-10 NOTE — Telephone Encounter (Signed)
Patient is calling to know if a diagnosis code can be sent to pharmacy. Please advise.  ?

## 2022-01-11 ENCOUNTER — Ambulatory Visit: Payer: Medicare Other | Admitting: Medical

## 2022-01-14 ENCOUNTER — Other Ambulatory Visit: Payer: Self-pay

## 2022-01-14 ENCOUNTER — Telehealth: Payer: Self-pay

## 2022-01-14 MED ORDER — HYDROCODONE-ACETAMINOPHEN 5-325 MG PO TABS: 1.0000 | ORAL_TABLET | Freq: Three times a day (TID) | ORAL | 0 refills | Status: DC | PRN

## 2022-01-29 ENCOUNTER — Ambulatory Visit (INDEPENDENT_AMBULATORY_CARE_PROVIDER_SITE_OTHER): Payer: Medicare Other

## 2022-01-29 ENCOUNTER — Ambulatory Visit: Payer: Medicare Other

## 2022-01-29 DIAGNOSIS — Z0000 Encounter for general adult medical examination without abnormal findings: Secondary | ICD-10-CM

## 2022-01-29 NOTE — Progress Notes (Signed)
? ?Subjective:  ? Melanie Lee is a 76 y.o. female who presents for an Initial Medicare Annual Wellness Visit. ? ?I connected with  [name] on 01/29/22 by a audio enabled telemedicine application and verified that I am speaking with the correct person using two identifiers. ? ?Patient Location: Home ? ?Provider Location: Office/Clinic ? ?I discussed the limitations of evaluation and management by telemedicine. The patient expressed understanding and agreed to proceed.  ? ?Review of Systems    ? ?Cardiac Risk Factors include: advanced age (>80mn, >>39women);dyslipidemia ? ?   ?Objective:  ?  ?Today's Vitals  ? 01/29/22 1308  ?PainSc: 5   ? ?There is no height or weight on file to calculate BMI. ? ? ?  09/18/2021  ? 12:53 PM 03/06/2021  ?  3:00 PM 12/26/2020  ? 10:10 AM 12/26/2020  ?  5:50 AM 12/13/2020  ? 11:18 AM 08/14/2020  ?  2:47 PM 06/03/2016  ?  2:04 PM  ?Advanced Directives  ?Does Patient Have a Medical Advance Directive? No No No No No No No  ?Would patient like information on creating a medical advance directive?  Yes (MAU/Ambulatory/Procedural Areas - Information given) No - Patient declined No - Patient declined  No - Patient declined No - patient declined information  ? ? ?Current Medications (verified) ?Outpatient Encounter Medications as of 01/29/2022  ?Medication Sig  ? conjugated estrogens (PREMARIN) vaginal cream Place 1 Applicatorful vaginally every Monday, Wednesday, and Friday.  ? cyclobenzaprine (FLEXERIL) 5 MG tablet Take 0.5-1 tablets (2.5-5 mg total) by mouth at bedtime as needed for muscle spasms. 1 tab po q day prn headache  ? fluticasone (FLONASE) 50 MCG/ACT nasal spray Place 2 sprays into both nostrils daily. (Patient taking differently: Place 2 sprays into both nostrils daily as needed for allergies.)  ? HYDROcodone-acetaminophen (NORCO) 5-325 MG tablet Take 1 tablet by mouth 3 (three) times daily as needed for moderate pain or severe pain.  ? Multiple Vitamins-Minerals (MULTIVITAMIN  ADULTS 50+) TABS Take 1 tablet by mouth daily.   ? rosuvastatin (CRESTOR) 10 MG tablet Take 1 tablet (10 mg total) by mouth daily.  ? ?No facility-administered encounter medications on file as of 01/29/2022.  ? ? ?Allergies (verified) ?Tramadol  ? ?History: ?Past Medical History:  ?Diagnosis Date  ? Elevated blood pressure (not hypertension)   ? "elevates sometimes with MD visits"  ? H/O measles   ? Osteoarthritis (arthritis due to wear and tear of joints)   ? Bilateral knees  ? ?Past Surgical History:  ?Procedure Laterality Date  ? ABDOMINAL HYSTERECTOMY  1995  ? total for fibroids  ? BREAST BIOPSY  1995  ? benign  ? CATARACT EXTRACTION  2013 & 2015  ? Bilateral  ? KNEE SURGERY    ? Left Arthroscopy  ? 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 Procedure: TOTAL KNEE ARTHROPLASTY;  Surgeon: RLatanya Maudlin MD;  Location: [facility];  Service: Orthopedics;  Laterality: Left;  ? TOTAL KNEE ARTHROPLASTY Right 12/26/2020  ? Procedure: TOTAL KNEE ARTHROPLASTY;  Surgeon: OParalee Cancel MD;  Location: [facility];  Service: Orthopedics;  Laterality: Right;  70 mins  ? ?Family History  ?Problem Relation Age of Onset  ? Breast cancer Mother 79 ?     Deceased  ? Heart disease Mother   ? Hypertension Mother   ? Hypertension Father   ?     Deceased  ? Stroke Father   ? Hypertension Sister   ? Hypertension Brother   ?  Heart defect Daughter   ? Hyperlipidemia Daughter   ? Hypertension Daughter   ? Vision loss Paternal Uncle   ? Stroke Brother   ? ?Social History  ? ?Socioeconomic History  ? Marital status: Single  ?  Spouse name: Not on file  ? Number of children: Not on file  ? Years of education: Not on file  ? Highest education level: Not on file  ?Occupational History  ? Not on file  ?Tobacco Use  ? Smoking status: Never  ? Smokeless tobacco: Never  ?Vaping Use  ? Vaping Use: Never used  ?Substance and Sexual Activity  ? Alcohol use: Yes  ?  Alcohol/week: 0.0 standard drinks  ?  Comment: rare  ? Drug use: No  ? Sexual activity: Not  Currently  ?Other Topics Concern  ? Not on file  ?Social History Narrative  ? 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  ?   ? Right handed  ? ?Social Determinants of Health  ? ?Financial Resource Strain: Medium Risk  ? Difficulty of Paying Living Expenses: Somewhat hard  ?Food Insecurity: No Food Insecurity  ? Worried About Charity fundraiser in the Last Year: Never true  ? Ran Out of Food in the Last Year: Never true  ?Transportation Needs: No Transportation Needs  ? Lack of Transportation (Medical): No  ? Lack of Transportation (Non-Medical): No  ?Physical Activity: Inactive  ? Days of Exercise per Week: 0 days  ? Minutes of Exercise per Session: 0 min  ?Stress: Stress Concern Present  ? Feeling of Stress : To some extent  ?Social Connections: Moderately Isolated  ? Frequency of Communication with Friends and Family: More than three times a week  ? Frequency of Social Gatherings with Friends and Family: Three times a week  ? Attends Religious Services: More than 4 times per year  ? Active Member of Clubs or Organizations: No  ? Attends Archivist Meetings: Never  ? Marital Status: Never married  ? ? ?Tobacco Counseling ?Counseling given: Not Answered ? ? ?Clinical Intake: ? ?Pre-visit preparation completed: Yes ? ?Pain : 0-10 ?Pain Score: 5  ?Pain Location: Knee ?Pain Orientation: Right ?Pain Descriptors / Indicators: Aching ?Pain Onset: More than a month ago ?Pain Frequency: Occasional ? ?  ? ?Nutritional Risks: None ?Diabetes: No ? ?How often do you need to have someone help you when you read instructions, pamphlets, or other written materials from your doctor or pharmacy?: 1 - Never ? ?Diabetic?No ? ?Interpreter Needed?: No ? ?Information entered by :: Caressa Scearce ? ? ?Activities of Daily Living ? ?  01/29/2022  ?  1:10 PM  ?In your present state of health, do you have any difficulty performing the following activities:  ?Hearing? 0  ?Vision? 0  ?Difficulty concentrating or making decisions? 0  ?Walking or  climbing stairs? 0  ?Dressing or bathing? 0  ?Doing errands, shopping? 1  ?Comment limited on standing  ?Preparing Food and eating ? N  ?Using the Toilet? N  ?In the past six months, have you accidently leaked urine? Y  ?Do you have problems with loss of bowel control? N  ?Managing your Medications? N  ?Managing your Finances? N  ?Housekeeping or managing your Housekeeping? N  ? ? ?Patient Care Team: ?Mosie Lukes, MD as PCP - General (Family Medicine) ?Hale Bogus., MD as Referring Physician (Gastroenterology) ? ?Indicate any recent Medical Services you may have received from other than [facility] providers in the past year (date may be  approximate). ? ?   ?Assessment:  ? This is a routine wellness examination for Kamea. ? ?Hearing/Vision screen ?No results found. ? ?Dietary issues and exercise activities discussed: ?Current Exercise Habits: The patient does not participate in regular exercise at present, Exercise limited by: None identified ? ? Goals Addressed   ?None ?  ? ?Depression Screen ? ?  01/29/2022  ?  1:07 PM 08/21/2021  ?  1:30 PM 10/20/2020  ? 10:42 AM 02/07/2017  ?  8:21 AM 01/01/2016  ?  9:02 AM 10/28/2014  ?  2:21 PM  ?PHQ 2/9 Scores  ?PHQ - 2 Score 2 0 0 0 0 0  ?PHQ- 9 Score 2  2     ?  ?Fall Risk ? ?  01/29/2022  ?  1:06 PM 09/18/2021  ? 12:53 PM 08/21/2021  ?  1:29 PM 03/06/2021  ?  3:24 PM 03/06/2021  ?  3:00 PM  ?Fall Risk   ?Falls in the past year? 0 0 1 0 0  ?Number falls in past yr: 0 0 0 0 0  ?Injury with Fall? 0 0 0 0 0  ?Risk for fall due to : No Fall Risks  No Fall Risks    ?Follow up   Falls evaluation completed    ? ? ?FALL RISK PREVENTION PERTAINING TO THE HOME: ? ?Any stairs in or around the home? Yes  ?If so, are there any without handrails? No  ?Home free of loose throw rugs in walkways, pet beds, electrical cords, etc? No  ?Adequate lighting in your home to reduce risk of falls? Yes ? ?ASSISTIVE DEVICES UTILIZED TO PREVENT FALLS: ? ?Life alert? No  ?Use of a cane, walker or w/c? Yes ,  sometimes ?Grab bars in the bathroom? Yes  ?Shower chair or bench in shower? Yes  ?Elevated toilet seat or a handicapped toilet? Yes  ? ?TIMED UP AND GO: ? ?Was the test performed? No .  ? ? ?Cognitive Function: ?

## 2022-01-29 NOTE — Patient Instructions (Signed)
Melanie Lee , ?Thank you for taking time to come for your Medicare Wellness Visit. I appreciate your ongoing commitment to your health goals. Please review the following plan we discussed and let me know if I can assist you in the future.  ? ?Screening recommendations/referrals: ?Colonoscopy: 11/27/15 due 11/26/25 ?Mammogram: 12/07/21 ?Bone Density: 08/13/21 due 08/14/23 ?Recommended yearly ophthalmology/optometry visit for glaucoma screening and checkup ?Recommended yearly dental visit for hygiene and checkup ? ?Vaccinations: ?Influenza vaccine: up to date ?Pneumococcal vaccine: up to date ?Tdap vaccine: up to date ?Shingles vaccine: Due-May obtain vaccine at our office or your local pharmacy.    ?Covid-19:Due-May obtain vaccine at our office or your local pharmacy.  ? ?Advanced directives: no, packet mailed ? ?Conditions/risks identified: see problem list ? ?Next appointment: Follow up in one year for your annual wellness visit  ? ? ?Preventive Care 88 Years and Older, Female ?Preventive care refers to lifestyle choices and visits with your health care provider that can promote health and wellness. ?What does preventive care include? ?A yearly physical exam. This is also called an annual well check. ?Dental exams once or twice a year. ?Routine eye exams. Ask your health care provider how often you should have your eyes checked. ?Personal lifestyle choices, including: ?Daily care of your teeth and gums. ?Regular physical activity. ?Eating a healthy diet. ?Avoiding tobacco and drug use. ?Limiting alcohol use. ?Practicing safe sex. ?Taking low-dose aspirin every day. ?Taking vitamin and mineral supplements as recommended by your health care provider. ?What happens during an annual well check? ?The services and screenings done by your health care provider during your annual well check will depend on your age, overall health, lifestyle risk factors, and family history of disease. ?Counseling  ?Your health care provider  may ask you questions about your: ?Alcohol use. ?Tobacco use. ?Drug use. ?Emotional well-being. ?Home and relationship well-being. ?Sexual activity. ?Eating habits. ?History of falls. ?Memory and ability to understand (cognition). ?Work and work Statistician. ?Reproductive health. ?Screening  ?You may have the following tests or measurements: ?Height, weight, and BMI. ?Blood pressure. ?Lipid and cholesterol levels. These may be checked every 5 years, or more frequently if you are over 25 years old. ?Skin check. ?Lung cancer screening. You may have this screening every year starting at age 45 if you have a 30-pack-year history of smoking and currently smoke or have quit within the past 15 years. ?Fecal occult blood test (FOBT) of the stool. You may have this test every year starting at age 81. ?Flexible sigmoidoscopy or colonoscopy. You may have a sigmoidoscopy every 5 years or a colonoscopy every 10 years starting at age 2. ?Hepatitis C blood test. ?Hepatitis B blood test. ?Sexually transmitted disease (STD) testing. ?Diabetes screening. This is done by checking your blood sugar (glucose) after you have not eaten for a while (fasting). You may have this done every 1-3 years. ?Bone density scan. This is done to screen for osteoporosis. You may have this done starting at age 18. ?Mammogram. This may be done every 1-2 years. Talk to your health care provider about how often you should have regular mammograms. ?Talk with your health care provider about your test results, treatment options, and if necessary, the need for more tests. ?Vaccines  ?Your health care provider may recommend certain vaccines, such as: ?Influenza vaccine. This is recommended every year. ?Tetanus, diphtheria, and acellular pertussis (Tdap, Td) vaccine. You may need a Td booster every 10 years. ?Zoster vaccine. You may need this after age 25. ?Pneumococcal  13-valent conjugate (PCV13) vaccine. One dose is recommended after age 11. ?Pneumococcal  polysaccharide (PPSV23) vaccine. One dose is recommended after age 90. ?Talk to your health care provider about which screenings and vaccines you need and how often you need them. ?This information is not intended to replace advice given to you by your health care provider. Make sure you discuss any questions you have with your health care provider. ?Document Released: 10/27/2015 Document Revised: 06/19/2016 Document Reviewed: 08/01/2015 ?Elsevier Interactive Patient Education ? 2017 [employer]. ? ?Fall Prevention in the Home ?Falls can cause injuries. They can happen to people of all ages. There are many things you can do to make your home safe and to help prevent falls. ?What can I do on the outside of my home? ?Regularly fix the edges of walkways and driveways and fix any cracks. ?Remove anything that might make you trip as you walk through a door, such as a raised step or threshold. ?Trim any bushes or trees on the path to your home. ?Use bright outdoor lighting. ?Clear any walking paths of anything that might make someone trip, such as rocks or tools. ?Regularly check to see if handrails are loose or broken. Make sure that both sides of any steps have handrails. ?Any raised decks and porches should have guardrails on the edges. ?Have any leaves, snow, or ice cleared regularly. ?Use sand or salt on walking paths during winter. ?Clean up any spills in your garage right away. This includes oil or grease spills. ?What can I do in the bathroom? ?Use night lights. ?Install grab bars by the toilet and in the tub and shower. Do not use towel bars as grab bars. ?Use non-skid mats or decals in the tub or shower. ?If you need to sit down in the shower, use a plastic, non-slip stool. ?Keep the floor dry. Clean up any water that spills on the floor as soon as it happens. ?Remove soap buildup in the tub or shower regularly. ?Attach bath mats securely with double-sided non-slip rug tape. ?Do not have throw rugs and other  things on the floor that can make you trip. ?What can I do in the bedroom? ?Use night lights. ?Make sure that you have a light by your bed that is easy to reach. ?Do not use any sheets or blankets that are too big for your bed. They should not hang down onto the floor. ?Have a firm chair that has side arms. You can use this for support while you get dressed. ?Do not have throw rugs and other things on the floor that can make you trip. ?What can I do in the kitchen? ?Clean up any spills right away. ?Avoid walking on wet floors. ?Keep items that you use a lot in easy-to-reach places. ?If you need to reach something above you, use a strong step stool that has a grab bar. ?Keep electrical cords out of the way. ?Do not use floor polish or wax that makes floors slippery. If you must use wax, use non-skid floor wax. ?Do not have throw rugs and other things on the floor that can make you trip. ?What can I do with my stairs? ?Do not leave any items on the stairs. ?Make sure that there are handrails on both sides of the stairs and use them. Fix handrails that are broken or loose. Make sure that handrails are as long as the stairways. ?Check any carpeting to make sure that it is firmly attached to the stairs. Fix any carpet  that is loose or worn. ?Avoid having throw rugs at the top or bottom of the stairs. If you do have throw rugs, attach them to the floor with carpet tape. ?Make sure that you have a light switch at the top of the stairs and the bottom of the stairs. If you do not have them, ask someone to add them for you. ?What else can I do to help prevent falls? ?Wear shoes that: ?Do not have high heels. ?Have rubber bottoms. ?Are comfortable and fit you well. ?Are closed at the toe. Do not wear sandals. ?If you use a stepladder: ?Make sure that it is fully opened. Do not climb a closed stepladder. ?Make sure that both sides of the stepladder are locked into place. ?Ask someone to hold it for you, if possible. ?Clearly  mark and make sure that you can see: ?Any grab bars or handrails. ?First and last steps. ?Where the edge of each step is. ?Use tools that help you move around (mobility aids) if they are needed. These include

## 2022-01-30 ENCOUNTER — Other Ambulatory Visit: Payer: Self-pay | Admitting: Family Medicine

## 2022-01-30 DIAGNOSIS — Z96651 Presence of right artificial knee joint: Secondary | ICD-10-CM | POA: Diagnosis not present

## 2022-01-30 DIAGNOSIS — M25561 Pain in right knee: Secondary | ICD-10-CM | POA: Diagnosis not present

## 2022-01-30 DIAGNOSIS — M7061 Trochanteric bursitis, right hip: Secondary | ICD-10-CM | POA: Diagnosis not present

## 2022-01-30 NOTE — Telephone Encounter (Signed)
? ?  HYDROcodone-acetaminophen (NORCO) 5-325 MG tablet [[id number]]  ? ? ?[facility] [id number] - [address], [place] [address]  ?

## 2022-01-31 ENCOUNTER — Ambulatory Visit: Payer: Medicare Other

## 2022-01-31 MED ORDER — HYDROCODONE-ACETAMINOPHEN 5-325 MG PO TABS: 1.0000 | ORAL_TABLET | Freq: Three times a day (TID) | ORAL | 0 refills | Status: DC | PRN

## 2022-01-31 NOTE — Telephone Encounter (Signed)
Requesting: Norco 5-325 ?Contract: 01/10/22 ?UDS: 07/31/21 ?Last Visit: 01/10/22 ?Next Visit: not scheduled ?Last Refill: 01/14/22 ? ?Please Advise  ?

## 2022-02-09 DIAGNOSIS — M79651 Pain in right thigh: Secondary | ICD-10-CM | POA: Diagnosis not present

## 2022-02-15 DIAGNOSIS — M79604 Pain in right leg: Secondary | ICD-10-CM | POA: Diagnosis not present

## 2022-02-15 DIAGNOSIS — Z96651 Presence of right artificial knee joint: Secondary | ICD-10-CM | POA: Diagnosis not present

## 2022-02-20 ENCOUNTER — Telehealth: Payer: Self-pay | Admitting: Family Medicine

## 2022-02-20 NOTE — Telephone Encounter (Signed)
Pt's daughter stated it needs to be sent to this pharmacy as the other one is on backorder. She also wanted to mention her knee surgery is July 11th.   ? ?Medication: HYDROcodone-acetaminophen (NORCO) 5-325 MG tablet  ? ?Has the patient contacted their pharmacy? Yes.   ? ? ?Preferred Pharmacy:  ? ?[id number] - [address], [place] [address]  ?Phone:  [phone]  Fax:  [phone]  ?

## 2022-02-21 ENCOUNTER — Other Ambulatory Visit: Payer: Self-pay

## 2022-02-21 ENCOUNTER — Other Ambulatory Visit: Payer: Self-pay | Admitting: Family Medicine

## 2022-02-21 MED ORDER — HYDROCODONE-ACETAMINOPHEN 5-325 MG PO TABS: 1.0000 | ORAL_TABLET | Freq: Three times a day (TID) | ORAL | 0 refills | Status: DC | PRN

## 2022-02-21 NOTE — Telephone Encounter (Signed)
Pt's daughter states pharm did not receive rx yet. Pt is needing medications sent to [facility] ?

## 2022-02-21 NOTE — Telephone Encounter (Signed)
Pt's daughter called back and states pharmacy has still not received rx.  ?

## 2022-02-21 NOTE — Telephone Encounter (Signed)
Spoke to different pharmacist. They report the pt did pick up the Rx on 4/20.  There was another Rx on file from the month before at another [facility] location but they couldn't get it filled due to a shortage. The pharmacist canceled the Rx and I have pended a new one for Dr. Charlett Blake. ?

## 2022-02-21 NOTE — Telephone Encounter (Signed)
Request sent to Dr. Charlett Blake. ?

## 2022-02-21 NOTE — Telephone Encounter (Signed)
Rx sent on 4/20 to [facility]. Called pharmacy and they stated they had it in stock and she has not picked it up. ?

## 2022-02-21 NOTE — Progress Notes (Unsigned)
Requesting: Norco 5-325 ?Contract: 01/10/22 ?UDS: 07/31/21 ?Last Visit: 01/10/22 ?Next Visit:  not scheduled ?Last Refill: 01/31/22 ? ?Please send to [facility] ?

## 2022-03-12 ENCOUNTER — Other Ambulatory Visit: Payer: Self-pay | Admitting: Family Medicine

## 2022-03-12 DIAGNOSIS — M170 Bilateral primary osteoarthritis of knee: Secondary | ICD-10-CM

## 2022-03-12 NOTE — Telephone Encounter (Signed)
Requesting: Norco 5-325 MG Contract: 01/10/22 UDS: 07/31/21 Last Visit: 01/10/22 Next Visit: not scheduled Last Refill: 02/21/22  Please Advise

## 2022-03-12 NOTE — Telephone Encounter (Signed)
Medication:  HYDROcodone-acetaminophen (NORCO) 5-325 MG tablet   Has the patient contacted their pharmacy? No.  Preferred Pharmacy: [id number] - [place]   [address], [place] [address]  Phone:  [phone]  Fax:  [phone]

## 2022-03-13 MED ORDER — HYDROCODONE-ACETAMINOPHEN 5-325 MG PO TABS: 1.0000 | ORAL_TABLET | Freq: Three times a day (TID) | ORAL | 0 refills | Status: DC | PRN

## 2022-03-27 ENCOUNTER — Other Ambulatory Visit: Payer: Self-pay | Admitting: Family Medicine

## 2022-03-27 ENCOUNTER — Telehealth: Payer: Self-pay | Admitting: Family Medicine

## 2022-03-27 DIAGNOSIS — M170 Bilateral primary osteoarthritis of knee: Secondary | ICD-10-CM

## 2022-03-27 NOTE — Telephone Encounter (Signed)
Medication:   HYDROcodone-acetaminophen (NORCO) 5-325 MG tablet [[id number]]   Has the patient contacted their pharmacy? No. (If no, request that the patient contact the pharmacy for the refill.) (If yes, when and what did the pharmacy advise?)  Preferred Pharmacy (with phone number or street name):   [id number] - [address], [place] [address]  Phone:  [phone]  Fax:  [phone]   Agent: Please be advised that RX refills may take up to 3 business days. We ask that you follow-up with your pharmacy.

## 2022-03-27 NOTE — Telephone Encounter (Signed)
Levada Dy (daughter) called to let Dr. Charlett Blake know that pt will be having her knee surgery on 7.11.23

## 2022-03-28 NOTE — Telephone Encounter (Signed)
Requesting: norco 5-325 mg Contract: 01/10/22 UDS: 07/31/21 Last Visit: 01/10/22 Next Visit: not scheduled Last Refill: 03/13/22  Please Advise

## 2022-03-29 MED ORDER — HYDROCODONE-ACETAMINOPHEN 5-325 MG PO TABS: 1.0000 | ORAL_TABLET | Freq: Three times a day (TID) | ORAL | 0 refills | Status: DC | PRN

## 2022-04-08 NOTE — Progress Notes (Addendum)
Anesthesia Review:  [id number]  Cardiologist : none  Chest x-ray : EKG :04/10/22  Echo : Stress test: Cardiac Cath :  Activity level: can do a flight of stairs without difficutly  Sleep Study/ CPAP : none  Fasting Blood Sugar :      / Checks Blood Sugar -- times a day:   Blood Thinner/ Instructions /Last Dose: ASA / Instructions/ Last Dose :   Blood pressure .  At preop in left arm was 145/102 and in right arm was 141/85.  PT denies any chest pain, dizziness , blurred visition or shortness of breath.  PT stated she did wake up this am with hjeadache which she does sometime.s  PT also reports cyclobenzaprine which she takes periodically also gives her a headache.  PT given copy of blood pressure readings and instructed to contact PCP.  PT voiced understanding.  EKG done at preop.  Armando Reichert aware.  No new orders given.

## 2022-04-08 NOTE — Progress Notes (Signed)
DUE TO COVID-19 ONLY  2 VISITOR IS ALLOWED TO COME WITH YOU AND STAY IN THE WAITING ROOM ONLY DURING PRE OP AND PROCEDURE DAY OF SURGERY. 4  VISITOR  MAY VISIT WITH YOU AFTER SURGERY IN YOUR PRIVATE ROOM DURING VISITING HOURS ONLY! YOU MAY HAVE ONE PERSON SPEND THE NITE WITH YOU IN YOUR ROOM AFTER SURGERY.      Your procedure is scheduled on:    04/23/2022   Report to [facility] Main  Entrance   Report to admitting at     0515             AM DO NOT BRING INSURANCE CARD, PICTURE ID OR WALLET DAY OF SURGERY.      Call this number if you have problems the morning of surgery [phone]    REMEMBER: NO  SOLID FOODS , CANDY, GUM OR MINTS AFTER MIDNITE THE NITE BEFORE SURGERY .       Marland Kitchen CLEAR LIQUIDS UNTIL     0415am             DAY OF SURGERY.      PLEASE FINISH ENSURE DRINK PER SURGEON ORDER  WHICH NEEDS TO BE COMPLETED AT     0415 am       MORNING OF SURGERY.       CLEAR LIQUID DIET   Foods Allowed      WATER BLACK COFFEE ( SUGAR OK, NO MILK, CREAM OR CREAMER) REGULAR AND DECAF  TEA ( SUGAR OK NO MILK, CREAM, OR CREAMER) REGULAR AND DECAF  PLAIN JELLO ( NO RED)  FRUIT ICES ( NO RED, NO FRUIT PULP)  POPSICLES ( NO RED)  JUICE- APPLE, WHITE GRAPE AND WHITE CRANBERRY  SPORT DRINK LIKE GATORADE ( NO RED)  CLEAR BROTH ( VEGETABLE , CHICKEN OR BEEF)                                                                     BRUSH YOUR TEETH MORNING OF SURGERY AND RINSE YOUR MOUTH OUT, NO CHEWING GUM CANDY OR MINTS.     Take these medicines the morning of surgery with A SIP OF WATER:  none    DO NOT TAKE ANY DIABETIC MEDICATIONS DAY OF YOUR SURGERY                               You may not have any metal on your body including hair pins and              piercings  Do not wear jewelry, make-up, lotions, powders or perfumes, deodorant             Do not wear nail polish on your fingernails.              IF YOU ARE A FEMALE AND WANT TO SHAVE UNDER ARMS OR LEGS PRIOR TO SURGERY YOU  MUST DO SO AT LEAST 48 HOURS PRIOR TO SURGERY.              Men may shave face and neck.   Do not bring valuables to the hospital. [facility] IS NOT  RESPONSIBLE   FOR VALUABLES.  Contacts, dentures or bridgework may not be worn into surgery.  Leave suitcase in the car. After surgery it may be brought to your room.     Patients discharged the day of surgery will not be allowed to drive home. IF YOU ARE HAVING SURGERY AND GOING HOME THE SAME DAY, YOU MUST HAVE AN ADULT TO DRIVE YOU HOME AND BE WITH YOU FOR 24 HOURS. YOU MAY GO HOME BY TAXI OR UBER OR ORTHERWISE, BUT AN ADULT MUST ACCOMPANY YOU HOME AND STAY WITH YOU FOR 24 HOURS.                Please read over the following fact sheets you were given: _____________________________________________________________________  [facility] - Preparing for Surgery Before surgery, you can play an important role.  Because skin is not sterile, your skin needs to be as free of germs as possible.  You can reduce the number of germs on your skin by washing with CHG (chlorahexidine gluconate) soap before surgery.  CHG is an antiseptic cleaner which kills germs and bonds with the skin to continue killing germs even after washing. Please DO NOT use if you have an allergy to CHG or antibacterial soaps.  If your skin becomes reddened/irritated stop using the CHG and inform your nurse when you arrive at Short Stay. Do not shave (including legs and underarms) for at least 48 hours prior to the first CHG shower.  You may shave your face/neck. Please follow these instructions carefully:  1.  Shower with CHG Soap the night before surgery and the  morning of Surgery.  2.  If you choose to wash your hair, wash your hair first as usual with your  normal  shampoo.  3.  After you shampoo, rinse your hair and body thoroughly to remove the  shampoo.                           4.  Use CHG as you would any other liquid soap.  You can apply chg directly  to the skin  and wash                       Gently with a scrungie or clean washcloth.  5.  Apply the CHG Soap to your body ONLY FROM THE NECK DOWN.   Do not use on face/ open                           Wound or open sores. Avoid contact with eyes, ears mouth and genitals (private parts).                       Wash face,  Genitals (private parts) with your normal soap.             6.  Wash thoroughly, paying special attention to the area where your surgery  will be performed.  7.  Thoroughly rinse your body with warm water from the neck down.  8.  DO NOT shower/wash with your normal soap after using and rinsing off  the CHG Soap.                9.  Pat yourself dry with a clean towel.            10.  Wear clean pajamas.  11.  Place clean sheets on your bed the night of your first shower and do not  sleep with pets. Day of Surgery : Do not apply any lotions/deodorants the morning of surgery.  Please wear clean clothes to the hospital/surgery center.  FAILURE TO FOLLOW THESE INSTRUCTIONS MAY RESULT IN THE CANCELLATION OF YOUR SURGERY PATIENT SIGNATURE_________________________________  NURSE SIGNATURE__________________________________  ________________________________________________________________________

## 2022-04-10 ENCOUNTER — Other Ambulatory Visit: Payer: Self-pay

## 2022-04-10 ENCOUNTER — Encounter (HOSPITAL_COMMUNITY)
Admission: RE | Admit: 2022-04-10 | Discharge: 2022-04-10 | Disposition: A | Discharge status: Home / Self Care | Payer: Medicare Other | Source: Ambulatory Visit | Attending: Orthopedic Surgery | Admitting: Orthopedic Surgery

## 2022-04-10 ENCOUNTER — Encounter (HOSPITAL_COMMUNITY): Payer: Self-pay

## 2022-04-10 ENCOUNTER — Telehealth: Payer: Self-pay

## 2022-04-10 DIAGNOSIS — Z01818 Encounter for other preprocedural examination: Secondary | ICD-10-CM

## 2022-04-10 DIAGNOSIS — Z96651 Presence of right artificial knee joint: Secondary | ICD-10-CM | POA: Diagnosis not present

## 2022-04-10 LAB — CBC
HCT: 44.6 % (ref 36.0–46.0)
Hemoglobin: 14.2 g/dL (ref 12.0–15.0)
MCH: 28.9 pg (ref 26.0–34.0)
MCHC: 31.8 g/dL (ref 30.0–36.0)
MCV: 90.8 fL (ref 80.0–100.0)
Platelets: 370 10*3/uL (ref 150–400)
RBC: 4.91 MIL/uL (ref 3.87–5.11)
RDW: 12.2 % (ref 11.5–15.5)
WBC: 9.1 10*3/uL (ref 4.0–10.5)
nRBC: 0 % (ref 0.0–0.2)

## 2022-04-10 LAB — BASIC METABOLIC PANEL
Anion gap: 8 (ref 5–15)
BUN: 11 mg/dL (ref 8–23)
CO2: 25 mmol/L (ref 22–32)
Calcium: 9.7 mg/dL (ref 8.9–10.3)
Chloride: 108 mmol/L (ref 98–111)
Creatinine, Ser: 0.45 mg/dL (ref 0.44–1.00)
GFR, Estimated: >60 mL/min (ref >60)
Glucose, Bld: 110 mg/dL — ABNORMAL HIGH (ref 70–99)
Potassium: 4.1 mmol/L (ref 3.5–5.1)
Sodium: 141 mmol/L (ref 135–145)

## 2022-04-10 LAB — TYPE AND SCREEN
ABO/RH(D): O POS
Antibody Screen: NEGATIVE

## 2022-04-10 NOTE — Telephone Encounter (Signed)
---  Caller states she went for labs and her b/p was elevated 145/102 An EKG was done and she was told to go to PCP office no medication taken had a headache since bedtime last time headache came on suddenly and continues.

## 2022-04-10 NOTE — Telephone Encounter (Signed)
Spoke with pt. Pt is currently in a lot of pain due to knee/upcoming surgery. She has taken tylenol and has had some relief. Pt does not have a BP cuff at home. Advised pt going to UC or the ED would be in her best interest, pt refused. Pt wants to see how she is feeling later. Advised pt to seek emergency care if symptoms worsen. Pt states she will give [place] a call in the morning if she feels she needs an appointment.

## 2022-04-18 ENCOUNTER — Telehealth: Payer: Self-pay | Admitting: Family Medicine

## 2022-04-18 NOTE — Telephone Encounter (Signed)
Patient's daughter called stating her mom is out of her Norco and she is going to be getting surgery soon. She would like a call back to discuss what medication her mom should take since she will be needing a stronger one after the surgery.  Please advise.

## 2022-04-19 ENCOUNTER — Other Ambulatory Visit: Payer: Self-pay

## 2022-04-19 ENCOUNTER — Other Ambulatory Visit: Payer: Self-pay | Admitting: Family Medicine

## 2022-04-19 DIAGNOSIS — M170 Bilateral primary osteoarthritis of knee: Secondary | ICD-10-CM

## 2022-04-19 MED ORDER — HYDROCODONE-ACETAMINOPHEN 5-325 MG PO TABS: 1.0000 | ORAL_TABLET | Freq: Three times a day (TID) | ORAL | 0 refills | Status: DC | PRN

## 2022-04-19 NOTE — Progress Notes (Unsigned)
Requesting: Norco 5-325 MG Contract: 01/10/22 UDS: 07/31/21 Last Visit: 01/10/22 Next Visit: not scheduled Last Refill: 03/29/22  Please Advise

## 2022-04-19 NOTE — Telephone Encounter (Signed)
Request sent to pcp.

## 2022-04-19 NOTE — Telephone Encounter (Signed)
Pt's daughter called back and stated she was wanting enough pills to last until Monday as she is out. Please advise.

## 2022-04-22 NOTE — Anesthesia Preprocedure Evaluation (Signed)
Anesthesia Evaluation  Patient identified by MRN, date of birth, ID band Patient awake    Reviewed: Allergy & Precautions, NPO status , Patient's Chart, lab work & pertinent test results  History of Anesthesia Complications Negative for: history of anesthetic complications  Airway Mallampati: II  TM Distance: >3 FB Neck ROM: Full    Dental  (+) Edentulous Upper, Edentulous Lower   Pulmonary neg pulmonary ROS,    Pulmonary exam normal breath sounds clear to auscultation       Cardiovascular negative cardio ROS   Rhythm:Regular Rate:Normal     Neuro/Psych negative neurological ROS  negative psych ROS   GI/Hepatic negative GI ROS, Neg liver ROS,   Endo/Other  negative endocrine ROS  Renal/GU negative Renal ROS     Musculoskeletal  (+) Arthritis ,   Abdominal   Peds  Hematology negative hematology ROS (+)   Anesthesia Other Findings Day of surgery medications reviewed with patient.  Reproductive/Obstetrics negative OB ROS                            Anesthesia Physical  Anesthesia Plan  ASA: 2  Anesthesia Plan: General   Post-op Pain Management:  Regional for Post-op pain and Tylenol PO (pre-op)* and Minimal or no pain anticipated   Induction: Intravenous  PONV Risk Score and Plan: 3 and Treatment may vary due to age or medical condition, Ondansetron and Dexamethasone  Airway Management Planned: LMA  Additional Equipment: None  Intra-op Plan:   Post-operative Plan: Extubation in OR  Informed Consent: I have reviewed the patients History and Physical, chart, labs and discussed the procedure including the risks, benefits and alternatives for the proposed anesthesia with the patient or authorized representative who has indicated his/her understanding and acceptance.     Dental advisory given  Plan Discussed with: CRNA  Anesthesia Plan Comments:       Anesthesia Quick  Evaluation

## 2022-04-23 ENCOUNTER — Encounter (HOSPITAL_COMMUNITY)
Admission: RE | Disposition: A | Discharge status: Home / Self Care | Payer: Self-pay | Source: Home / Self Care | Attending: Orthopedic Surgery

## 2022-04-23 ENCOUNTER — Other Ambulatory Visit: Payer: Self-pay

## 2022-04-23 ENCOUNTER — Encounter (HOSPITAL_COMMUNITY): Payer: Self-pay | Admitting: Orthopedic Surgery

## 2022-04-23 ENCOUNTER — Ambulatory Visit (HOSPITAL_COMMUNITY)
Admission: RE | Admit: 2022-04-23 | Discharge: 2022-04-23 | Disposition: A | Discharge status: Home / Self Care | Payer: Medicare Other | Attending: Orthopedic Surgery | Admitting: Orthopedic Surgery

## 2022-04-23 ENCOUNTER — Ambulatory Visit (HOSPITAL_COMMUNITY): Payer: Medicare Other | Admitting: Physician Assistant

## 2022-04-23 ENCOUNTER — Ambulatory Visit (HOSPITAL_BASED_OUTPATIENT_CLINIC_OR_DEPARTMENT_OTHER): Payer: Medicare Other | Admitting: Certified Registered Nurse Anesthetist

## 2022-04-23 DIAGNOSIS — Z96651 Presence of right artificial knee joint: Secondary | ICD-10-CM | POA: Diagnosis not present

## 2022-04-23 DIAGNOSIS — M85661 Other cyst of bone, right lower leg: Secondary | ICD-10-CM | POA: Insufficient documentation

## 2022-04-23 DIAGNOSIS — T8484XA Pain due to internal orthopedic prosthetic devices, implants and grafts, initial encounter: Secondary | ICD-10-CM

## 2022-04-23 DIAGNOSIS — Z9889 Other specified postprocedural states: Secondary | ICD-10-CM

## 2022-04-23 DIAGNOSIS — M89361 Hypertrophy of bone, right tibia: Secondary | ICD-10-CM | POA: Diagnosis not present

## 2022-04-23 DIAGNOSIS — M898X6 Other specified disorders of bone, lower leg: Secondary | ICD-10-CM | POA: Diagnosis not present

## 2022-04-23 DIAGNOSIS — Z01818 Encounter for other preprocedural examination: Secondary | ICD-10-CM

## 2022-04-23 HISTORY — PX: BONE EXCISION: SHX6730

## 2022-04-23 HISTORY — DX: Other specified postprocedural states: Z98.890

## 2022-04-23 SURGERY — BONE EXCISION
Anesthesia: General | Laterality: Right

## 2022-04-23 MED ORDER — DEXAMETHASONE SODIUM PHOSPHATE 10 MG/ML IJ SOLN
INTRAMUSCULAR | Status: DC | PRN
  Administered 2022-04-23: 5 mg via INTRAVENOUS

## 2022-04-23 MED ORDER — CHLORHEXIDINE GLUCONATE 0.12 % MT SOLN
15.0000 mL | Freq: Once | OROMUCOSAL | Status: AC
  Administered 2022-04-23: 15 mL via OROMUCOSAL

## 2022-04-23 MED ORDER — PROMETHAZINE HCL 25 MG/ML IJ SOLN: 6.2500 mg | INTRAMUSCULAR | Status: DC | PRN

## 2022-04-23 MED ORDER — ASPIRIN 81 MG PO CHEW: 81.0000 mg | CHEWABLE_TABLET | Freq: Two times a day (BID) | ORAL | 0 refills | Status: AC

## 2022-04-23 MED ORDER — OXYCODONE HCL 5 MG PO TABS: 5.0000 mg | ORAL_TABLET | Freq: Once | ORAL | Status: DC | PRN

## 2022-04-23 MED ORDER — BUPIVACAINE-EPINEPHRINE (PF) 0.25% -1:200000 IJ SOLN
INTRAMUSCULAR | Status: AC
Start: 2022-04-23 — End: ?
  Filled 2022-04-23: qty 30

## 2022-04-23 MED ORDER — DEXAMETHASONE SODIUM PHOSPHATE 10 MG/ML IJ SOLN
INTRAMUSCULAR | Status: AC
  Filled 2022-04-23: qty 1

## 2022-04-23 MED ORDER — FENTANYL CITRATE (PF) 100 MCG/2ML IJ SOLN
INTRAMUSCULAR | Status: AC
  Filled 2022-04-23: qty 2

## 2022-04-23 MED ORDER — ONDANSETRON HCL 4 MG/2ML IJ SOLN
INTRAMUSCULAR | Status: DC | PRN
  Administered 2022-04-23: 4 mg via INTRAVENOUS

## 2022-04-23 MED ORDER — FENTANYL CITRATE PF 50 MCG/ML IJ SOSY: 25.0000 ug | PREFILLED_SYRINGE | INTRAMUSCULAR | Status: DC | PRN

## 2022-04-23 MED ORDER — LACTATED RINGERS IV SOLN: INTRAVENOUS | Status: DC

## 2022-04-23 MED ORDER — OXYCODONE HCL 5 MG/5ML PO SOLN: 5.0000 mg | Freq: Once | ORAL | Status: DC | PRN

## 2022-04-23 MED ORDER — LIDOCAINE 2% (20 MG/ML) 5 ML SYRINGE
INTRAMUSCULAR | Status: DC | PRN
  Administered 2022-04-23: 60 mg via INTRAVENOUS

## 2022-04-23 MED ORDER — PROPOFOL 10 MG/ML IV BOLUS
INTRAVENOUS | Status: AC
Start: 2022-04-23 — End: ?
  Filled 2022-04-23: qty 20

## 2022-04-23 MED ORDER — HYDROCODONE-ACETAMINOPHEN 7.5-325 MG PO TABS: 1.0000 | ORAL_TABLET | Freq: Four times a day (QID) | ORAL | 0 refills | Status: DC | PRN

## 2022-04-23 MED ORDER — ONDANSETRON HCL 4 MG/2ML IJ SOLN
INTRAMUSCULAR | Status: AC
  Filled 2022-04-23: qty 2

## 2022-04-23 MED ORDER — CEFAZOLIN SODIUM-DEXTROSE 2-4 GM/100ML-% IV SOLN
2.0000 g | INTRAVENOUS | Status: AC
  Administered 2022-04-23: 2 g via INTRAVENOUS
  Filled 2022-04-23: qty 100

## 2022-04-23 MED ORDER — POVIDONE-IODINE 10 % EX SWAB: 2.0000 | Freq: Once | CUTANEOUS | Status: DC

## 2022-04-23 MED ORDER — METHOCARBAMOL 500 MG PO TABS: 500.0000 mg | ORAL_TABLET | Freq: Four times a day (QID) | ORAL | Status: DC | PRN

## 2022-04-23 MED ORDER — LACTATED RINGERS IV BOLUS
500.0000 mL | Freq: Once | INTRAVENOUS | Status: AC
  Administered 2022-04-23: 500 mL via INTRAVENOUS

## 2022-04-23 MED ORDER — BUPIVACAINE-EPINEPHRINE (PF) 0.25% -1:200000 IJ SOLN
INTRAMUSCULAR | Status: DC | PRN
  Administered 2022-04-23: 30 mL

## 2022-04-23 MED ORDER — PROPOFOL 10 MG/ML IV BOLUS
INTRAVENOUS | Status: DC | PRN
  Administered 2022-04-23: 120 mg via INTRAVENOUS

## 2022-04-23 MED ORDER — ORAL CARE MOUTH RINSE: 15.0000 mL | Freq: Once | OROMUCOSAL | Status: AC

## 2022-04-23 MED ORDER — METHOCARBAMOL 500 MG IVPB - SIMPLE MED: 500.0000 mg | Freq: Four times a day (QID) | INTRAVENOUS | Status: DC | PRN

## 2022-04-23 MED ORDER — SODIUM CHLORIDE (PF) 0.9 % IJ SOLN
INTRAMUSCULAR | Status: AC
Start: 2022-04-23 — End: ?
  Filled 2022-04-23: qty 50

## 2022-04-23 MED ORDER — KETOROLAC TROMETHAMINE 30 MG/ML IJ SOLN
INTRAMUSCULAR | Status: AC
Start: 2022-04-23 — End: ?
  Filled 2022-04-23: qty 1

## 2022-04-23 MED ORDER — ACETAMINOPHEN 500 MG PO TABS
1000.0000 mg | ORAL_TABLET | Freq: Once | ORAL | Status: AC
  Administered 2022-04-23: 1000 mg via ORAL
  Filled 2022-04-23: qty 2

## 2022-04-23 MED ORDER — FENTANYL CITRATE (PF) 100 MCG/2ML IJ SOLN
INTRAMUSCULAR | Status: DC | PRN
  Administered 2022-04-23 (×3): 50 ug via INTRAVENOUS

## 2022-04-23 SURGICAL SUPPLY — 51 items
BAG COUNTER SPONGE SURGICOUNT (BAG) IMPLANT
BAG ZIPLOCK 12X15 (MISCELLANEOUS) IMPLANT
BLADE SAW SGTL 11.0X1.19X90.0M (BLADE) IMPLANT
BLADE SAW SGTL 13.0X1.19X90.0M (BLADE) ×2 IMPLANT
BNDG ELASTIC 6X10 VLCR STRL LF (GAUZE/BANDAGES/DRESSINGS) ×1 IMPLANT
BNDG ELASTIC 6X5.8 VLCR STR LF (GAUZE/BANDAGES/DRESSINGS) ×2 IMPLANT
BOWL SMART MIX CTS (DISPOSABLE) ×2 IMPLANT
CUFF TOURN SGL QUICK 34 (TOURNIQUET CUFF) ×1
CUFF TRNQT CYL 34X4.125X (TOURNIQUET CUFF) ×1 IMPLANT
DERMABOND ADVANCED (GAUZE/BANDAGES/DRESSINGS) ×1
DERMABOND ADVANCED .7 DNX12 (GAUZE/BANDAGES/DRESSINGS) ×1 IMPLANT
DRAPE SHEET LG 3/4 BI-LAMINATE (DRAPES) ×2 IMPLANT
DRAPE U-SHAPE 47X51 STRL (DRAPES) ×2 IMPLANT
DRESSING AQUACEL AG SP 3.5X10 (GAUZE/BANDAGES/DRESSINGS) ×1 IMPLANT
DRSG AQUACEL AG ADV 3.5X 6 (GAUZE/BANDAGES/DRESSINGS) ×1 IMPLANT
DRSG AQUACEL AG SP 3.5X10 (GAUZE/BANDAGES/DRESSINGS) ×2
DRSG PAD ABDOMINAL 8X10 ST (GAUZE/BANDAGES/DRESSINGS) ×1 IMPLANT
DURAPREP 26ML APPLICATOR (WOUND CARE) ×3 IMPLANT
ELECT REM PT RETURN 15FT ADLT (MISCELLANEOUS) ×2 IMPLANT
GLOVE BIO SURGEON STRL SZ 6 (GLOVE) ×2 IMPLANT
GLOVE BIOGEL PI IND STRL 6.5 (GLOVE) ×1 IMPLANT
GLOVE BIOGEL PI IND STRL 7.5 (GLOVE) ×1 IMPLANT
GLOVE BIOGEL PI INDICATOR 6.5 (GLOVE) ×1
GLOVE BIOGEL PI INDICATOR 7.5 (GLOVE) ×1
GLOVE ORTHO TXT STRL SZ7.5 (GLOVE) ×4 IMPLANT
GOWN STRL REUS W/ TWL LRG LVL3 (GOWN DISPOSABLE) ×2 IMPLANT
GOWN STRL REUS W/TWL LRG LVL3 (GOWN DISPOSABLE) ×2
HANDPIECE INTERPULSE COAX TIP (DISPOSABLE) ×1
HOLDER FOLEY CATH W/STRAP (MISCELLANEOUS) IMPLANT
KIT TURNOVER KIT A (KITS) IMPLANT
MANIFOLD NEPTUNE II (INSTRUMENTS) ×2 IMPLANT
NDL SAFETY ECLIPSE 18X1.5 (NEEDLE) IMPLANT
NEEDLE HYPO 18GX1.5 SHARP (NEEDLE)
NS IRRIG 1000ML POUR BTL (IV SOLUTION) ×2 IMPLANT
PACK TOTAL KNEE CUSTOM (KITS) ×2 IMPLANT
PROTECTOR NERVE ULNAR (MISCELLANEOUS) ×2 IMPLANT
SET HNDPC FAN SPRY TIP SCT (DISPOSABLE) ×1 IMPLANT
SET PAD KNEE POSITIONER (MISCELLANEOUS) ×2 IMPLANT
SPIKE FLUID TRANSFER (MISCELLANEOUS) ×4 IMPLANT
SUT MNCRL AB 4-0 PS2 18 (SUTURE) ×2 IMPLANT
SUT STRATAFIX PDS+ 0 24IN (SUTURE) ×2 IMPLANT
SUT VIC AB 1 CT1 36 (SUTURE) ×2 IMPLANT
SUT VIC AB 2-0 CT1 27 (SUTURE) ×2
SUT VIC AB 2-0 CT1 TAPERPNT 27 (SUTURE) ×2 IMPLANT
SYR 3ML LL SCALE MARK (SYRINGE) ×2 IMPLANT
TOWEL GREEN STERILE FF (TOWEL DISPOSABLE) ×2 IMPLANT
TRAY CATH INTERMITTENT SS 16FR (CATHETERS) ×2 IMPLANT
TRAY FOLEY MTR SLVR 16FR STAT (SET/KITS/TRAYS/PACK) ×2 IMPLANT
TUBE SUCTION HIGH CAP CLEAR NV (SUCTIONS) ×2 IMPLANT
WATER STERILE IRR 1000ML POUR (IV SOLUTION) ×4 IMPLANT
WRAP KNEE MAXI GEL POST OP (GAUZE/BANDAGES/DRESSINGS) ×2 IMPLANT

## 2022-04-23 NOTE — Anesthesia Postprocedure Evaluation (Signed)
Anesthesia Post Note  Patient: Melanie Lee  Procedure(s) Performed: Excision of bony prominence Right total knee (Right)     Patient location during evaluation: PACU Anesthesia Type: General Level of consciousness: sedated and patient cooperative Pain management: pain level controlled Vital Signs Assessment: post-procedure vital signs reviewed and stable Respiratory status: spontaneous breathing Cardiovascular status: stable Anesthetic complications: no   No notable events documented.  Last Vitals:  Vitals:   04/23/22 0845 04/23/22 0903  BP: 131/85 (!) 152/84  Pulse: 76 65  Resp: 14 18  Temp: 36.6 C 36.6 C  SpO2: 93% 95%    Last Pain:  Vitals:   04/23/22 0903  TempSrc:   PainSc: 0-No pain                 Nolon Nations

## 2022-04-23 NOTE — Discharge Instructions (Signed)

## 2022-04-23 NOTE — Anesthesia Procedure Notes (Addendum)
Procedure Name: LMA Insertion Date/Time: 04/23/2022 7:26 AM  Performed by: West Pugh, CRNAPre-anesthesia Checklist: Patient identified, Emergency Drugs available, Suction available, Patient being monitored and Timeout performed Patient Re-evaluated:Patient Re-evaluated prior to induction Oxygen Delivery Method: Circle system utilized Preoxygenation: Pre-oxygenation with 100% oxygen Induction Type: IV induction LMA: LMA with gastric port inserted LMA Size: 4.0 Number of attempts: 1 Placement Confirmation: positive ETCO2 Tube secured with: Tape Dental Injury: Teeth and Oropharynx as per pre-operative assessment

## 2022-04-23 NOTE — Transfer of Care (Signed)
Immediate Anesthesia Transfer of Care Note  Patient: Melanie Lee  Procedure(s) Performed: Excision of bony prominence Right total knee (Right)  Patient Location: PACU  Anesthesia Type:General  Level of Consciousness: awake and patient cooperative  Airway & Oxygen Therapy: Patient Spontanous Breathing and Patient connected to face mask oxygen  Post-op Assessment: Report given to RN and Post -op Vital signs reviewed and stable  Post vital signs: Reviewed and stable  Last Vitals:  Vitals Value Taken Time  BP 137/85 04/23/22 0815  Temp    Pulse 71 04/23/22 0818  Resp 10 04/23/22 0818  SpO2 100 % 04/23/22 0818  Vitals shown include unvalidated device data.  Last Pain:  Vitals:   04/23/22 0604  TempSrc:   PainSc: 0-No pain      Patients Stated Pain Goal: 3 (14/64/31 4276)  Complications: No notable events documented.

## 2022-04-23 NOTE — H&P (Signed)
Melanie Lee is an 76 y.o. female.   Chief Complaint: Bony prominence, right knee HPI: Melanie Lee has a history of right total knee arthroplasty in 2022 by Dr. Alvan Dame. She did well with recovery from this surgery, but has continued to report pain on the anterior lateral proximal tibia. There is an area of bony prominence here. MRI was obtained. MRI of her right distal thigh showed no evidence of any defined soft tissue mass or lipoma. What is present is continuation of subcutaneous adipose tissue along the medial aspect of the thigh to distal.     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    Past Surgical History:  Procedure Laterality Date   ABDOMINAL HYSTERECTOMY  1995   total for fibroids   BREAST BIOPSY  1995   benign   CATARACT EXTRACTION  2013 & 2015   Bilateral   KNEE SURGERY     Left Arthroscopy   right hip replacement      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/2016   Procedure: TOTAL KNEE ARTHROPLASTY;  Surgeon: Latanya Maudlin, MD;  Location: [facility];  Service: Orthopedics;  Laterality: Left;   TOTAL KNEE ARTHROPLASTY Right 12/26/2020   Procedure: TOTAL KNEE ARTHROPLASTY;  Surgeon: Paralee Cancel, MD;  Location: [facility];  Service: Orthopedics;  Laterality: Right;  70 mins    Family History  Problem Relation Age of Onset   Breast cancer Mother 79       Deceased   Heart disease Mother    Hypertension Mother    Hypertension Father        Deceased   Stroke Father    Hypertension Sister    Hypertension Brother    Heart defect Daughter    Hyperlipidemia Daughter    Hypertension Daughter    Vision loss Paternal Uncle    Stroke Brother    Social History:  reports that she has never smoked. She has never used smokeless tobacco. She reports that she does not drink alcohol and does not use  drugs.  Allergies:  Allergies  Allergen Reactions   Tramadol Nausea Only and Other (See Comments)    Dizziness    Medications Prior to Admission  Medication Sig Dispense Refill   conjugated estrogens (PREMARIN) vaginal cream Place 1 Applicatorful vaginally every Monday, Wednesday, and Friday.     cyclobenzaprine (FLEXERIL) 5 MG tablet Take 0.5-1 tablets (2.5-5 mg total) by mouth at bedtime as needed for muscle spasms. 1 tab po q day prn headache 30 tablet 3   fluticasone (FLONASE) 50 MCG/ACT nasal spray Place 2 sprays into both nostrils daily. (Patient taking differently: Place 2 sprays into both nostrils daily as needed for allergies.) 16 g 1   HYDROcodone-acetaminophen (NORCO) 5-325 MG tablet Take 1 tablet by mouth 3 (three) times daily as needed for moderate pain or severe pain. 60 tablet 0   Multiple Vitamins-Minerals (MULTIVITAMIN ADULTS 50+) TABS Take 1 tablet by mouth daily.      rosuvastatin (CRESTOR) 10 MG tablet Take 1 tablet (10 mg total) by mouth daily. 90 tablet 1    No results found for this or any previous visit (from the past 48 hour(s)). No results found.  Review of Systems  Constitutional:  Negative for chills and fever.  Respiratory:  Negative for cough and shortness of  breath.   Cardiovascular:  Negative for chest pain.  Gastrointestinal:  Negative for nausea and vomiting.  Musculoskeletal:  Positive for arthralgias.     Blood pressure (!) 147/89, pulse 82, temperature 98.4 F (36.9 C), temperature source Oral, resp. rate 15, height '5\' 3"'$  (1.6 m), weight 68.5 kg, SpO2 96 %. Physical Exam  Well nourished and well developed. General: Alert and oriented x3, cooperative and pleasant, no acute distress. Head: normocephalic, atraumatic, neck supple. Eyes: EOMI.  Musculoskeletal: Right knee exam: Her surgical incision remains well-healed without signs of infection She does continue to have tenderness over this prominence on the anterior lateral aspect of the  proximal tibia. Medial in the distal thigh there is evidence of adipose tissue as noted previously without erythema or fluctuance  Calves soft and nontender. Motor function intact in LE. Strength 5/5 LE bilaterally. Neuro: Distal pulses 2+. Sensation to light touch intact in LE.  Assessment/Plan  Assessment: Bony prominence, right total knee  Plan: Plan for surgical excision of bony prominence about the lateral tibia. Dr. Alvan Dame reviewed risks, benefits, an expectations of this procedure.   Therapy Plans: none Disposition: Home with daughter Planned DVT Prophylaxis: aspirin '81mg'$  BID ?? DME needed: none PCP: Dr. Penni Homans, TXA: IV Allergies: tramadol - headache Anesthesia Concerns: none BMI: 23 Last HgbA1c: Not diabetic   Other: - hydrocodone 7.5  Irving Copas, PA-C 04/23/2022, 6:35 AM

## 2022-04-23 NOTE — Brief Op Note (Signed)
04/23/2022  8:10 AM  PATIENT:  Melanie Lee  76 y.o. female  PRE-OPERATIVE DIAGNOSIS:  Painful anterior lateral bony ossicle  POST-OPERATIVE DIAGNOSIS:  Painful anterior lateral bony ossicle  PROCEDURE:  Procedure(s): Excision of bony ossicle right knee (Right)  SURGEON:  Surgeon(s) and Role:    Paralee Cancel, MD - Primary  PHYSICIAN ASSISTANT: Costella Hatcher, PA-C  ANESTHESIA:   general  EBL:  minimal  BLOOD ADMINISTERED:none  DRAINS: none   LOCAL MEDICATIONS USED:  MARCAINE     SPECIMEN:  No Specimen  DISPOSITION OF SPECIMEN:  N/A  COUNTS:  YES  TOURNIQUET:   Total Tourniquet Time Documented: Thigh (Right) - 11 minutes Total: Thigh (Right) - 11 minutes   DICTATION: .Other Dictation: Dictation Number [id number]  PLAN OF CARE: Discharge to home after PACU  PATIENT DISPOSITION:  PACU - hemodynamically stable.   Delay start of Pharmacological VTE agent (>24hrs) due to surgical blood loss or risk of bleeding: not applicable

## 2022-04-23 NOTE — Op Note (Signed)
Melanie Lee, SCHAFFER MEDICAL RECORD NO: [id number] ACCOUNT NO: [id number] DATE OF BIRTH: [birth year] FACILITY: [facility] LOCATION: [facility] PHYSICIAN: Pietro Cassis. Alvan Dame, MD  Operative Report   DATE OF PROCEDURE: 04/23/2022   PREOPERATIVE DIAGNOSIS:  Painful bony ossicle on the anterolateral aspect of her proximal tibia, with history of right total knee arthroplasty.  POSTOPERATIVE DIAGNOSIS:  Painful bony ossicle on the anterolateral aspect of her proximal tibia, with history of right total knee arthroplasty.  PROCEDURE:  Excision of painful bony ossicle in the anterior lateral knee at the area of the insertion of her iliotibial banding, Gerdy's tubercle.  SURGEON:  Pietro Cassis. Alvan Dame, MD  ASSISTANT:  Costella Hatcher, PA-C.  Note that [name] was present for the entirety of the case from preoperative positioning, perioperative management of the operative extremity, general facilitation of the case and primary wound closure.  ANESTHESIA:  General.  BLOOD LOSS:  Minimal as the tourniquet was up.  Tourniquet was up for 11 minutes at 225 mmHg.  I did inject the area locally with 0.25% Marcaine with epinephrine at the end of the case.  INDICATIONS FOR THE PROCEDURE:  The patient is a very pleasant 76 year old female with a history of right total knee replacement.  We have been following in the office over the past year or so a prominence that has developed post-procedure at the area of  Gerdy's tubercle.  This was very painful to touch.  She was having pain with ambulation.  We had tried conservative treatment.  Local injections to define the source of pain.  Given the persistence of her discomfort, she wished to have this excised.  I  did review serial radiographs of her knee, indicating the development of this prominence with time.  We did in fact do an MRI of her knee because she was concerned about some swelling in the medial distal thigh to rule out a lipoma.  There was no  evidence of any  benign or other type lesions involving her distal thigh.  We reviewed the risks of infection be in minimal.  We reviewed the risk of recurrence, unable to define an exact percentage of concern.  She wished to proceed.  Consent was  obtained for benefit of management of this.  DESCRIPTION OF PROCEDURE:  The patient was brought to the operative theater.  Once adequate anesthesia, preoperative antibiotics, Ancef administered, she was positioned supine.  A thigh tourniquet was placed.  The right lower extremity was then prepped  and draped in sterile fashion.  A timeout was performed identifying the patient, planned procedure, and extremity.  The area of prominence was marked.  The tourniquet was elevated to 225 mmHg.  An incision was made over the prominence.  Soft  tissue planes created.  What we identified at that point was capsular tissues.  I incised this over top of this bony prominence.  I then using the Bovie was able to sharply dissect the soft tissues off of this prominence clearly defining probably about  8-10 mm area of prominence that was raised off of her lateral anterior proximal tibia.  I used an osteotome to remove this ossicle.  I then used a bare rongeur in order to remove further bone.  At this point, the proximal anterolateral tibia was smoothed  to the normal anatomy of the proximal tibia.  We then irrigated the wound.  I reapproximated the fascia and capsular layer using #1 Vicryl.  We then injected the area with Marcaine.  She was noted to have  some oozing from the bone in this area.  We then  closed the skin in layers with 2-0 Vicryl and a running Monocryl stitch.  The wound was clean, dry and dressed sterilely using surgical glue and Aquacel dressing.  We applied somewhat of a compression dressing with Ace wrap.  Postoperatively, she was transferred to recovery room in stable condition.  She will be discharged from the recovery room with plans to follow up with [place] in 12-14 days.  I  did notify her daughter regarding the bony oozing and if they have concerns  regarding swelling that she could be seen in the office for aspiration.     [name] D: 04/23/2022 8:18:31 am T: 04/23/2022 8:41:00 am  JOB: [id number]/ [id number]

## 2022-04-24 ENCOUNTER — Encounter (HOSPITAL_COMMUNITY): Payer: Self-pay | Admitting: Orthopedic Surgery

## 2022-05-06 DIAGNOSIS — N3281 Overactive bladder: Secondary | ICD-10-CM | POA: Diagnosis not present

## 2022-05-20 ENCOUNTER — Other Ambulatory Visit: Payer: Self-pay | Admitting: Family Medicine

## 2022-05-20 ENCOUNTER — Telehealth: Payer: Self-pay | Admitting: Family Medicine

## 2022-05-20 MED ORDER — HYDROCODONE-ACETAMINOPHEN 7.5-325 MG PO TABS: 1.0000 | ORAL_TABLET | Freq: Four times a day (QID) | ORAL | 0 refills | Status: DC | PRN

## 2022-05-20 NOTE — Telephone Encounter (Signed)
Medication:   HYDROcodone-acetaminophen (NORCO) 7.5-325 MG tablet [[id number]]   Has the patient contacted their pharmacy? No. (If no, request that the patient contact the pharmacy for the refill.) (If yes, when and what did the pharmacy advise?)  Preferred Pharmacy (with phone number or street name):   [id number] - [address], [place] [address]  Phone:  [phone]  Fax:  [phone]   Agent: Please be advised that RX refills may take up to 3 business days. We ask that you follow-up with your pharmacy.

## 2022-05-20 NOTE — Telephone Encounter (Signed)
Requesting: Norco 7.5-'325mg'$  Contract: 07/31/2021 UDS: 07/31/2021 Last Visit: 01/10/22 w/Edward for pain Next Visit: None Last Refill: 04/23/2022  #30 x 0RF  Please Advise

## 2022-06-04 ENCOUNTER — Telehealth: Payer: Self-pay | Admitting: Family Medicine

## 2022-06-04 ENCOUNTER — Other Ambulatory Visit: Payer: Self-pay | Admitting: Family Medicine

## 2022-06-04 MED ORDER — HYDROCODONE-ACETAMINOPHEN 7.5-325 MG PO TABS: 1.0000 | ORAL_TABLET | Freq: Four times a day (QID) | ORAL | 0 refills | Status: DC | PRN

## 2022-06-04 NOTE — Telephone Encounter (Signed)
Medication:   HYDROcodone-acetaminophen (NORCO) 7.5-325 MG tablet [[id number]]   Has the patient contacted their pharmacy? No. (If no, request that the patient contact the pharmacy for the refill.) (If yes, when and what did the pharmacy advise?)  Preferred Pharmacy (with phone number or street name):   [id number] - [address], [place] [address]  Phone:  [phone]  Fax:  [phone]   Agent: Please be advised that RX refills may take up to 3 business days. We ask that you follow-up with your pharmacy.

## 2022-06-05 NOTE — Telephone Encounter (Signed)
Hey next available isn't till  nov is that ok

## 2022-06-05 NOTE — Telephone Encounter (Signed)
Hi Dr.Blyth called she can do telephone call Visit.  Don't have smart phone are computer appt was made

## 2022-06-07 ENCOUNTER — Encounter: Payer: Self-pay | Admitting: Family Medicine

## 2022-06-07 ENCOUNTER — Telehealth (INDEPENDENT_AMBULATORY_CARE_PROVIDER_SITE_OTHER): Payer: Medicare Other | Admitting: Family Medicine

## 2022-06-07 ENCOUNTER — Other Ambulatory Visit: Payer: Self-pay

## 2022-06-07 DIAGNOSIS — M2550 Pain in unspecified joint: Secondary | ICD-10-CM

## 2022-06-07 DIAGNOSIS — Z96651 Presence of right artificial knee joint: Secondary | ICD-10-CM | POA: Diagnosis not present

## 2022-06-07 DIAGNOSIS — E785 Hyperlipidemia, unspecified: Secondary | ICD-10-CM | POA: Diagnosis not present

## 2022-06-07 DIAGNOSIS — R739 Hyperglycemia, unspecified: Secondary | ICD-10-CM | POA: Diagnosis not present

## 2022-06-07 DIAGNOSIS — E559 Vitamin D deficiency, unspecified: Secondary | ICD-10-CM | POA: Diagnosis not present

## 2022-06-07 MED ORDER — HYDROCODONE-ACETAMINOPHEN 7.5-325 MG PO TABS: 1.0000 | ORAL_TABLET | Freq: Three times a day (TID) | ORAL | 0 refills | Status: DC | PRN

## 2022-06-07 NOTE — Progress Notes (Signed)
Virtual telephone visit    Virtual Visit via Telephone Note   This visit type was conducted due to national recommendations for restrictions regarding the COVID-19 Pandemic (e.g. social distancing) in an effort to limit this patient's exposure and mitigate transmission in our community. Due to her co-morbid illnesses, this patient is at least at moderate risk for complications without adequate follow up. This format is felt to be most appropriate for this patient at this time. The patient did not have access to video technology or had technical difficulties with video requiring transitioning to audio format only (telephone). Physical exam was limited to content and character of the telephone converstion. [name], CMA was able to get the patient set up on a telephone visit.   Patient location: home Patient and provider in visit Provider location: Office  I discussed the limitations of evaluation and management by telemedicine and the availability of in person appointments. The patient expressed understanding and agreed to proceed.   Visit Date: 06/07/2022  Today's healthcare provider: Penni Homans, MD     Subjective:    Patient ID: Melanie Lee, female    DOB: 1946/01/23, 76 y.o.   MRN: [id number]  Chief Complaint  Patient presents with   Medication Refill    Discuss Medication      HPI Patient is in today for follow up on chronic medical concerns. No recent febrile illness or hospitalizations. Unfortunately she continues to struggle with daily pain most notably in her knees. Her pain meds are helpful and allow her to complete her needed activities. No concerning side effects. She had to have surgeryDenies CP/palp/SOB/HA/congestion/fevers/GI or GU c/o. Taking meds as prescribed She underwent a RIght total knee replacement last month and is improving over time.   Past Medical History:  Diagnosis Date   Elevated blood pressure (not hypertension)    "elevates sometimes with MD  visits"   H/O measles    Osteoarthritis (arthritis due to wear and tear of joints)    Bilateral knees   S/P excision of bony prominence, right knee 04/23/2022    Past Surgical History:  Procedure Laterality Date   ABDOMINAL HYSTERECTOMY  1995   total for fibroids   BONE EXCISION Right 04/23/2022   Procedure: Excision of bony prominence Right total knee;  Surgeon: Paralee Cancel, MD;  Location: [facility];  Service: Orthopedics;  Laterality: Right;   BREAST BIOPSY  1995   benign   CATARACT EXTRACTION  2013 & 2015   Bilateral   KNEE SURGERY     Left Arthroscopy   right hip replacement      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/2016   Procedure: TOTAL KNEE ARTHROPLASTY;  Surgeon: Latanya Maudlin, MD;  Location: [facility];  Service: Orthopedics;  Laterality: Left;   TOTAL KNEE ARTHROPLASTY Right 12/26/2020   Procedure: TOTAL KNEE ARTHROPLASTY;  Surgeon: Paralee Cancel, MD;  Location: [facility];  Service: Orthopedics;  Laterality: Right;  70 mins    Family History  Problem Relation Age of Onset   Breast cancer Mother 64       Deceased   Heart disease Mother    Hypertension Mother    Hypertension Father        Deceased   Stroke Father    Hypertension Sister    Hypertension Brother    Heart defect Daughter    Hyperlipidemia Daughter    Hypertension Daughter    Vision loss Paternal Uncle    Stroke Brother     Social History  Socioeconomic History   Marital status: Single    Spouse name: Not on file   Number of children: Not on file   Years of education: Not on file   Highest education level: Not on file  Occupational History   Not on file  Tobacco Use   Smoking status: Never   Smokeless tobacco: Never  Vaping Use   Vaping Use: Never used  Substance and Sexual Activity   Alcohol use: Never   Drug use: No   Sexual activity: Not Currently  Other Topics Concern   Not on file  Social History Narrative   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      Right handed   Social Determinants of  Health   Financial Resource Strain: Medium Risk (01/29/2022)   Overall Financial Resource Strain (CARDIA)    Difficulty of Paying Living Expenses: Somewhat hard  Food Insecurity: No Food Insecurity (01/29/2022)   Hunger Vital Sign    Worried About Running Out of Food in the Last Year: Never true    Ran Out of Food in the Last Year: Never true  Transportation Needs: No Transportation Needs (01/29/2022)   PRAPARE - Hydrologist (Medical): No    Lack of Transportation (Non-Medical): No  Physical Activity: Inactive (01/29/2022)   Exercise Vital Sign    Days of Exercise per Week: 0 days    Minutes of Exercise per Session: 0 min  Stress: Stress Concern Present (01/29/2022)   [place]    Feeling of Stress : To some extent  Social Connections: Moderately Isolated (01/29/2022)   Social Connection and Isolation Panel [NHANES]    Frequency of Communication with Friends and Family: More than three times a week    Frequency of Social Gatherings with Friends and Family: Three times a week    Attends Religious Services: More than 4 times per year    Active Member of Clubs or Organizations: No    Attends Archivist Meetings: Never    Marital Status: Never married  Intimate Partner Violence: Not At Risk (01/29/2022)   Humiliation, Afraid, Rape, and Kick questionnaire    Fear of Current or Ex-Partner: No    Emotionally Abused: No    Physically Abused: No    Sexually Abused: No    Outpatient Medications Prior to Visit  Medication Sig Dispense Refill   conjugated estrogens (PREMARIN) vaginal cream Place 1 Applicatorful vaginally every Monday, Wednesday, and Friday.     cyclobenzaprine (FLEXERIL) 5 MG tablet Take 0.5-1 tablets (2.5-5 mg total) by mouth at bedtime as needed for muscle spasms. 1 tab po q day prn headache 30 tablet 3   fluticasone (FLONASE) 50 MCG/ACT nasal spray Place 2 sprays into  both nostrils daily. (Patient taking differently: Place 2 sprays into both nostrils daily as needed for allergies.) 16 g 1   Multiple Vitamins-Minerals (MULTIVITAMIN ADULTS 50+) TABS Take 1 tablet by mouth daily.      rosuvastatin (CRESTOR) 10 MG tablet Take 1 tablet (10 mg total) by mouth daily. 90 tablet 1   HYDROcodone-acetaminophen (NORCO) 7.5-325 MG tablet Take 1 tablet by mouth every 6 (six) hours as needed for severe pain. 30 tablet 0   No facility-administered medications prior to visit.    Allergies  Allergen Reactions   Tramadol Nausea Only and Other (See Comments)    Dizziness    Review of Systems  Constitutional:  Negative for fever and malaise/fatigue.  HENT:  Negative for congestion.   Eyes:  Negative for blurred vision.  Respiratory:  Negative for shortness of breath.   Cardiovascular:  Negative for chest pain, palpitations and leg swelling.  Gastrointestinal:  Negative for abdominal pain, blood in stool and nausea.  Genitourinary:  Negative for dysuria and frequency.  Musculoskeletal:  Negative for falls.  Skin:  Negative for rash.  Neurological:  Negative for dizziness, loss of consciousness and headaches.  Endo/Heme/Allergies:  Negative for environmental allergies.  Psychiatric/Behavioral:  Negative for depression. The patient is not nervous/anxious.        Objective:    Physical Exam unable to obtain via phone visit  There were no vitals taken for this visit. Wt Readings from Last 3 Encounters:  04/23/22 151 lb (68.5 kg)  04/10/22 151 lb (68.5 kg)  01/10/22 151 lb (68.5 kg)    Diabetic Foot Exam - Simple   No data filed    Lab Results  Component Value Date   WBC 9.1 04/10/2022   HGB 14.2 04/10/2022   HCT 44.6 04/10/2022   PLT 370 04/10/2022   GLUCOSE 110 (H) 04/10/2022   CHOL 251 (H) 07/31/2021   TRIG 147.0 07/31/2021   HDL 56.40 07/31/2021   LDLCALC 166 (H) 07/31/2021   ALT 11 07/31/2021   AST 21 07/31/2021   NA 141 04/10/2022   K 4.1  04/10/2022   CL 108 04/10/2022   CREATININE 0.45 04/10/2022   BUN 11 04/10/2022   CO2 25 04/10/2022   TSH 2.60 07/31/2021   INR 1.03 04/19/2016   HGBA1C 5.1 09/04/2020    Lab Results  Component Value Date   TSH 2.60 07/31/2021   Lab Results  Component Value Date   WBC 9.1 04/10/2022   HGB 14.2 04/10/2022   HCT 44.6 04/10/2022   MCV 90.8 04/10/2022   PLT 370 04/10/2022   Lab Results  Component Value Date   NA 141 04/10/2022   K 4.1 04/10/2022   CHLORIDE 105 06/03/2016   CO2 25 04/10/2022   GLUCOSE 110 (H) 04/10/2022   BUN 11 04/10/2022   CREATININE 0.45 04/10/2022   BILITOT 0.5 07/31/2021   ALKPHOS 91 07/31/2021   AST 21 07/31/2021   ALT 11 07/31/2021   PROT 8.3 07/31/2021   ALBUMIN 4.7 07/31/2021   CALCIUM 9.7 04/10/2022   ANIONGAP 8 04/10/2022   EGFR >90 06/03/2016   GFR 87.10 07/31/2021   Lab Results  Component Value Date   CHOL 251 (H) 07/31/2021   Lab Results  Component Value Date   HDL 56.40 07/31/2021   Lab Results  Component Value Date   LDLCALC 166 (H) 07/31/2021   Lab Results  Component Value Date   TRIG 147.0 07/31/2021   Lab Results  Component Value Date   CHOLHDL 4 07/31/2021   Lab Results  Component Value Date   HGBA1C 5.1 09/04/2020       Assessment & Plan:   Problem List Items Addressed This Visit     Hyperglycemia    hgba1c acceptable, minimize simple carbs. Increase exercise as tolerated. Continue current meds      Relevant Orders   CBC with Differential/Platelet   Arthralgia    Struggles with daily pain but gets some relief from her current pain regimen. Is able to manage her ADLs so will allow refills today      Hyperlipidemia LDL goal <100    Tolerating statin, encouraged heart healthy diet, avoid trans fats, minimize simple carbs and saturated fats. Increase exercise as  tolerated      Relevant Orders   TSH   Lipid panel   Vitamin D deficiency    Supplement and monitor      Relevant Orders   VITAMIN D 25  Hydroxy (Vit-D Deficiency, Fractures)   Status post total right knee replacement    Underwent replacement last month and is doing well. Improving slowly. She still struggles with bilateral knee pain, hip pain and low back pain when she completes her ADLs, her pain meds are still helpful. Continue prn use as she increases her activity level      RESOLVED: Hyperlipidemia    Encourage heart healthy diet such as MIND or DASH diet, increase exercise, avoid trans fats, simple carbohydrates and processed foods, consider a krill or fish or flaxseed oil cap daily.       Relevant Orders   Hemoglobin A1c   Comprehensive metabolic panel    I have changed Janiyla Pyeatt's HYDROcodone-acetaminophen. I am also having her maintain her Multivitamin Adults 50+, fluticasone, conjugated estrogens, cyclobenzaprine, and rosuvastatin.  Meds ordered this encounter  Medications   HYDROcodone-acetaminophen (NORCO) 7.5-325 MG tablet    Sig: Take 1 tablet by mouth 3 (three) times daily as needed for severe pain.    Dispense:  90 tablet    Refill:  0     I discussed the assessment and treatment plan with the patient. The patient was provided an opportunity to ask questions and all were answered. The patient agreed with the plan and demonstrated an understanding of the instructions.   The patient was advised to call back or seek an in-person evaluation if the symptoms worsen or if the condition fails to improve as anticipated.  I provided 22 minutes of non-face-to-face time during this encounter.   Penni Homans, MD [facility] at [facility] [phone] (phone) [phone] (fax)  [place]

## 2022-06-07 NOTE — Assessment & Plan Note (Signed)
Encourage heart healthy diet such as MIND or DASH diet, increase exercise, avoid trans fats, simple carbohydrates and processed foods, consider a krill or fish or flaxseed oil cap daily.  °

## 2022-06-07 NOTE — Assessment & Plan Note (Signed)
hgba1c acceptable, minimize simple carbs. Increase exercise as tolerated. Continue current meds 

## 2022-06-07 NOTE — Assessment & Plan Note (Signed)
Supplement and monitor 

## 2022-06-07 NOTE — Assessment & Plan Note (Signed)
Tolerating statin, encouraged heart healthy diet, avoid trans fats, minimize simple carbs and saturated fats. Increase exercise as tolerated 

## 2022-06-07 NOTE — Telephone Encounter (Signed)
Lab appt was made and  labs order

## 2022-06-07 NOTE — Assessment & Plan Note (Signed)
Struggles with daily pain but gets some relief from her current pain regimen. Is able to manage her ADLs so will allow refills today

## 2022-06-07 NOTE — Assessment & Plan Note (Signed)
Underwent replacement last month and is doing well. Improving slowly. She still struggles with bilateral knee pain, hip pain and low back pain when she completes her ADLs, her pain meds are still helpful. Continue prn use as she increases her activity level

## 2022-06-12 ENCOUNTER — Other Ambulatory Visit (INDEPENDENT_AMBULATORY_CARE_PROVIDER_SITE_OTHER): Payer: Medicare Other

## 2022-06-12 DIAGNOSIS — R739 Hyperglycemia, unspecified: Secondary | ICD-10-CM

## 2022-06-12 DIAGNOSIS — E559 Vitamin D deficiency, unspecified: Secondary | ICD-10-CM

## 2022-06-12 DIAGNOSIS — E785 Hyperlipidemia, unspecified: Secondary | ICD-10-CM

## 2022-06-12 LAB — COMPREHENSIVE METABOLIC PANEL
ALT: 9 U/L (ref 0–35)
AST: 22 U/L (ref 0–37)
Albumin: 4.5 g/dL (ref 3.5–5.2)
Alkaline Phosphatase: 90 U/L (ref 39–117)
BUN: 8 mg/dL (ref 6–23)
CO2: 26 mEq/L (ref 19–32)
Calcium: 9.5 mg/dL (ref 8.4–10.5)
Chloride: 106 mEq/L (ref 96–112)
Creatinine, Ser: 0.56 mg/dL (ref 0.40–1.20)
GFR: 90.07 mL/min (ref >60.00)
Glucose, Bld: 95 mg/dL (ref 70–99)
Potassium: 4 mEq/L (ref 3.5–5.1)
Sodium: 140 mEq/L (ref 135–145)
Total Bilirubin: 0.5 mg/dL (ref 0.2–1.2)
Total Protein: 7.8 g/dL (ref 6.0–8.3)

## 2022-06-12 LAB — CBC WITH DIFFERENTIAL/PLATELET
Basophils Absolute: 0 10*3/uL (ref 0.0–0.1)
Basophils Relative: 0.6 % (ref 0.0–3.0)
Eosinophils Absolute: 0.1 10*3/uL (ref 0.0–0.7)
Eosinophils Relative: 1.3 % (ref 0.0–5.0)
HCT: 40 % (ref 36.0–46.0)
Hemoglobin: 13.2 g/dL (ref 12.0–15.0)
Lymphocytes Relative: 26.7 % (ref 12.0–46.0)
Lymphs Abs: 2.1 10*3/uL (ref 0.7–4.0)
MCHC: 32.9 g/dL (ref 30.0–36.0)
MCV: 89 fl (ref 78.0–100.0)
Monocytes Absolute: 0.6 10*3/uL (ref 0.1–1.0)
Monocytes Relative: 7.9 % (ref 3.0–12.0)
Neutro Abs: 5 10*3/uL (ref 1.4–7.7)
Neutrophils Relative %: 63.5 % (ref 43.0–77.0)
Platelets: 376 10*3/uL (ref 150.0–400.0)
RBC: 4.5 Mil/uL (ref 3.87–5.11)
RDW: 13.1 % (ref 11.5–15.5)
WBC: 7.8 10*3/uL (ref 4.0–10.5)

## 2022-06-12 LAB — LIPID PANEL
Cholesterol: 141 mg/dL (ref 0–200)
HDL: 49.9 mg/dL (ref >39.00)
LDL Cholesterol: 73 mg/dL (ref 0–99)
NonHDL: 90.74
Total CHOL/HDL Ratio: 3
Triglycerides: 88 mg/dL (ref 0.0–149.0)
VLDL: 17.6 mg/dL (ref 0.0–40.0)

## 2022-06-12 LAB — HEMOGLOBIN A1C: Hgb A1c MFr Bld: 5.7 % (ref 4.6–6.5)

## 2022-06-12 LAB — VITAMIN D 25 HYDROXY (VIT D DEFICIENCY, FRACTURES): VITD: 29.43 ng/mL — ABNORMAL LOW (ref 30.00–100.00)

## 2022-06-12 LAB — TSH: TSH: 3.2 u[IU]/mL (ref 0.35–5.50)

## 2022-07-04 ENCOUNTER — Telehealth: Payer: Self-pay | Admitting: Family Medicine

## 2022-07-04 ENCOUNTER — Other Ambulatory Visit: Payer: Self-pay | Admitting: Family Medicine

## 2022-07-04 MED ORDER — HYDROCODONE-ACETAMINOPHEN 7.5-325 MG PO TABS: 1.0000 | ORAL_TABLET | Freq: Three times a day (TID) | ORAL | 0 refills | Status: DC | PRN

## 2022-07-04 NOTE — Telephone Encounter (Signed)
Last Hydrocodone-acetaminophen 7.5 / '325mg'$  RX: 06/07/22, #90. Last OV: 06/07/22 Next OV: 08/20/22 UDS: 07/31/21; low risk repeat in 6 months. Pt past due. CSC: 09/04/21 CSR:

## 2022-07-04 NOTE — Telephone Encounter (Signed)
Medication:   HYDROcodone-acetaminophen (NORCO) 7.5-325 MG tablet [[id number]]   Has the patient contacted their pharmacy? No. (If no, request that the patient contact the pharmacy for the refill.) (If yes, when and what did the pharmacy advise?)  Preferred Pharmacy (with phone number or street name):   [id number] - [address], [place] [address]  Phone:  [phone]  Fax:  [phone]   Agent: Please be advised that RX refills may take up to 3 business days. We ask that you follow-up with your pharmacy.

## 2022-07-05 ENCOUNTER — Other Ambulatory Visit: Payer: Self-pay | Admitting: Medical

## 2022-08-06 ENCOUNTER — Other Ambulatory Visit: Payer: Self-pay | Admitting: Family Medicine

## 2022-08-06 ENCOUNTER — Telehealth: Payer: Self-pay | Admitting: Family Medicine

## 2022-08-06 MED ORDER — HYDROCODONE-ACETAMINOPHEN 7.5-325 MG PO TABS: 1.0000 | ORAL_TABLET | Freq: Three times a day (TID) | ORAL | 0 refills | Status: DC | PRN

## 2022-08-06 NOTE — Telephone Encounter (Signed)
Medication: HYDROcodone-acetaminophen (NORCO) 7.5-325 MG tablet   Has the patient contacted their pharmacy? No.   Preferred Pharmacy (with phone number or street name):  [facility] [id number] - [address], [place] [address] Phone: [phone]  Fax: [phone] DEA #: --

## 2022-08-20 ENCOUNTER — Telehealth (INDEPENDENT_AMBULATORY_CARE_PROVIDER_SITE_OTHER): Payer: Medicare Other | Admitting: Family Medicine

## 2022-08-20 ENCOUNTER — Other Ambulatory Visit: Payer: Self-pay | Admitting: Family Medicine

## 2022-08-20 ENCOUNTER — Telehealth: Payer: Self-pay

## 2022-08-20 DIAGNOSIS — E559 Vitamin D deficiency, unspecified: Secondary | ICD-10-CM | POA: Diagnosis not present

## 2022-08-20 DIAGNOSIS — R739 Hyperglycemia, unspecified: Secondary | ICD-10-CM

## 2022-08-20 DIAGNOSIS — Z86010 Personal history of colonic polyps: Secondary | ICD-10-CM

## 2022-08-20 DIAGNOSIS — M25551 Pain in right hip: Secondary | ICD-10-CM | POA: Diagnosis not present

## 2022-08-20 MED ORDER — CYCLOBENZAPRINE HCL 5 MG PO TABS: 2.5000 mg | ORAL_TABLET | Freq: Two times a day (BID) | ORAL | 3 refills | Status: DC | PRN

## 2022-08-20 MED ORDER — CYCLOBENZAPRINE HCL 5 MG PO TABS: 2.5000 mg | ORAL_TABLET | Freq: Every evening | ORAL | 3 refills | Status: DC | PRN

## 2022-08-20 NOTE — Patient Instructions (Signed)

## 2022-08-20 NOTE — Assessment & Plan Note (Signed)
Supplement and monitor 

## 2022-08-20 NOTE — Telephone Encounter (Signed)
Melanie Lee called and stated that the sig on the Flexeril is conflicting. We can either resend it, or call with instructions.

## 2022-08-20 NOTE — Progress Notes (Signed)
MyChart Video Visit Virtual Visit via Video Note   This visit type was conducted due to national recommendations for restrictions regarding the COVID-19 Pandemic (e.g. social distancing) in an effort to limit this patient's exposure and mitigate transmission in our community. This patient is at least at moderate risk for complications without adequate follow up. This format is felt to be most appropriate for this patient at this time. Physical exam was limited by quality of the video and audio technology used for the visit. [name], CMA, was able to get the patient set up on a video visit.  Patient location: Patient at home and provider in visit Provider location: Office  I discussed the limitations of evaluation and management by telemedicine and the availability of in person appointments. The patient expressed understanding and agreed to proceed.  Visit Date: 08/20/2022  Today's healthcare provider: Penni Homans, MD     Subjective:    Patient ID: Melanie Lee, female    DOB: 09/25/1946, 76 y.o.   MRN: [id number]  Chief Complaint  Patient presents with   Follow-up    Follow up   HPI Patient is in today for a video visit.   Hyperlipidemia: She takes Rosuvastatin 10 mg daily which has been effective at managing her hyperlipidemia.  Lab Results  Component Value Date   CHOL 141 06/12/2022   HDL 49.90 06/12/2022   LDLCALC 73 06/12/2022   TRIG 88.0 06/12/2022   CHOLHDL 3 06/12/2022   Immunizations: She is up to date on Flu immunizations and has been informed about receiving Covid-19 and RSV immunizations.   Right Hip Pain: She complains of right hip pain that exacerbates with walking. She takes Cyclobenzaprine 5 mg and Hydrocodone 7.5-325 mg to manage the pain. She has an appointment next month with her orthopedic surgeon, Dr. Alvan Dame. She states that she experiences flatulence as well as pain in her back and right side. She denies abnormalities or changes in her bowel movements.    Refills: She is requesting a refill on Cyclobenzaprine 5 mg.  Supplements: She takes vitamin D3 1000 IU daily.  Urology: She reports that there is a slight red coloration in her urine and she will follow up on this with her urologist.  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   S/P excision of bony prominence, right knee 04/23/2022   Past Surgical History:  Procedure Laterality Date   ABDOMINAL HYSTERECTOMY  1995   total for fibroids   BONE EXCISION Right 04/23/2022   Procedure: Excision of bony prominence Right total knee;  Surgeon: Paralee Cancel, MD;  Location: [facility];  Service: Orthopedics;  Laterality: Right;   BREAST BIOPSY  1995   benign   CATARACT EXTRACTION  2013 & 2015   Bilateral   KNEE SURGERY     Left Arthroscopy   right hip replacement      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/2016   Procedure: TOTAL KNEE ARTHROPLASTY;  Surgeon: Latanya Maudlin, MD;  Location: [facility];  Service: Orthopedics;  Laterality: Left;   TOTAL KNEE ARTHROPLASTY Right 12/26/2020   Procedure: TOTAL KNEE ARTHROPLASTY;  Surgeon: Paralee Cancel, MD;  Location: [facility];  Service: Orthopedics;  Laterality: Right;  70 mins   Family History  Problem Relation Age of Onset   Breast cancer Mother 83  Deceased   Heart disease Mother    Hypertension Mother    Hypertension Father        Deceased   Stroke Father    Hypertension Sister    Hypertension Brother    Heart defect Daughter    Hyperlipidemia Daughter    Hypertension Daughter    Vision loss Paternal Uncle    Stroke Brother    Social History   Socioeconomic History   Marital status: Single    Spouse name: Not on file   Number of children: Not on file   Years of education: Not on file   Highest education level: Not on file  Occupational History   Not on file   Tobacco Use   Smoking status: Never   Smokeless tobacco: Never  Vaping Use   Vaping Use: Never used  Substance and Sexual Activity   Alcohol use: Never   Drug use: No   Sexual activity: Not Currently  Other Topics Concern   Not on file  Social History Narrative   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      Right handed   Social Determinants of Health   Financial Resource Strain: Medium Risk (01/29/2022)   Overall Financial Resource Strain (CARDIA)    Difficulty of Paying Living Expenses: Somewhat hard  Food Insecurity: No Food Insecurity (01/29/2022)   Hunger Vital Sign    Worried About Running Out of Food in the Last Year: Never true    Ran Out of Food in the Last Year: Never true  Transportation Needs: No Transportation Needs (01/29/2022)   PRAPARE - Hydrologist (Medical): No    Lack of Transportation (Non-Medical): No  Physical Activity: Inactive (01/29/2022)   Exercise Vital Sign    Days of Exercise per Week: 0 days    Minutes of Exercise per Session: 0 min  Stress: Stress Concern Present (01/29/2022)   [place]    Feeling of Stress : To some extent  Social Connections: Moderately Isolated (01/29/2022)   Social Connection and Isolation Panel [NHANES]    Frequency of Communication with Friends and Family: More than three times a week    Frequency of Social Gatherings with Friends and Family: Three times a week    Attends Religious Services: More than 4 times per year    Active Member of Clubs or Organizations: No    Attends Archivist Meetings: Never    Marital Status: Never married  Intimate Partner Violence: Not At Risk (01/29/2022)   Humiliation, Afraid, Rape, and Kick questionnaire    Fear of Current or Ex-Partner: No    Emotionally Abused: No    Physically Abused: No    Sexually Abused: No   Outpatient Medications Prior to Visit  Medication Sig Dispense Refill    conjugated estrogens (PREMARIN) vaginal cream Place 1 Applicatorful vaginally every Monday, Wednesday, and Friday.     fluticasone (FLONASE) 50 MCG/ACT nasal spray Place 2 sprays into both nostrils daily. (Patient taking differently: Place 2 sprays into both nostrils daily as needed for allergies.) 16 g 1   HYDROcodone-acetaminophen (NORCO) 7.5-325 MG tablet Take 1 tablet by mouth 3 (three) times daily as needed for severe pain. 90 tablet 0   Multiple Vitamins-Minerals (MULTIVITAMIN ADULTS 50+) TABS Take 1 tablet by mouth daily.      rosuvastatin (CRESTOR) 10 MG tablet TAKE ONE TABLET BY MOUTH DAILY 90 tablet 1   cyclobenzaprine (FLEXERIL) 5 MG tablet Take  0.5-1 tablets (2.5-5 mg total) by mouth at bedtime as needed for muscle spasms. 1 tab po q day prn headache 30 tablet 3   No facility-administered medications prior to visit.   Allergies  Allergen Reactions   Tramadol Nausea Only and Other (See Comments)    Dizziness   Review of Systems  Constitutional:  Negative for fever and malaise/fatigue.  HENT:  Negative for congestion.   Eyes:  Negative for blurred vision.  Respiratory:  Negative for shortness of breath.   Cardiovascular:  Negative for chest pain, palpitations and leg swelling.  Gastrointestinal:  Negative for abdominal pain, blood in stool and nausea.       (+) flatulence.  Genitourinary:  Negative for dysuria and frequency.  Musculoskeletal:  Positive for back pain and joint pain. Negative for falls.       (+) right hip pain.  Skin:  Negative for rash.  Neurological:  Negative for dizziness, loss of consciousness and headaches.  Endo/Heme/Allergies:  Negative for environmental allergies.  Psychiatric/Behavioral:  Negative for depression. The patient is not nervous/anxious.       Objective:    Physical Exam Constitutional:      General: She is not in acute distress.    Appearance: Normal appearance. She is not ill-appearing or toxic-appearing.  HENT:     Head:  Normocephalic and atraumatic.     Right Ear: External ear normal.     Left Ear: External ear normal.     Nose: Nose normal.  Eyes:     General:        Right eye: No discharge.        Left eye: No discharge.  Pulmonary:     Effort: Pulmonary effort is normal.  Skin:    Findings: No rash.  Neurological:     Mental Status: She is alert and oriented to person, place, and time.  Psychiatric:        Behavior: Behavior normal.     There were no vitals taken for this visit. Wt Readings from Last 3 Encounters:  04/23/22 151 lb (68.5 kg)  04/10/22 151 lb (68.5 kg)  01/10/22 151 lb (68.5 kg)   Diabetic Foot Exam - Simple   No data filed    Lab Results  Component Value Date   WBC 7.8 06/12/2022   HGB 13.2 06/12/2022   HCT 40.0 06/12/2022   PLT 376.0 06/12/2022   GLUCOSE 95 06/12/2022   CHOL 141 06/12/2022   TRIG 88.0 06/12/2022   HDL 49.90 06/12/2022   LDLCALC 73 06/12/2022   ALT 9 06/12/2022   AST 22 06/12/2022   NA 140 06/12/2022   K 4.0 06/12/2022   CL 106 06/12/2022   CREATININE 0.56 06/12/2022   BUN 8 06/12/2022   CO2 26 06/12/2022   TSH 3.20 06/12/2022   INR 1.03 04/19/2016   HGBA1C 5.7 06/12/2022   Lab Results  Component Value Date   TSH 3.20 06/12/2022   Lab Results  Component Value Date   WBC 7.8 06/12/2022   HGB 13.2 06/12/2022   HCT 40.0 06/12/2022   MCV 89.0 06/12/2022   PLT 376.0 06/12/2022   Lab Results  Component Value Date   NA 140 06/12/2022   K 4.0 06/12/2022   CHLORIDE 105 06/03/2016   CO2 26 06/12/2022   GLUCOSE 95 06/12/2022   BUN 8 06/12/2022   CREATININE 0.56 06/12/2022   BILITOT 0.5 06/12/2022   ALKPHOS 90 06/12/2022   AST 22 06/12/2022   ALT 9  06/12/2022   PROT 7.8 06/12/2022   ALBUMIN 4.5 06/12/2022   CALCIUM 9.5 06/12/2022   ANIONGAP 8 04/10/2022   EGFR >90 06/03/2016   GFR 90.07 06/12/2022   Lab Results  Component Value Date   CHOL 141 06/12/2022   Lab Results  Component Value Date   HDL 49.90 06/12/2022   Lab  Results  Component Value Date   LDLCALC 73 06/12/2022   Lab Results  Component Value Date   TRIG 88.0 06/12/2022   Lab Results  Component Value Date   CHOLHDL 3 06/12/2022   Lab Results  Component Value Date   HGBA1C 5.7 06/12/2022      Assessment & Plan:   Problem List Items Addressed This Visit     Hyperglycemia    hgba1c acceptable, minimize simple carbs. Increase exercise as tolerated.        Vitamin D deficiency - Primary    Supplement and monitor       Hx of colonic polyp    Is referred to gastroenterology for further evaluation      Relevant Orders   Ambulatory referral to Gastroenterology   Right hip pain    Encouraged moist heat and gentle stretching as tolerated. May try NSAIDs and prescription meds as directed and report if symptoms worsen or seek immediate care       Meds ordered this encounter  Medications   DISCONTD: cyclobenzaprine (FLEXERIL) 5 MG tablet    Sig: Take 0.5-1 tablets (2.5-5 mg total) by mouth at bedtime as needed for muscle spasms. 1 tab po q day prn headache    Dispense:  30 tablet    Refill:  3   I discussed the assessment and treatment plan with the patient. The patient was provided an opportunity to ask questions and all were answered. The patient agreed with the plan and demonstrated an understanding of the instructions.   The patient was advised to call back or seek an in-person evaluation if the symptoms worsen or if the condition fails to improve as anticipated.  I provided 20  minutes of face-to-face time during this encounter.  I,Mohammed Iqbal,acting as a scribe for Penni Homans, MD.,have documented all relevant documentation on the behalf of Penni Homans, MD,as directed by  Penni Homans, MD while in the presence of Penni Homans, MD.  Penni Homans, MD [facility] at [facility] [phone] (phone) [phone] (fax)  [place]

## 2022-08-20 NOTE — Assessment & Plan Note (Signed)
hgba1c acceptable, minimize simple carbs. Increase exercise as tolerated.  

## 2022-08-21 DIAGNOSIS — M25551 Pain in right hip: Secondary | ICD-10-CM | POA: Insufficient documentation

## 2022-08-21 DIAGNOSIS — Z86010 Personal history of colonic polyps: Secondary | ICD-10-CM | POA: Insufficient documentation

## 2022-08-21 NOTE — Assessment & Plan Note (Signed)
Is referred to gastroenterology for further evaluation

## 2022-08-21 NOTE — Assessment & Plan Note (Signed)
Encouraged moist heat and gentle stretching as tolerated. May try NSAIDs and prescription meds as directed and report if symptoms worsen or seek immediate care 

## 2022-08-30 ENCOUNTER — Telehealth: Payer: Self-pay | Admitting: Gastroenterology

## 2022-08-30 NOTE — Telephone Encounter (Signed)
Good Morning Dr.Cirigliano,  Supervising MD 11/17 AM  We received a referral on this patient to be seen for abdominal pain and history of colon polyps. The patient had a colonoscopy in 2017 and it was recommend she repeat in 3-5 years. Patient is requesting this transfer as her pcp referred her to our office and she would like to stay with the providing office she requested.  Records are available for review in Epic, [place]. Please advise.

## 2022-09-02 ENCOUNTER — Other Ambulatory Visit: Payer: Self-pay | Admitting: Family Medicine

## 2022-09-02 ENCOUNTER — Telehealth: Payer: Self-pay | Admitting: Family Medicine

## 2022-09-02 MED ORDER — HYDROCODONE-ACETAMINOPHEN 7.5-325 MG PO TABS: 1.0000 | ORAL_TABLET | Freq: Three times a day (TID) | ORAL | 0 refills | Status: DC | PRN

## 2022-09-02 NOTE — Telephone Encounter (Signed)
Medication: HYDROcodone-acetaminophen (NORCO) 7.5-325 MG tablet [   Has the patient contacted their pharmacy? No.   Preferred Pharmacy (with phone number or street name):  [facility] [id number] - [address], [place] [address] Phone: [phone]  Fax: [phone]

## 2022-09-10 NOTE — Telephone Encounter (Signed)
Previous records reviewed.  Previously seen by GI at [facility] to include colonoscopy in 08/2019 notable for tubular adenoma with recommendation to repeat in 5 years (only phone note available for review and no endoscopy report or pathology report).  Prior to that, colonoscopy in 2017 with 3 mm polyp removed from the appendiceal orifice, 3 mm pedunculated IC valve polyp, 2 small 3 mm ascending colon polyps, left-sided diverticulosis, internal hemorrhoids with recommendation to repeat in 3-5 years (no pathology report available for review).  Can you please try to obtain the last colonoscopy report from [facility] from 08/2019 along with the pathology report that goes along with it.  Based on the limited phone note, she would not be due for repeat colonoscopy until 2025.  If the only reason for the referral is for continued polyp surveillance, was otherwise hold to the recommendations of her previous Endoscopist.

## 2022-09-12 NOTE — Telephone Encounter (Signed)
I have requesting those records to be sent.

## 2022-09-30 ENCOUNTER — Other Ambulatory Visit: Payer: Self-pay | Admitting: Family Medicine

## 2022-09-30 ENCOUNTER — Telehealth: Payer: Self-pay | Admitting: Family Medicine

## 2022-09-30 MED ORDER — HYDROCODONE-ACETAMINOPHEN 7.5-325 MG PO TABS
1.0000 | ORAL_TABLET | Freq: Three times a day (TID) | ORAL | 0 refills | Status: DC | PRN
Start: 2022-09-30 — End: 2022-10-30

## 2022-09-30 NOTE — Telephone Encounter (Signed)
Requesting: Norco Contract: 09/04/2021 UDS: 07/31/21 Last Visit: 08/20/22 Next Visit: Visit date not found Last Refill: 09/02/22, #90 x 0 RF  Please Advise

## 2022-09-30 NOTE — Telephone Encounter (Signed)
Medication:HYDROcodone-acetaminophen ([place]) 7.5-325 MG tablet   Has the patient contacted their pharmacy? No.   Preferred Pharmacy (with phone number or street name):  [facility] [id number] - [address], [place] [address] Phone: [phone]  Fax: [phone]

## 2022-10-04 DIAGNOSIS — H02413 Mechanical ptosis of bilateral eyelids: Secondary | ICD-10-CM | POA: Diagnosis not present

## 2022-10-04 DIAGNOSIS — H26493 Other secondary cataract, bilateral: Secondary | ICD-10-CM | POA: Diagnosis not present

## 2022-10-04 DIAGNOSIS — H524 Presbyopia: Secondary | ICD-10-CM | POA: Diagnosis not present

## 2022-10-18 DIAGNOSIS — M25551 Pain in right hip: Secondary | ICD-10-CM | POA: Diagnosis not present

## 2022-10-18 DIAGNOSIS — M7061 Trochanteric bursitis, right hip: Secondary | ICD-10-CM | POA: Diagnosis not present

## 2022-10-29 ENCOUNTER — Telehealth: Payer: Self-pay | Admitting: Family Medicine

## 2022-10-29 NOTE — Telephone Encounter (Signed)
Prescription Request  10/29/2022  Is this a "Controlled Substance" medicine? Yes  LOV: Visit date not found  What is the name of the medication or equipment?  HYDROcodone-acetaminophen (NORCO) 7.5-325 MG tablet [[id number]]  Have you contacted your pharmacy to request a refill? No   Which pharmacy would you like this sent to?  [facility] [id number] - [place] [address] Phone: [phone] Fax: [phone]    Patient notified that their request is being sent to the clinical staff for review and that they should receive a response within 2 business days.   Please advise at Mobile [phone] (mobile)

## 2022-10-30 ENCOUNTER — Other Ambulatory Visit: Payer: Self-pay | Admitting: Family Medicine

## 2022-10-30 MED ORDER — HYDROCODONE-ACETAMINOPHEN 7.5-325 MG PO TABS: 1.0000 | ORAL_TABLET | Freq: Three times a day (TID) | ORAL | 0 refills | Status: DC | PRN

## 2022-11-26 ENCOUNTER — Telehealth: Payer: Self-pay | Admitting: Family Medicine

## 2022-11-26 NOTE — Telephone Encounter (Signed)
Prescription Request  11/26/2022  Is this a "Controlled Substance" medicine? Yes  LOV: Visit date not found  What is the name of the medication or equipment?  HYDROcodone-acetaminophen (NORCO) 7.5-325 MG tablet   Have you contacted your pharmacy to request a refill? No   Which pharmacy would you like this sent to?  [facility] [id number] - [place] [address] Phone: [phone] Fax: [phone]    Patient notified that their request is being sent to the clinical staff for review and that they should receive a response within 2 business days.   Please advise at Mobile [phone] (mobile)

## 2022-11-27 ENCOUNTER — Other Ambulatory Visit: Payer: Self-pay | Admitting: Family Medicine

## 2022-11-27 MED ORDER — HYDROCODONE-ACETAMINOPHEN 7.5-325 MG PO TABS: 1.0000 | ORAL_TABLET | Freq: Three times a day (TID) | ORAL | 0 refills | Status: DC | PRN

## 2022-12-09 DIAGNOSIS — Z1231 Encounter for screening mammogram for malignant neoplasm of breast: Secondary | ICD-10-CM | POA: Diagnosis not present

## 2022-12-09 LAB — HM MAMMOGRAPHY

## 2022-12-11 ENCOUNTER — Encounter: Payer: Self-pay | Admitting: Family Medicine

## 2022-12-20 ENCOUNTER — Telehealth: Payer: Self-pay | Admitting: Family Medicine

## 2022-12-20 NOTE — Telephone Encounter (Signed)
Pt would like to know if she is due for a coloscopy.

## 2022-12-20 NOTE — Telephone Encounter (Signed)
Called pt back was advised not due till 2027 And Pt made f/u visit for 02/18/23.

## 2022-12-27 ENCOUNTER — Telehealth: Payer: Self-pay | Admitting: Family Medicine

## 2022-12-27 ENCOUNTER — Encounter: Payer: Self-pay | Admitting: *Deleted

## 2022-12-27 ENCOUNTER — Other Ambulatory Visit: Payer: Self-pay | Admitting: Family Medicine

## 2022-12-27 MED ORDER — HYDROCODONE-ACETAMINOPHEN 7.5-325 MG PO TABS
1.0000 | ORAL_TABLET | Freq: Three times a day (TID) | ORAL | 0 refills | Status: DC | PRN
Start: 2022-12-27 — End: 2023-01-22

## 2022-12-27 NOTE — Telephone Encounter (Signed)
Requesting: hydrocodone/apap 7.5/325 Contract: No UDS: 07/31/21 Last Visit: 08/20/2022 Next Visit: 02/18/2023 Last Refill: 11/27/22  Please Advise

## 2022-12-27 NOTE — Telephone Encounter (Signed)
Prescription Request  12/27/2022  Is this a "Controlled Substance" medicine? Yes  LOV: Visit date not found  What is the name of the medication or equipment?  HYDROcodone-acetaminophen (NORCO) 7.5-325 MG tablet   Have you contacted your pharmacy to request a refill? No   Which pharmacy would you like this sent to?  [facility] [id number] - [place] [address] Phone: [phone] Fax: [phone]    Patient notified that their request is being sent to the clinical staff for review and that they should receive a response within 2 business days.   Please advise at Mobile [phone] (mobile)

## 2022-12-30 ENCOUNTER — Other Ambulatory Visit: Payer: Self-pay | Admitting: Medical

## 2023-01-06 ENCOUNTER — Telehealth: Payer: Self-pay | Admitting: Family Medicine

## 2023-01-06 NOTE — Telephone Encounter (Signed)
Called pt was advised Dr.Blyth don't prescription for aspirin. Ask pt has she been seen by another provider and stated yes seen Orthopedics. Pt was advised to call them.

## 2023-01-06 NOTE — Telephone Encounter (Signed)
Pt said when she went to the pharmacy to get her medication, there was a prescription for aspirin as well but she does not remember Dr. Charlett Blake mentioning she needed to take any aspirin so she would like to know why. Please call to advise.

## 2023-01-13 ENCOUNTER — Telehealth: Payer: Self-pay | Admitting: Family Medicine

## 2023-01-13 ENCOUNTER — Other Ambulatory Visit: Payer: Self-pay

## 2023-01-13 MED ORDER — CYCLOBENZAPRINE HCL 5 MG PO TABS: 2.5000 mg | ORAL_TABLET | Freq: Two times a day (BID) | ORAL | 3 refills | Status: DC | PRN

## 2023-01-13 NOTE — Telephone Encounter (Signed)
Medication sent.

## 2023-01-13 NOTE — Telephone Encounter (Signed)
Prescription Request  01/13/2023  Is this a "Controlled Substance" medicine? No  LOV: Visit date not found  What is the name of the medication or equipment? cyclobenzaprine (FLEXERIL) 5 MG tablet   Have you contacted your pharmacy to request a refill? No   Which pharmacy would you like this sent to?  [facility] [id number] - [place] [address] Phone: [phone] Fax: [phone]    Patient notified that their request is being sent to the clinical staff for review and that they should receive a response within 2 business days.   Please advise at [place]

## 2023-01-22 ENCOUNTER — Telehealth: Payer: Self-pay | Admitting: Family Medicine

## 2023-01-22 ENCOUNTER — Other Ambulatory Visit: Payer: Self-pay | Admitting: Family Medicine

## 2023-01-22 MED ORDER — HYDROCODONE-ACETAMINOPHEN 7.5-325 MG PO TABS
1.0000 | ORAL_TABLET | Freq: Three times a day (TID) | ORAL | 0 refills | Status: DC | PRN
Start: 2023-01-22 — End: 2023-02-18

## 2023-01-22 NOTE — Telephone Encounter (Signed)
Medication: HYDROcodone-acetaminophen (NORCO) 7.5-325 MG tablet  Has the patient contacted their pharmacy? No.   Preferred Pharmacy:   [facility] [id number] - [place] - [address], [place] [address] Phone: [phone]  Fax: [phone]

## 2023-01-22 NOTE — Telephone Encounter (Signed)
Contacted Melanie Lee to schedule their annual wellness visit. Appointment made for 01/31/2023.  [name]; Care Guide Ambulatory Clinical Support [place] [facility] Direct Dial: [phone]

## 2023-01-31 ENCOUNTER — Ambulatory Visit (INDEPENDENT_AMBULATORY_CARE_PROVIDER_SITE_OTHER): Payer: Medicare Other

## 2023-01-31 VITALS — Ht 63.0 in | Wt 145.0 lb

## 2023-01-31 DIAGNOSIS — Z0000 Encounter for general adult medical examination without abnormal findings: Secondary | ICD-10-CM

## 2023-01-31 NOTE — Progress Notes (Cosign Needed Addendum)
Subjective:   Melanie Lee is a 77 y.o. female who presents for Medicare Annual (Subsequent) preventive examination.   I connected with  [name] on 01/31/23 by a audio enabled telemedicine application and verified that I am speaking with the correct person using two identifiers.  Patient Location: Home  Provider Location: Home Office  I discussed the limitations of evaluation and management by telemedicine. The patient expressed understanding and agreed to proceed.         Review of Systems     Cardiac Risk Factors include: advanced age (>60men, >43 women);dyslipidemia;hypertension     Objective:    Today's Vitals   01/31/23 1425  Weight: 145 lb (65.8 kg)  Height: 5\' 3"  (1.6 m)   Body mass index is 25.69 kg/m.     01/31/2023    2:30 PM 04/10/2022   11:03 AM 09/18/2021   12:53 PM 03/06/2021    3:00 PM 12/26/2020   10:10 AM 12/26/2020    5:50 AM 12/13/2020   11:18 AM  Advanced Directives  Does Patient Have a Medical Advance Directive? No No No No No No No  Would patient like information on creating a medical advance directive? No - Patient declined   Yes (MAU/Ambulatory/Procedural Areas - Information given) No - Patient declined No - Patient declined     Current Medications (verified) Outpatient Encounter Medications as of 01/31/2023  Medication Sig   cyclobenzaprine (FLEXERIL) 5 MG tablet Take 0.5-1 tablets (2.5-5 mg total) by mouth 2 (two) times daily as needed for muscle spasms.   fluticasone (FLONASE) 50 MCG/ACT nasal spray Place 2 sprays into both nostrils daily. (Patient taking differently: Place 2 sprays into both nostrils daily as needed for allergies.)   HYDROcodone-acetaminophen (NORCO) 7.5-325 MG tablet Take 1 tablet by mouth 3 (three) times daily as needed for severe pain.   Multiple Vitamins-Minerals (MULTIVITAMIN ADULTS 50+) TABS Take 1 tablet by mouth daily.    rosuvastatin (CRESTOR) 10 MG tablet TAKE 1 TABLET BY MOUTH DAILY   conjugated  estrogens (PREMARIN) vaginal cream Place 1 Applicatorful vaginally every Monday, Wednesday, and Friday. (Patient not taking: Reported on 01/31/2023)   No facility-administered encounter medications on file as of 01/31/2023.    Allergies (verified) Tramadol   History: 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   S/P excision of bony prominence, right knee 04/23/2022   Past Surgical History:  Procedure Laterality Date   ABDOMINAL HYSTERECTOMY  1995   total for fibroids   BONE EXCISION Right 04/23/2022   Procedure: Excision of bony prominence Right total knee;  Surgeon: Durene Romans, MD;  Location: [facility];  Service: Orthopedics;  Laterality: Right;   BREAST BIOPSY  1995   benign   CATARACT EXTRACTION  2013 & 2015   Bilateral   KNEE SURGERY     Left Arthroscopy   right hip replacement      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/2016   Procedure: TOTAL KNEE ARTHROPLASTY;  Surgeon: Ranee Gosselin, MD;  Location: [facility];  Service: Orthopedics;  Laterality: Left;   TOTAL KNEE ARTHROPLASTY Right 12/26/2020   Procedure: TOTAL KNEE ARTHROPLASTY;  Surgeon: Durene Romans, MD;  Location: [facility];  Service: Orthopedics;  Laterality: Right;  70 mins   Family History  Problem Relation Age of Onset   Breast cancer  Mother 71       Deceased   Heart disease Mother    Hypertension Mother    Hypertension Father        Deceased   Stroke Father    Hypertension Sister    Hypertension Brother    Heart defect Daughter    Hyperlipidemia Daughter    Hypertension Daughter    Vision loss Paternal Uncle    Stroke Brother    Social History   Socioeconomic History   Marital status: Single    Spouse name: Not on file   Number of children: Not on file   Years of education: Not on file   Highest education level: Not on file   Occupational History   Not on file  Tobacco Use   Smoking status: Never   Smokeless tobacco: Never  Vaping Use   Vaping Use: Never used  Substance and Sexual Activity   Alcohol use: Never   Drug use: No   Sexual activity: Not Currently  Other Topics Concern   Not on file  Social History Narrative   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      Right handed   Social Determinants of Health   Financial Resource Strain: Low Risk  (01/31/2023)   Overall Financial Resource Strain (CARDIA)    Difficulty of Paying Living Expenses: Not hard at all  Food Insecurity: No Food Insecurity (01/31/2023)   Hunger Vital Sign    Worried About Running Out of Food in the Last Year: Never true    Ran Out of Food in the Last Year: Never true  Transportation Needs: No Transportation Needs (01/31/2023)   PRAPARE - Administrator, Civil Service (Medical): No    Lack of Transportation (Non-Medical): No  Physical Activity: Inactive (01/31/2023)   Exercise Vital Sign    Days of Exercise per Week: 0 days    Minutes of Exercise per Session: 0 min  Stress: No Stress Concern Present (01/31/2023)   [employer] of Occupational Health - Occupational Stress Questionnaire    Feeling of Stress : Not at all  Social Connections: Moderately Integrated (01/31/2023)   Social Connection and Isolation Panel [NHANES]    Frequency of Communication with Friends and Family: More than three times a week    Frequency of Social Gatherings with Friends and Family: Once a week    Attends Religious Services: More than 4 times per year    Active Member of [employer] or Organizations: No    Attends Engineer, structural: More than 4 times per year    Marital Status: Widowed    Tobacco Counseling Counseling given: Not Answered   Clinical Intake:  Pre-visit preparation completed: Yes  Pain : No/denies pain     BMI - recorded: 25.69 Nutritional Risks: None Diabetes: No  How often do you need to have someone  help you when you read instructions, pamphlets, or other written materials from your doctor or pharmacy?: 1 - Never  Diabetic? No  Interpreter Needed?: No  Information entered by :: Kandis Cocking, CMA   Activities of Daily Living    01/31/2023    2:33 PM 04/10/2022   11:04 AM  In your present state of health, do you have any difficulty performing the following activities:  Hearing? 0   Vision? 0   Difficulty concentrating or making decisions? 0   Walking or climbing stairs? 1   Comment have to take one step at a time   Dressing or bathing? 0  Doing errands, shopping? 0 0  Preparing Food and eating ? N   Using the Toilet? N   In the past six months, have you accidently leaked urine? Y   Do you have problems with loss of bowel control? N   Managing your Medications? N   Managing your Finances? N   Housekeeping or managing your Housekeeping? N     Patient Care Team: Bradd Canary, MD as PCP - General (Family Medicine) Sydnee Cabal., MD as Referring Physician (Gastroenterology)  Indicate any recent Medical Services you may have received from other than [facility] providers in the past year (date may be approximate).     Assessment:   This is a routine wellness examination for Tytiana.  Hearing/Vision screen Hearing Screening - Comments:: Denies hearing difficulties   Vision Screening - Comments:: Wears rx reading  glasses - up to date with routine eye exams Dr. Biagio Borg in [facility]  in Dec 2023  Dietary issues and exercise activities discussed: Current Exercise Habits: The patient does not participate in regular exercise at present, Exercise limited by: orthopedic condition(s) (no due to having left hip replacement)   Goals Addressed             This Visit's Progress    Patient Stated       Wants to get better, and exercise more       Depression Screen    01/31/2023    2:57 PM 01/29/2022    1:07 PM 08/21/2021    1:30 PM 10/20/2020   10:42 AM 02/07/2017    8:21  AM 01/01/2016    9:02 AM 10/28/2014    2:21 PM  PHQ 2/9 Scores  PHQ - 2 Score 0 2 0 0 0 0 0  PHQ- 9 Score  2  2       Fall Risk    01/31/2023    2:28 PM 01/29/2022    1:06 PM 09/18/2021   12:53 PM 08/21/2021    1:29 PM 03/06/2021    3:24 PM  Fall Risk   Falls in the past year? 0 0 0 1 0  Number falls in past yr: 0 0 0 0 0  Injury with Fall? 0 0 0 0 0  Risk for fall due to : No Fall Risks No Fall Risks  No Fall Risks   Follow up Falls prevention discussed   Falls evaluation completed     FALL RISK PREVENTION PERTAINING TO THE HOME:  Any stairs in or around the home? Yes  If so, are there any without handrails? No  Home free of loose throw rugs in walkways, pet beds, electrical cords, etc? No  Adequate lighting in your home to reduce risk of falls? Yes   ASSISTIVE DEVICES UTILIZED TO PREVENT FALLS:  Life alert? No  Use of a cane, walker or w/c?  No Grab bars in the bathroom? Yes  Shower chair or bench in shower? Yes  Elevated toilet seat or a handicapped toilet? Yes   TIMED UP AND GO:  Was the test performed? No . tTelevisit   Cognitive Function:        01/31/2023    2:35 PM 01/29/2022    1:12 PM  6CIT Screen  What Year? 0 points 0 points  What month? 0 points 0 points  What time? 0 points 0 points  Count back from 20 0 points 0 points  Months in reverse 0 points 4 points  Repeat phrase 10 points 10 points  Total Score 10 points 14 points    Immunizations Immunization History  Administered Date(s) Administered   Fluad Quad(high Dose 65+) 07/31/2021   Influenza, High Dose Seasonal PF 09/14/2018, 07/25/2020   Influenza,inj,Quad PF,6+ Mos 06/24/2016   Influenza,inj,quad, With Preservative 10/20/2018   Moderna Sars-Covid-2 Vaccination 11/22/2019, 12/22/2019, 10/16/2020, 04/03/2021   Pneumococcal Conjugate-13 10/28/2014   Pneumococcal Polysaccharide-23 01/01/2016   Tdap 04/01/2016   Zoster, Live 03/21/2015    TDAP status: Up to date  Flu Vaccine status: Up  to date  Pneumococcal vaccine status: Up to date  Covid-19 vaccine status: Information provided on how to obtain vaccines.   Qualifies for Shingles Vaccine? Yes   Zostavax completed Yes   Shingrix Completed?: No.    Education has been provided regarding the importance of this vaccine. Patient has been advised to call insurance company to determine out of pocket expense if they have not yet received this vaccine. Advised may also receive vaccine at local pharmacy or Health Dept. Verbalized acceptance and understanding.  Screening Tests Health Maintenance  Topic Date Due   Zoster Vaccines- Shingrix (1 of 2) Never done   COVID-19 Vaccine (5 - 2023-24 season) 06/14/2022   INFLUENZA VACCINE  05/15/2023   MAMMOGRAM  12/10/2023   Medicare Annual Wellness (AWV)  01/31/2024   COLONOSCOPY (Pts 45-52yrs Insurance coverage will need to be confirmed)  11/26/2025   DTaP/Tdap/Td (2 - Td or Tdap) 04/01/2026   Pneumonia Vaccine 85+ Years old  Completed   DEXA SCAN  Completed   Hepatitis C Screening  Completed   HPV VACCINES  Aged Out    Health Maintenance  Health Maintenance Due  Topic Date Due   Zoster Vaccines- Shingrix (1 of 2) Never done   COVID-19 Vaccine (5 - 2023-24 season) 06/14/2022    Colorectal cancer screening: Type of screening: Colonoscopy. Completed 11/27/2015  . Repeat every 10  years  Mammogram status: Completed 12/09/2022. Repeat every year  Bone Density status: Completed   08/13/2021     . Results reflect: Bone density results: NORMAL. Repeat every 2 years.  Lung Cancer Screening: (Low Dose CT Chest recommended if Age 86-80 years, 30 pack-year currently smoking OR have quit w/in 15years.) does not qualify.   Lung Cancer Screening Referral: N/A  Additional Screening:  Hepatitis C Screening: does qualify; Completed 02/07/2017  Vision Screening: Recommended annual ophthalmology exams for early detection of glaucoma and other disorders of the eye.  Is the patient up to  date with their annual eye exam?  Yes   Who is the provider or what is the name of the office in which the patient attends annual eye exams? Dr. Biagio Borg in 09/2022  If pt is not established with a provider, would they like to be referred to a provider to establish care? No .   Dental Screening: Recommended annual dental exams for proper oral hygiene  Community Resource Referral / Chronic Care Management: CRR required this visit?  No   CCM required this visit?  No      Plan:     I have personally reviewed and noted the following in the patient's chart:   Medical and social history Use of alcohol, tobacco or illicit drugs  Current medications and supplements including opioid prescriptions. Patient takes Opioid  Functional ability and status Nutritional status Physical activity Advanced directives List of other physicians Hospitalizations, surgeries, and ER visits in previous 12 months Vitals Screenings to include cognitive, depression, and falls Referrals and appointments  In addition, I have reviewed  and discussed with patient certain preventive protocols, quality metrics, and best practice recommendations. A written personalized care plan for preventive services as well as general preventive health recommendations were provided to patient.     Milus Mallick, CMA   01/31/2023   Nurse Notes: Discussed obtaining shingles vaccine,  Covid vaccine.  Advance directive form

## 2023-01-31 NOTE — Patient Instructions (Signed)
[name] , Thank you for taking time to come for your Medicare Wellness Visit. I appreciate your ongoing commitment to your health goals. Please review the following plan we discussed and let me know if I can assist you in the future.   These are the goals we discussed:  Goals      Patient Stated     Wants to get better, and exercise more        This is a list of the screening recommended for you and due dates:  Health Maintenance  Topic Date Due   Zoster (Shingles) Vaccine (1 of 2) Never done   COVID-19 Vaccine (5 - 2023-24 season) 06/14/2022   Flu Shot  05/15/2023   Mammogram  12/10/2023   Medicare Annual Wellness Visit  01/31/2024   Colon Cancer Screening  11/26/2025   DTaP/Tdap/Td vaccine (2 - Td or Tdap) 04/01/2026   Pneumonia Vaccine  Completed   DEXA scan (bone density measurement)  Completed   Hepatitis C Screening: USPSTF Recommendation to screen - Ages 73-79 yo.  Completed   HPV Vaccine  Aged Out    Advanced directives: Patient to pick up information from office  Conditions/risks identified: Keep up the good work  Next appointment: Follow up in one year for your annual wellness visit:  02/03/24   Preventive Care 65 Years and Older, Female Preventive care refers to lifestyle choices and visits with your health care provider that can promote health and wellness. What does preventive care include? A yearly physical exam. This is also called an annual well check. Dental exams once or twice a year. Routine eye exams. Ask your health care provider how often you should have your eyes checked. Personal lifestyle choices, including: Daily care of your teeth and gums. Regular physical activity. Eating a healthy diet. Avoiding tobacco and drug use. Limiting alcohol use. Practicing safe sex. Taking low-dose aspirin every day. Taking vitamin and mineral supplements as recommended by your health care provider. What happens during an annual well check? The services  and screenings done by your health care provider during your annual well check will depend on your age, overall health, lifestyle risk factors, and family history of disease. Counseling  Your health care provider may ask you questions about your: Alcohol use. Tobacco use. Drug use. Emotional well-being. Home and relationship well-being. Sexual activity. Eating habits. History of falls. Memory and ability to understand (cognition). Work and work Astronomer. Reproductive health. Screening  You may have the following tests or measurements: Height, weight, and BMI. Blood pressure. Lipid and cholesterol levels. These may be checked every 5 years, or more frequently if you are over 69 years old. Skin check. Lung cancer screening. You may have this screening every year starting at age 24 if you have a 30-pack-year history of smoking and currently smoke or have quit within the past 15 years. Fecal occult blood test (FOBT) of the stool. You may have this test every year starting at age 34. Flexible sigmoidoscopy or colonoscopy. You may have a sigmoidoscopy every 5 years or a colonoscopy every 10 years starting at age 3. Hepatitis C blood test. Hepatitis B blood test. Sexually transmitted disease (STD) testing. Diabetes screening. This is done by checking your blood sugar (glucose) after you have not eaten for a while (fasting). You may have this done every 1-3 years. Bone density scan. This is done to screen for osteoporosis. You may have this done starting at age 77. Mammogram. This may be done every 1-2  years. Talk to your health care provider about how often you should have regular mammograms. Talk with your health care provider about your test results, treatment options, and if necessary, the need for more tests. Vaccines  Your health care provider may recommend certain vaccines, such as: Influenza vaccine. This is recommended every year. Tetanus, diphtheria, and acellular pertussis  (Tdap, Td) vaccine. You may need a Td booster every 10 years. Zoster vaccine. You may need this after age 64. Pneumococcal 13-valent conjugate (PCV13) vaccine. One dose is recommended after age 51. Pneumococcal polysaccharide (PPSV23) vaccine. One dose is recommended after age 17. Talk to your health care provider about which screenings and vaccines you need and how often you need them. This information is not intended to replace advice given to you by your health care provider. Make sure you discuss any questions you have with your health care provider. Document Released: 10/27/2015 Document Revised: 06/19/2016 Document Reviewed: 08/01/2015 Elsevier Interactive Patient Education  2017 [place] Prevention in the Home Falls can cause injuries. They can happen to people of all ages. There are many things you can do to make your home safe and to help prevent falls. What can I do on the outside of my home? Regularly fix the edges of walkways and driveways and fix any cracks. Remove anything that might make you trip as you walk through a door, such as a raised step or threshold. Trim any bushes or trees on the path to your home. Use bright outdoor lighting. Clear any walking paths of anything that might make someone trip, such as rocks or tools. Regularly check to see if handrails are loose or broken. Make sure that both sides of any steps have handrails. Any raised decks and porches should have guardrails on the edges. Have any leaves, snow, or ice cleared regularly. Use sand or salt on walking paths during winter. Clean up any spills in your garage right away. This includes oil or grease spills. What can I do in the bathroom? Use night lights. Install grab bars by the toilet and in the tub and shower. Do not use towel bars as grab bars. Use non-skid mats or decals in the tub or shower. If you need to sit down in the shower, use a plastic, non-slip stool. Keep the floor dry. Clean  up any water that spills on the floor as soon as it happens. Remove soap buildup in the tub or shower regularly. Attach bath mats securely with double-sided non-slip rug tape. Do not have throw rugs and other things on the floor that can make you trip. What can I do in the bedroom? Use night lights. Make sure that you have a light by your bed that is easy to reach. Do not use any sheets or blankets that are too big for your bed. They should not hang down onto the floor. Have a firm chair that has side arms. You can use this for support while you get dressed. Do not have throw rugs and other things on the floor that can make you trip. What can I do in the kitchen? Clean up any spills right away. Avoid walking on wet floors. Keep items that you use a lot in easy-to-reach places. If you need to reach something above you, use a strong step stool that has a grab bar. Keep electrical cords out of the way. Do not use floor polish or wax that makes floors slippery. If you must use wax, use non-skid floor  wax. Do not have throw rugs and other things on the floor that can make you trip. What can I do with my stairs? Do not leave any items on the stairs. Make sure that there are handrails on both sides of the stairs and use them. Fix handrails that are broken or loose. Make sure that handrails are as long as the stairways. Check any carpeting to make sure that it is firmly attached to the stairs. Fix any carpet that is loose or worn. Avoid having throw rugs at the top or bottom of the stairs. If you do have throw rugs, attach them to the floor with carpet tape. Make sure that you have a light switch at the top of the stairs and the bottom of the stairs. If you do not have them, ask someone to add them for you. What else can I do to help prevent falls? Wear shoes that: Do not have high heels. Have rubber bottoms. Are comfortable and fit you well. Are closed at the toe. Do not wear sandals. If you  use a stepladder: Make sure that it is fully opened. Do not climb a closed stepladder. Make sure that both sides of the stepladder are locked into place. Ask someone to hold it for you, if possible. Clearly mark and make sure that you can see: Any grab bars or handrails. First and last steps. Where the edge of each step is. Use tools that help you move around (mobility aids) if they are needed. These include: Canes. Walkers. Scooters. Crutches. Turn on the lights when you go into a dark area. Replace any light bulbs as soon as they burn out. Set up your furniture so you have a clear path. Avoid moving your furniture around. If any of your floors are uneven, fix them. If there are any pets around you, be aware of where they are. Review your medicines with your doctor. Some medicines can make you feel dizzy. This can increase your chance of falling. Ask your doctor what other things that you can do to help prevent falls. This information is not intended to replace advice given to you by your health care provider. Make sure you discuss any questions you have with your health care provider. Document Released: 07/27/2009 Document Revised: 03/07/2016 Document Reviewed: 11/04/2014 Elsevier Interactive Patient Education  2017 [employer].

## 2023-02-18 ENCOUNTER — Ambulatory Visit (INDEPENDENT_AMBULATORY_CARE_PROVIDER_SITE_OTHER): Payer: Medicare Other | Admitting: Family Medicine

## 2023-02-18 VITALS — BP 130/80 | HR 77 | Temp 98.0°F | Resp 16 | Ht 62.0 in | Wt 148.4 lb

## 2023-02-18 DIAGNOSIS — E559 Vitamin D deficiency, unspecified: Secondary | ICD-10-CM

## 2023-02-18 DIAGNOSIS — R739 Hyperglycemia, unspecified: Secondary | ICD-10-CM | POA: Diagnosis not present

## 2023-02-18 DIAGNOSIS — Z79899 Other long term (current) drug therapy: Secondary | ICD-10-CM

## 2023-02-18 DIAGNOSIS — M25551 Pain in right hip: Secondary | ICD-10-CM

## 2023-02-18 DIAGNOSIS — T7840XA Allergy, unspecified, initial encounter: Secondary | ICD-10-CM | POA: Insufficient documentation

## 2023-02-18 DIAGNOSIS — T7840XS Allergy, unspecified, sequela: Secondary | ICD-10-CM | POA: Diagnosis not present

## 2023-02-18 DIAGNOSIS — M25552 Pain in left hip: Secondary | ICD-10-CM

## 2023-02-18 DIAGNOSIS — E785 Hyperlipidemia, unspecified: Secondary | ICD-10-CM | POA: Diagnosis not present

## 2023-02-18 DIAGNOSIS — M1711 Unilateral primary osteoarthritis, right knee: Secondary | ICD-10-CM

## 2023-02-18 DIAGNOSIS — R3915 Urgency of urination: Secondary | ICD-10-CM

## 2023-02-18 MED ORDER — CYCLOBENZAPRINE HCL 5 MG PO TABS: 2.5000 mg | ORAL_TABLET | Freq: Two times a day (BID) | ORAL | 3 refills | Status: DC | PRN

## 2023-02-18 MED ORDER — HYDROCODONE-ACETAMINOPHEN 7.5-325 MG PO TABS: 1.0000 | ORAL_TABLET | Freq: Three times a day (TID) | ORAL | 0 refills | Status: DC | PRN

## 2023-02-18 NOTE — Patient Instructions (Signed)
Orgain protein drinks   Yellow split powder  Protein meat, nuts, beans, eggs, dairy

## 2023-02-18 NOTE — Progress Notes (Addendum)
Subjective:   By signing my name below, I, Melanie Lee, attest that this documentation has been prepared under the direction and in the presence of Melanie Canary, MD., 02/18/2023.   Patient ID: Melanie Lee, female    DOB: 1945-12-29, 77 y.o.   MRN: [id number]  Chief Complaint  Patient presents with   Follow-up    Follow up   HPI Patient is in today for an office visit and is accompanied by her daughter.  Bilateral Hip Pain Patient reports that she has been experiencing excessive fatigue and bilateral hip pain for the past month. She underwent left total hip arthroplasty in 07/2020 and states that the left hip has been feeling heavier than the right hip for this past month. She continues taking Cyclobenzaprine 5 mg twice daily as needed and Hydrocodone-acetaminophen 7.5-325 mg three times daily for severe pain. She has previously received steroid injections in the hips from Dr. Durene Romans and has a follow-up appointment with him this Friday, 02/21/2023.  Urinary Frequency/Urgency Patient complains of urinary frequency and urgency but denies dysuria or hematuria. She also complains of excessive flatulence. She admits that her diet is relatively unhealthy but she is attempting to better this.   Hyperlipidemia Patient continues taking Rosuvastatin 10 mg which has been tolerable. Lab Results  Component Value Date   CHOL 148 02/18/2023   HDL 55.00 02/18/2023   LDLCALC 79 02/18/2023   TRIG 69.0 02/18/2023   CHOLHDL 3 02/18/2023   Immunizations Patient is interested in receiving the RSV immunization and will go to the pharmacy for this.  Vitamin D Deficiency Patient continues taking vitamin D 1000 IU daily.  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   S/P excision of bony prominence, right knee  04/23/2022   Past Surgical History:  Procedure Laterality Date   ABDOMINAL HYSTERECTOMY  1995   total for fibroids   BONE EXCISION Right 04/23/2022   Procedure: Excision of bony prominence Right total knee;  Surgeon: Durene Romans, MD;  Location: [facility];  Service: Orthopedics;  Laterality: Right;   BREAST BIOPSY  1995   benign   CATARACT EXTRACTION  2013 & 2015   Bilateral   KNEE SURGERY     Left Arthroscopy   right hip replacement      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/2016   Procedure: TOTAL KNEE ARTHROPLASTY;  Surgeon: Ranee Gosselin, MD;  Location: [facility];  Service: Orthopedics;  Laterality: Left;   TOTAL KNEE ARTHROPLASTY Right 12/26/2020   Procedure: TOTAL KNEE ARTHROPLASTY;  Surgeon: Durene Romans, MD;  Location: [facility];  Service: Orthopedics;  Laterality: Right;  70 mins    Family History  Problem Relation Age of Onset   Breast cancer Mother 23       Deceased   Heart disease Mother    Hypertension Mother    Hypertension Father        Deceased   Stroke Father    Hypertension Sister    Hypertension Brother    Heart defect Daughter    Hyperlipidemia Daughter    Hypertension Daughter    Vision loss Paternal Uncle    Stroke Brother     Social History   Socioeconomic History   Marital status: Single    Spouse name: Not on file   Number  of children: Not on file   Years of education: Not on file   Highest education level: Not on file  Occupational History   Not on file  Tobacco Use   Smoking status: Never   Smokeless tobacco: Never  Vaping Use   Vaping Use: Never used  Substance and Sexual Activity   Alcohol use: Never   Drug use: No   Sexual activity: Not Currently  Other Topics Concern   Not on file  Social History Narrative   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      Right handed   Social Determinants of Health   Financial Resource Strain: Low Risk  (01/31/2023)   Overall Financial Resource Strain (CARDIA)    Difficulty of Paying Living Expenses: Not hard  at all  Food Insecurity: No Food Insecurity (01/31/2023)   Hunger Vital Sign    Worried About Running Out of Food in the Last Year: Never true    Ran Out of Food in the Last Year: Never true  Transportation Needs: No Transportation Needs (01/31/2023)   PRAPARE - Administrator, Civil Service (Medical): No    Lack of Transportation (Non-Medical): No  Physical Activity: Inactive (01/31/2023)   Exercise Vital Sign    Days of Exercise per Week: 0 days    Minutes of Exercise per Session: 0 min  Stress: No Stress Concern Present (01/31/2023)   [employer] of Occupational Health - Occupational Stress Questionnaire    Feeling of Stress : Not at all  Social Connections: Moderately Integrated (01/31/2023)   Social Connection and Isolation Panel [NHANES]    Frequency of Communication with Friends and Family: More than three times a week    Frequency of Social Gatherings with Friends and Family: Once a week    Attends Religious Services: More than 4 times per year    Active Member of [employer] or Organizations: No    Attends Engineer, structural: More than 4 times per year    Marital Status: Widowed  Intimate Partner Violence: Not At Risk (01/31/2023)   Humiliation, Afraid, Rape, and Kick questionnaire    Fear of Current or Ex-Partner: No    Emotionally Abused: No    Physically Abused: No    Sexually Abused: No    Outpatient Medications Prior to Visit  Medication Sig Dispense Refill   conjugated estrogens (PREMARIN) vaginal cream Place 1 Applicatorful vaginally every Monday, Wednesday, and Friday.     fluticasone (FLONASE) 50 MCG/ACT nasal spray Place 2 sprays into both nostrils daily. (Patient taking differently: Place 2 sprays into both nostrils daily as needed for allergies.) 16 g 1   Multiple Vitamins-Minerals (MULTIVITAMIN ADULTS 50+) TABS Take 1 tablet by mouth daily.      rosuvastatin (CRESTOR) 10 MG tablet TAKE 1 TABLET BY MOUTH DAILY 90 tablet 1   cyclobenzaprine  (FLEXERIL) 5 MG tablet Take 0.5-1 tablets (2.5-5 mg total) by mouth 2 (two) times daily as needed for muscle spasms. 30 tablet 3   HYDROcodone-acetaminophen (NORCO) 7.5-325 MG tablet Take 1 tablet by mouth 3 (three) times daily as needed for severe pain. 90 tablet 0   No facility-administered medications prior to visit.    Allergies  Allergen Reactions   Tramadol Nausea Only and Other (See Comments)    Dizziness    Review of Systems  Constitutional:  Positive for malaise/fatigue. Negative for chills and fever.  Respiratory:  Negative for shortness of breath.   Cardiovascular:  Negative for chest pain and palpitations.  Gastrointestinal:  Negative for abdominal pain, blood in stool, constipation, diarrhea, nausea and vomiting.  Genitourinary:  Positive for frequency and urgency. Negative for dysuria and hematuria.  Musculoskeletal:  Positive for joint pain (bilateral hip pain).  Skin:           Neurological:  Negative for headaches.      Objective:    Physical Exam Constitutional:      General: She is not in acute distress.    Appearance: Normal appearance. She is not ill-appearing.  HENT:     Head: Normocephalic and atraumatic.     Right Ear: External ear normal.     Left Ear: External ear normal.     Nose: Nose normal.     Mouth/Throat:     Mouth: Mucous membranes are moist.     Pharynx: Oropharynx is clear.  Eyes:     General:        Right eye: No discharge.        Left eye: No discharge.     Extraocular Movements: Extraocular movements intact.     Conjunctiva/sclera: Conjunctivae normal.     Pupils: Pupils are equal, round, and reactive to light.  Cardiovascular:     Rate and Rhythm: Normal rate and regular rhythm.     Pulses: Normal pulses.     Heart sounds: Normal heart sounds. No murmur heard.    No gallop.  Pulmonary:     Effort: Pulmonary effort is normal. No respiratory distress.     Breath sounds: Normal breath sounds. No wheezing or rales.  Abdominal:      General: Bowel sounds are normal.     Palpations: Abdomen is soft.     Tenderness: There is no abdominal tenderness. There is no guarding.  Musculoskeletal:        General: Normal range of motion.     Cervical back: Normal range of motion.     Right lower leg: No edema.     Left lower leg: No edema.  Skin:    General: Skin is warm and dry.  Neurological:     Mental Status: She is alert and oriented to person, place, and time.  Psychiatric:        Mood and Affect: Mood normal.        Behavior: Behavior normal.        Judgment: Judgment normal.     BP 130/80 (BP Location: Right Arm, Patient Position: Sitting, Cuff Size: Normal)   Pulse 77   Temp 98 F (36.7 C) (Oral)   Resp 16   Ht 5\' 2"  (1.575 m)   Wt 148 lb 6.4 oz (67.3 kg)   SpO2 97%   BMI 27.14 kg/m  Wt Readings from Last 3 Encounters:  02/18/23 148 lb 6.4 oz (67.3 kg)  01/31/23 145 lb (65.8 kg)  04/23/22 151 lb (68.5 kg)    Diabetic Foot Exam - Simple   No data filed    Lab Results  Component Value Date   WBC 9.4 02/18/2023   HGB 13.7 02/18/2023   HCT 41.4 02/18/2023   PLT 374.0 02/18/2023   GLUCOSE 79 02/18/2023   CHOL 148 02/18/2023   TRIG 69.0 02/18/2023   HDL 55.00 02/18/2023   LDLCALC 79 02/18/2023   ALT 12 02/18/2023   AST 24 02/18/2023   NA 141 02/18/2023   K 4.4 02/18/2023   CL 105 02/18/2023   CREATININE 0.57 02/18/2023   BUN 11 02/18/2023   CO2 27 02/18/2023  TSH 1.82 02/18/2023   INR 1.03 04/19/2016   HGBA1C 5.8 02/18/2023    Lab Results  Component Value Date   TSH 1.82 02/18/2023   Lab Results  Component Value Date   WBC 9.4 02/18/2023   HGB 13.7 02/18/2023   HCT 41.4 02/18/2023   MCV 89.7 02/18/2023   PLT 374.0 02/18/2023   Lab Results  Component Value Date   NA 141 02/18/2023   K 4.4 02/18/2023   CHLORIDE 105 06/03/2016   CO2 27 02/18/2023   GLUCOSE 79 02/18/2023   BUN 11 02/18/2023   CREATININE 0.57 02/18/2023   BILITOT 0.5 02/18/2023   ALKPHOS 93 02/18/2023    AST 24 02/18/2023   ALT 12 02/18/2023   PROT 7.9 02/18/2023   ALBUMIN 4.6 02/18/2023   CALCIUM 9.7 02/18/2023   ANIONGAP 8 04/10/2022   EGFR >90 06/03/2016   GFR 89.25 02/18/2023   Lab Results  Component Value Date   CHOL 148 02/18/2023   Lab Results  Component Value Date   HDL 55.00 02/18/2023   Lab Results  Component Value Date   LDLCALC 79 02/18/2023   Lab Results  Component Value Date   TRIG 69.0 02/18/2023   Lab Results  Component Value Date   CHOLHDL 3 02/18/2023   Lab Results  Component Value Date   HGBA1C 5.8 02/18/2023      Assessment & Plan:  Healthy Lifestyle: Encouraged 6-8 hours of sleep, heart healthy diet, 60-80 oz of non-alcohol/non-caffeinated fluids, and minimum of 4000 steps daily. Recommended Orgain protein drinks, yellow-split-pea powder, and whey protein powder.  Immunizations: Encouraged patient to consider annual COVID-19/Influenza vaccinations and also RSV vaccination.  Labs: Routine blood work and urinary analysis ordered today.  Bilateral Hip Pain: This continues to be monitored by orthopedist Dr. Durene Romans. Patient will consider physical therapy. Hydrocodone-acetaminophen 7.5-325 mg refilled today. Cyclobenzaprine 5 mg increased from 30 tablets to 60 tablets.  Vitamin D Deficiency: Patient continues taking vitamin D 1000 IU daily. Vitamin D levels will be checked today.  Problem List Items Addressed This Visit     Allergies    Using Flonase prn      Hyperglycemia    hgba1c acceptable, minimize simple carbs. Increase exercise as tolerated.        Relevant Orders   Hemoglobin A1c (Completed)   Comprehensive metabolic panel (Completed)   TSH (Completed)   Hyperlipidemia LDL goal <100    Tolerating statin, encouraged heart healthy diet, avoid trans fats, minimize simple carbs and saturated fats. Increase exercise as tolerated      Relevant Orders   Lipid panel (Completed)   TSH (Completed)   Osteoarthritis of right knee     Encouraged to stay as active as able may use pain meds sparingly prn      Relevant Medications   HYDROcodone-acetaminophen (NORCO) 7.5-325 MG tablet   cyclobenzaprine (FLEXERIL) 5 MG tablet   Vitamin D deficiency - Primary    ,=Supplement and monitor       Relevant Orders   VITAMIN D 25 Hydroxy (Vit-D Deficiency, Fractures) (Completed)   Hip pain, bilateral    Encouraged moist heat and gentle stretching as tolerated. May try NSAIDs and prescription meds as directed and report if symptoms worsen or seek immediate care       Urinary urgency    Check urinalysis and urine culture      Relevant Orders   CBC with Differential/Platelet (Completed)   Urinalysis, Routine w reflex microscopic (Completed)   Urine  Culture (Completed)   Other Visit Diagnoses     High risk medication use       Relevant Orders   Drug Monitoring Panel [phone] , Urine (Completed)   DM TEMPLATE (Completed)      Meds ordered this encounter  Medications   HYDROcodone-acetaminophen (NORCO) 7.5-325 MG tablet    Sig: Take 1 tablet by mouth 3 (three) times daily as needed for severe pain.    Dispense:  90 tablet    Refill:  0   cyclobenzaprine (FLEXERIL) 5 MG tablet    Sig: Take 0.5-1 tablets (2.5-5 mg total) by mouth 2 (two) times daily as needed for muscle spasms.    Dispense:  60 tablet    Refill:  3   I, Danise Edge, MD, personally preformed the services described in this documentation.  All medical record entries made by the scribe were at my direction and in my presence.  I have reviewed the chart and discharge instructions (if applicable) and agree that the record reflects my personal performance and is accurate and complete. 02/18/2023  I,Mohammed Iqbal,acting as a scribe for Danise Edge, MD.,have documented all relevant documentation on the behalf of Danise Edge, MD,as directed by  Danise Edge, MD while in the presence of Danise Edge, MD.  Danise Edge, MD

## 2023-02-18 NOTE — Assessment & Plan Note (Signed)
hgba1c acceptable, minimize simple carbs. Increase exercise as tolerated.  

## 2023-02-18 NOTE — Assessment & Plan Note (Signed)
Using Flonase prn.

## 2023-02-18 NOTE — Assessment & Plan Note (Signed)
Tolerating statin, encouraged heart healthy diet, avoid trans fats, minimize simple carbs and saturated fats. Increase exercise as tolerated 

## 2023-02-18 NOTE — Assessment & Plan Note (Addendum)
Encouraged to stay as active as able may use pain meds sparingly prn

## 2023-02-18 NOTE — Assessment & Plan Note (Signed)
,=  Supplement and monitor 

## 2023-02-19 LAB — CBC WITH DIFFERENTIAL/PLATELET
Basophils Absolute: 0.1 10*3/uL (ref 0.0–0.1)
Basophils Relative: 0.7 % (ref 0.0–3.0)
Eosinophils Absolute: 0.1 10*3/uL (ref 0.0–0.7)
Eosinophils Relative: 0.5 % (ref 0.0–5.0)
HCT: 41.4 % (ref 36.0–46.0)
Hemoglobin: 13.7 g/dL (ref 12.0–15.0)
Lymphocytes Relative: 15.7 % (ref 12.0–46.0)
Lymphs Abs: 1.5 10*3/uL (ref 0.7–4.0)
MCHC: 33 g/dL (ref 30.0–36.0)
MCV: 89.7 fl (ref 78.0–100.0)
Monocytes Absolute: 0.7 10*3/uL (ref 0.1–1.0)
Monocytes Relative: 7.3 % (ref 3.0–12.0)
Neutro Abs: 7.1 10*3/uL (ref 1.4–7.7)
Neutrophils Relative %: 75.8 % (ref 43.0–77.0)
Platelets: 374 10*3/uL (ref 150.0–400.0)
RBC: 4.62 Mil/uL (ref 3.87–5.11)
RDW: 12.3 % (ref 11.5–15.5)
WBC: 9.4 10*3/uL (ref 4.0–10.5)

## 2023-02-19 LAB — COMPREHENSIVE METABOLIC PANEL
ALT: 12 U/L (ref 0–35)
AST: 24 U/L (ref 0–37)
Albumin: 4.6 g/dL (ref 3.5–5.2)
Alkaline Phosphatase: 93 U/L (ref 39–117)
BUN: 11 mg/dL (ref 6–23)
CO2: 27 mEq/L (ref 19–32)
Calcium: 9.7 mg/dL (ref 8.4–10.5)
Chloride: 105 mEq/L (ref 96–112)
Creatinine, Ser: 0.57 mg/dL (ref 0.40–1.20)
GFR: 89.25 mL/min (ref >60.00)
Glucose, Bld: 79 mg/dL (ref 70–99)
Potassium: 4.4 mEq/L (ref 3.5–5.1)
Sodium: 141 mEq/L (ref 135–145)
Total Bilirubin: 0.5 mg/dL (ref 0.2–1.2)
Total Protein: 7.9 g/dL (ref 6.0–8.3)

## 2023-02-19 LAB — LIPID PANEL
Cholesterol: 148 mg/dL (ref 0–200)
HDL: 55 mg/dL (ref >39.00)
LDL Cholesterol: 79 mg/dL (ref 0–99)
NonHDL: 92.78
Total CHOL/HDL Ratio: 3
Triglycerides: 69 mg/dL (ref 0.0–149.0)
VLDL: 13.8 mg/dL (ref 0.0–40.0)

## 2023-02-19 LAB — URINALYSIS, ROUTINE W REFLEX MICROSCOPIC
Bilirubin Urine: NEGATIVE
Hgb urine dipstick: NEGATIVE
Ketones, ur: NEGATIVE
Leukocytes,Ua: NEGATIVE
Nitrite: NEGATIVE
RBC / HPF: NONE SEEN (ref 0–?)
Specific Gravity, Urine: >=1.03 — AB (ref 1.000–1.030)
Total Protein, Urine: NEGATIVE
Urine Glucose: NEGATIVE
Urobilinogen, UA: 0.2 (ref 0.0–1.0)
pH: 5.5 (ref 5.0–8.0)

## 2023-02-19 LAB — DRUG MONITORING PANEL 376104, URINE
Amphetamines: NEGATIVE ng/mL (ref <500)
Barbiturates: NEGATIVE ng/mL (ref <300)
Benzodiazepines: NEGATIVE ng/mL (ref <100)
Cocaine Metabolite: NEGATIVE ng/mL (ref <150)
Desmethyltramadol: NEGATIVE ng/mL (ref <100)
Opiates: NEGATIVE ng/mL (ref <100)
Oxycodone: NEGATIVE ng/mL (ref <100)
Tramadol: NEGATIVE ng/mL (ref <100)

## 2023-02-19 LAB — URINE CULTURE
MICRO NUMBER:: 14923921
Result:: NO GROWTH
SPECIMEN QUALITY:: ADEQUATE

## 2023-02-19 LAB — HEMOGLOBIN A1C: Hgb A1c MFr Bld: 5.8 % (ref 4.6–6.5)

## 2023-02-19 LAB — VITAMIN D 25 HYDROXY (VIT D DEFICIENCY, FRACTURES): VITD: 31.44 ng/mL (ref 30.00–100.00)

## 2023-02-19 LAB — TSH: TSH: 1.82 u[IU]/mL (ref 0.35–5.50)

## 2023-02-19 LAB — DM TEMPLATE

## 2023-02-21 DIAGNOSIS — Z96642 Presence of left artificial hip joint: Secondary | ICD-10-CM | POA: Diagnosis not present

## 2023-02-21 DIAGNOSIS — M25552 Pain in left hip: Secondary | ICD-10-CM | POA: Diagnosis not present

## 2023-02-21 DIAGNOSIS — M25551 Pain in right hip: Secondary | ICD-10-CM | POA: Diagnosis not present

## 2023-02-21 DIAGNOSIS — R3915 Urgency of urination: Secondary | ICD-10-CM | POA: Insufficient documentation

## 2023-02-21 NOTE — Assessment & Plan Note (Signed)
Check urinalysis and urine culture 

## 2023-02-21 NOTE — Assessment & Plan Note (Signed)
Encouraged moist heat and gentle stretching as tolerated. May try NSAIDs and prescription meds as directed and report if symptoms worsen or seek immediate care 

## 2023-02-26 ENCOUNTER — Telehealth: Payer: Self-pay

## 2023-02-26 ENCOUNTER — Telehealth: Payer: Self-pay | Admitting: Family Medicine

## 2023-02-26 NOTE — Telephone Encounter (Signed)
Called pt spoke regarding labs

## 2023-02-26 NOTE — Telephone Encounter (Signed)
Called pt was advised

## 2023-02-26 NOTE — Telephone Encounter (Signed)
Pt called to go over lab results. All CMAs were unavailable to go over results at the time and advised pt that a call would be placed back to her at a later time

## 2023-03-20 ENCOUNTER — Other Ambulatory Visit: Payer: Self-pay | Admitting: Family Medicine

## 2023-03-20 ENCOUNTER — Telehealth: Payer: Self-pay | Admitting: Family Medicine

## 2023-03-20 MED ORDER — HYDROCODONE-ACETAMINOPHEN 7.5-325 MG PO TABS: 1.0000 | ORAL_TABLET | Freq: Three times a day (TID) | ORAL | 0 refills | Status: DC | PRN

## 2023-03-20 NOTE — Telephone Encounter (Signed)
Med Refill Request (Patient called0  HYDROcodone-acetaminophen (NORCO) 7.5-325 MG tablet   [facility] [id number] - [place], [place] - [address]

## 2023-04-07 DIAGNOSIS — M7061 Trochanteric bursitis, right hip: Secondary | ICD-10-CM | POA: Diagnosis not present

## 2023-04-07 DIAGNOSIS — M7062 Trochanteric bursitis, left hip: Secondary | ICD-10-CM | POA: Diagnosis not present

## 2023-04-16 ENCOUNTER — Other Ambulatory Visit: Payer: Self-pay | Admitting: Family Medicine

## 2023-04-16 ENCOUNTER — Telehealth: Payer: Self-pay | Admitting: Family Medicine

## 2023-04-16 MED ORDER — HYDROCODONE-ACETAMINOPHEN 7.5-325 MG PO TABS: 1.0000 | ORAL_TABLET | Freq: Three times a day (TID) | ORAL | 0 refills | Status: DC | PRN

## 2023-04-16 NOTE — Telephone Encounter (Signed)
Prescription Request  04/16/2023  Is this a "Controlled Substance" medicine? Yes  LOV: 02/18/2023 ( pt's daughter called in early due to holiday)   What is the name of the medication or equipment?  HYDROcodone-acetaminophen (NORCO) 7.5-325 MG tablet   Have you contacted your pharmacy to request a refill? No   Which pharmacy would you like this sent to?  [facility] [id number] - [place], [place] - [address] [place] [address] Phone: [phone] Fax: [phone]    Patient notified that their request is being sent to the clinical staff for review and that they should receive a response within 2 business days.   Please advise at Mobile [phone] (mobile)

## 2023-05-20 ENCOUNTER — Telehealth: Payer: Self-pay | Admitting: Family Medicine

## 2023-05-20 ENCOUNTER — Other Ambulatory Visit: Payer: Self-pay | Admitting: Family Medicine

## 2023-05-20 MED ORDER — HYDROCODONE-ACETAMINOPHEN 7.5-325 MG PO TABS: 1.0000 | ORAL_TABLET | Freq: Three times a day (TID) | ORAL | 0 refills | Status: DC | PRN

## 2023-05-20 NOTE — Telephone Encounter (Signed)
Prescription Request  05/20/2023  Is this a "Controlled Substance" medicine? Yes  LOV: 02/18/2023  What is the name of the medication or equipment? HYDROcodone-acetaminophen (NORCO) 7.5-325 MG tablet  Have you contacted your pharmacy to request a refill? No   Which pharmacy would you like this sent to?  [facility] [id number] - [place], [place] - [address] [place]  [address] Phone: [phone] Fax: [phone]    Patient notified that their request is being sent to the clinical staff for review and that they should receive a response within 2 business days.   Please advise at Mobile [phone] (mobile)

## 2023-05-28 NOTE — Assessment & Plan Note (Signed)
Tolerating statin, encouraged heart healthy diet, avoid trans fats, minimize simple carbs and saturated fats. Increase exercise as tolerated 

## 2023-05-28 NOTE — Assessment & Plan Note (Addendum)
Encouraged moist heat and gentle stretching as tolerated. May try Tylenol and prescription meds as directed and report if symptoms worsen or seek immediate care

## 2023-05-28 NOTE — Assessment & Plan Note (Signed)
,=  Supplement and monitor 

## 2023-05-28 NOTE — Assessment & Plan Note (Signed)
hgba1c acceptable, minimize simple carbs. Increase exercise as tolerated.  

## 2023-05-29 ENCOUNTER — Ambulatory Visit: Payer: Medicare Other | Admitting: Family Medicine

## 2023-05-29 VITALS — BP 128/76 | HR 89 | Temp 99.4°F | Resp 16 | Ht 62.0 in | Wt 145.8 lb

## 2023-05-29 DIAGNOSIS — M170 Bilateral primary osteoarthritis of knee: Secondary | ICD-10-CM

## 2023-05-29 DIAGNOSIS — R739 Hyperglycemia, unspecified: Secondary | ICD-10-CM

## 2023-05-29 DIAGNOSIS — R350 Frequency of micturition: Secondary | ICD-10-CM | POA: Diagnosis not present

## 2023-05-29 DIAGNOSIS — E559 Vitamin D deficiency, unspecified: Secondary | ICD-10-CM

## 2023-05-29 DIAGNOSIS — E785 Hyperlipidemia, unspecified: Secondary | ICD-10-CM

## 2023-05-29 LAB — URINALYSIS, ROUTINE W REFLEX MICROSCOPIC
Bilirubin Urine: NEGATIVE
Hgb urine dipstick: NEGATIVE
Ketones, ur: NEGATIVE
Leukocytes,Ua: NEGATIVE
Nitrite: NEGATIVE
RBC / HPF: NONE SEEN (ref 0–?)
Specific Gravity, Urine: 1.01 (ref 1.000–1.030)
Total Protein, Urine: NEGATIVE
Urine Glucose: NEGATIVE
Urobilinogen, UA: 0.2 (ref 0.0–1.0)
pH: 6 (ref 5.0–8.0)

## 2023-05-29 MED ORDER — HYDROCODONE-ACETAMINOPHEN 7.5-325 MG PO TABS: 1.0000 | ORAL_TABLET | Freq: Three times a day (TID) | ORAL | 0 refills | Status: DC | PRN

## 2023-05-29 NOTE — Patient Instructions (Signed)
Urinary Tract Infection, Adult  A urinary tract infection (UTI) is an infection of any part of the urinary tract. The urinary tract includes the kidneys, ureters, bladder, and urethra. These organs make, store, and get rid of urine in the body. An upper UTI affects the ureters and kidneys. A lower UTI affects the bladder and urethra. What are the causes? Most urinary tract infections are caused by bacteria in your genital area around your urethra, where urine leaves your body. These bacteria grow and cause inflammation of your urinary tract. What increases the risk? You are more likely to develop this condition if: You have a urinary catheter that stays in place. You are not able to control when you urinate or have a bowel movement (incontinence). You are female and you: Use a spermicide or diaphragm for birth control. Have low estrogen levels. Are pregnant. You have certain genes that increase your risk. You are sexually active. You take antibiotic medicines. You have a condition that causes your flow of urine to slow down, such as: An enlarged prostate, if you are female. Blockage in your urethra. A kidney stone. A nerve condition that affects your bladder control (neurogenic bladder). Not getting enough to drink, or not urinating often. You have certain medical conditions, such as: Diabetes. A weak disease-fighting system (immunesystem). Sickle cell disease. Gout. Spinal cord injury. What are the signs or symptoms? Symptoms of this condition include: Needing to urinate right away (urgency). Frequent urination. This may include small amounts of urine each time you urinate. Pain or burning with urination. Blood in the urine. Urine that smells bad or unusual. Trouble urinating. Cloudy urine. Vaginal discharge, if you are female. Pain in the abdomen or the lower back. You may also have: Vomiting or a decreased appetite. Confusion. Irritability or tiredness. A fever or  chills. Diarrhea. The first symptom in older adults may be confusion. In some cases, they may not have any symptoms until the infection has worsened. How is this diagnosed? This condition is diagnosed based on your medical history and a physical exam. You may also have other tests, including: Urine tests. Blood tests. Tests for STIs (sexually transmitted infections). If you have had more than one UTI, a cystoscopy or imaging studies may be done to determine the cause of the infections. How is this treated? Treatment for this condition includes: Antibiotic medicine. Over-the-counter medicines to treat discomfort. Drinking enough water to stay hydrated. If you have frequent infections or have other conditions such as a kidney stone, you may need to see a health care provider who specializes in the urinary tract (urologist). In rare cases, urinary tract infections can cause sepsis. Sepsis is a life-threatening condition that occurs when the body responds to an infection. Sepsis is treated in the hospital with IV antibiotics, fluids, and other medicines. Follow these instructions at home:  Medicines Take over-the-counter and prescription medicines only as told by your health care provider. If you were prescribed an antibiotic medicine, take it as told by your health care provider. Do not stop using the antibiotic even if you start to feel better. General instructions Make sure you: Empty your bladder often and completely. Do not hold urine for long periods of time. Empty your bladder after sex. Wipe from front to back after urinating or having a bowel movement if you are female. Use each tissue only one time when you wipe. Drink enough fluid to keep your urine pale yellow. Keep all follow-up visits. This is important. Contact a health   care provider if: Your symptoms do not get better after 1-2 days. Your symptoms go away and then return. Get help right away if: You have severe pain in  your back or your lower abdomen. You have a fever or chills. You have nausea or vomiting. Summary A urinary tract infection (UTI) is an infection of any part of the urinary tract, which includes the kidneys, ureters, bladder, and urethra. Most urinary tract infections are caused by bacteria in your genital area. Treatment for this condition often includes antibiotic medicines. If you were prescribed an antibiotic medicine, take it as told by your health care provider. Do not stop using the antibiotic even if you start to feel better. Keep all follow-up visits. This is important. This information is not intended to replace advice given to you by your health care provider. Make sure you discuss any questions you have with your health care provider. Document Revised: 05/07/2020 Document Reviewed: 05/12/2020 Elsevier Patient Education  2024 [employer]  

## 2023-05-31 LAB — URINE CULTURE
MICRO NUMBER:: 15335802
SPECIMEN QUALITY:: ADEQUATE

## 2023-06-01 ENCOUNTER — Encounter: Payer: Self-pay | Admitting: Family Medicine

## 2023-06-01 NOTE — Progress Notes (Signed)
Subjective:    Patient ID: Melanie Lee, female    DOB: 1946-10-14, 77 y.o.   MRN: [id number]  Chief Complaint  Patient presents with  . Follow-up    Follow up    HPI Discussed the use of AI scribe software for clinical note transcription with the patient, who gave verbal consent to proceed.  History of Present Illness   The patient, with a history of hypertension, hyperlipidemia, osteoporosis, and hysterectomy due to fibroids, presents with a chief complaint of right hip pain. The pain is chronic and has been managed by an orthopedic specialist with periodic injections for arthritis and inflammation. The patient reports temporary relief from these injections. The patient is scheduled for a follow-up with the orthopedic specialist in two weeks.  In addition to the hip pain, the patient reports frequent urination, especially in the morning. The patient's family member notes that the patient's urine has an unusual smell. The patient acknowledges having bladder problems but does not specify the nature of these problems.  The patient also mentions a recent history of thrush, which they attribute to being hyperglycemia. The patient reports that they have managed to bring their blood sugar levels down, but they still experience some unspecified issues.  The patient's family member inquires about the patient's pain medication, hydrocodone, and its usage. The patient is also on rosuvastatin for hyperlipidemia. The patient inquires about the necessity of continuing this medication, given that their cholesterol levels are currently within the normal range.        Past Medical History:  Diagnosis Date  . Elevated blood pressure (not hypertension)    "elevates sometimes with MD visits"  . H/O measles   . Osteoarthritis (arthritis due to wear and tear of joints)    Bilateral knees  . S/P excision of bony prominence, right knee 04/23/2022    Past Surgical History:  Procedure Laterality Date   . ABDOMINAL HYSTERECTOMY  1995   total for fibroids  . BONE EXCISION Right 04/23/2022   Procedure: Excision of bony prominence Right total knee;  Surgeon: Durene Romans, MD;  Location: [facility];  Service: Orthopedics;  Laterality: Right;  . BREAST BIOPSY  1995   benign  . CATARACT EXTRACTION  2013 & 2015   Bilateral  . KNEE SURGERY     Left Arthroscopy  . right hip replacement     . 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/2016   Procedure: TOTAL KNEE ARTHROPLASTY;  Surgeon: Ranee Gosselin, MD;  Location: [facility];  Service: Orthopedics;  Laterality: Left;  . TOTAL KNEE ARTHROPLASTY Right 12/26/2020   Procedure: TOTAL KNEE ARTHROPLASTY;  Surgeon: Durene Romans, MD;  Location: [facility];  Service: Orthopedics;  Laterality: Right;  70 mins    Family History  Problem Relation Age of Onset  . Breast cancer Mother 76       Deceased  . Heart disease Mother   . Hypertension Mother   . Hypertension Father        Deceased  . Stroke Father   . Hypertension Sister   . Hypertension Brother   . Heart defect Daughter   . Hyperlipidemia Daughter   . Hypertension Daughter   . Vision loss Paternal Uncle   . Stroke Brother     Social History   Socioeconomic History  . Marital status: Single    Spouse name: Not on file  . Number of children: Not on file  . Years of education: Not on file  . Highest education level: Not on  file  Occupational History  . Not on file  Tobacco Use  . Smoking status: Never  . Smokeless tobacco: Never  Vaping Use  . Vaping status: Never Used  Substance and Sexual Activity  . Alcohol use: Never  . Drug use: No  . Sexual activity: Not Currently  Other Topics Concern  . 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      Right handed   Social Determinants of Health   Financial Resource Strain: Low Risk  (01/31/2023)   Overall Financial Resource Strain (CARDIA)   . Difficulty of Paying Living Expenses: Not hard at all  Food Insecurity: No  Food Insecurity (01/31/2023)   Hunger Vital Sign   . Worried About Programme researcher, broadcasting/film/video in the Last Year: Never true   . Ran Out of Food in the Last Year: Never true  Transportation Needs: No Transportation Needs (01/31/2023)   PRAPARE - Transportation   . Lack of Transportation (Medical): No   . Lack of Transportation (Non-Medical): No  Physical Activity: Inactive (01/31/2023)   Exercise Vital Sign   . Days of Exercise per Week: 0 days   . Minutes of Exercise per Session: 0 min  Stress: No Stress Concern Present (01/31/2023)   [employer] of Occupational Health - Occupational Stress Questionnaire   . Feeling of Stress : Not at all  Social Connections: Moderately Integrated (01/31/2023)   Social Connection and Isolation Panel [NHANES]   . Frequency of Communication with Friends and Family: More than three times a week   . Frequency of Social Gatherings with Friends and Family: Once a week   . Attends Religious Services: More than 4 times per year   . Active Member of Clubs or Organizations: No   . Attends Banker Meetings: More than 4 times per year   . Marital Status: Widowed  Intimate Partner Violence: Not At Risk (01/31/2023)   Humiliation, Afraid, Rape, and Kick questionnaire   . Fear of Current or Ex-Partner: No   . Emotionally Abused: No   . Physically Abused: No   . Sexually Abused: No    Outpatient Medications Prior to Visit  Medication Sig Dispense Refill  . conjugated estrogens (PREMARIN) vaginal cream Place 1 Applicatorful vaginally every Monday, Wednesday, and Friday.    . cyclobenzaprine (FLEXERIL) 5 MG tablet Take 0.5-1 tablets (2.5-5 mg total) by mouth 2 (two) times daily as needed for muscle spasms. 60 tablet 3  . fluticasone (FLONASE) 50 MCG/ACT nasal spray Place 2 sprays into both nostrils daily. (Patient taking differently: Place 2 sprays into both nostrils daily as needed for allergies.) 16 g 1  . Multiple Vitamins-Minerals (MULTIVITAMIN ADULTS 50+)  TABS Take 1 tablet by mouth daily.     . rosuvastatin (CRESTOR) 10 MG tablet TAKE 1 TABLET BY MOUTH DAILY 90 tablet 1  . HYDROcodone-acetaminophen (NORCO) 7.5-325 MG tablet Take 1 tablet by mouth 3 (three) times daily as needed for severe pain. 90 tablet 0   No facility-administered medications prior to visit.    Allergies  Allergen Reactions  . Tramadol Nausea Only and Other (See Comments)    Dizziness    Review of Systems  Constitutional:  Negative for fever and malaise/fatigue.  HENT:  Negative for congestion.   Eyes:  Negative for blurred vision.  Respiratory:  Negative for shortness of breath.   Cardiovascular:  Negative for chest pain, palpitations and leg swelling.  Gastrointestinal:  Negative for abdominal pain, blood  in stool and nausea.  Genitourinary:  Positive for frequency. Negative for dysuria.  Musculoskeletal:  Positive for back pain and joint pain. Negative for falls.  Skin:  Negative for rash.  Neurological:  Negative for dizziness, loss of consciousness and headaches.  Endo/Heme/Allergies:  Negative for environmental allergies.  Psychiatric/Behavioral:  Negative for depression. The patient is not nervous/anxious.        Objective:    Physical Exam Constitutional:      General: She is not in acute distress.    Appearance: Normal appearance. She is well-developed. She is not toxic-appearing.  HENT:     Head: Normocephalic and atraumatic.     Right Ear: External ear normal.     Left Ear: External ear normal.     Nose: Nose normal.  Eyes:     General:        Right eye: No discharge.        Left eye: No discharge.     Conjunctiva/sclera: Conjunctivae normal.  Neck:     Thyroid: No thyromegaly.  Cardiovascular:     Rate and Rhythm: Normal rate and regular rhythm.     Heart sounds: Normal heart sounds. No murmur heard. Pulmonary:     Effort: Pulmonary effort is normal. No respiratory distress.     Breath sounds: Normal breath sounds.  Abdominal:      General: Bowel sounds are normal.     Palpations: Abdomen is soft.     Tenderness: There is no abdominal tenderness. There is no guarding.  Musculoskeletal:        General: Normal range of motion.     Cervical back: Neck supple.  Lymphadenopathy:     Cervical: No cervical adenopathy.  Skin:    General: Skin is warm and dry.  Neurological:     Mental Status: She is alert and oriented to person, place, and time.  Psychiatric:        Mood and Affect: Mood normal.        Behavior: Behavior normal.        Thought Content: Thought content normal.        Judgment: Judgment normal.    BP 128/76 (BP Location: Left Arm, Patient Position: Sitting, Cuff Size: Normal)   Pulse 89   Temp 99.4 F (37.4 C) (Oral)   Resp 16   Ht 5\' 2"  (1.575 m)   Wt 145 lb 12.8 oz (66.1 kg)   SpO2 97%   BMI 26.67 kg/m  Wt Readings from Last 3 Encounters:  05/29/23 145 lb 12.8 oz (66.1 kg)  02/18/23 148 lb 6.4 oz (67.3 kg)  01/31/23 145 lb (65.8 kg)    Diabetic Foot Exam - Simple   No data filed    Lab Results  Component Value Date   WBC 9.4 02/18/2023   HGB 13.7 02/18/2023   HCT 41.4 02/18/2023   PLT 374.0 02/18/2023   GLUCOSE 79 02/18/2023   CHOL 148 02/18/2023   TRIG 69.0 02/18/2023   HDL 55.00 02/18/2023   LDLCALC 79 02/18/2023   ALT 12 02/18/2023   AST 24 02/18/2023   NA 141 02/18/2023   K 4.4 02/18/2023   CL 105 02/18/2023   CREATININE 0.57 02/18/2023   BUN 11 02/18/2023   CO2 27 02/18/2023   TSH 1.82 02/18/2023   INR 1.03 04/19/2016   HGBA1C 5.8 02/18/2023    Lab Results  Component Value Date   TSH 1.82 02/18/2023   Lab Results  Component Value Date  WBC 9.4 02/18/2023   HGB 13.7 02/18/2023   HCT 41.4 02/18/2023   MCV 89.7 02/18/2023   PLT 374.0 02/18/2023   Lab Results  Component Value Date   NA 141 02/18/2023   K 4.4 02/18/2023   CHLORIDE 105 06/03/2016   CO2 27 02/18/2023   GLUCOSE 79 02/18/2023   BUN 11 02/18/2023   CREATININE 0.57 02/18/2023   BILITOT 0.5  02/18/2023   ALKPHOS 93 02/18/2023   AST 24 02/18/2023   ALT 12 02/18/2023   PROT 7.9 02/18/2023   ALBUMIN 4.6 02/18/2023   CALCIUM 9.7 02/18/2023   ANIONGAP 8 04/10/2022   EGFR >90 06/03/2016   GFR 89.25 02/18/2023   Lab Results  Component Value Date   CHOL 148 02/18/2023   Lab Results  Component Value Date   HDL 55.00 02/18/2023   Lab Results  Component Value Date   LDLCALC 79 02/18/2023   Lab Results  Component Value Date   TRIG 69.0 02/18/2023   Lab Results  Component Value Date   CHOLHDL 3 02/18/2023   Lab Results  Component Value Date   HGBA1C 5.8 02/18/2023       Assessment & Plan:  Hyperglycemia Assessment & Plan: hgba1c acceptable, minimize simple carbs. Increase exercise as tolerated.     Hyperlipidemia LDL goal <100 Assessment & Plan: Tolerating statin, encouraged heart healthy diet, avoid trans fats, minimize simple carbs and saturated fats. Increase exercise as tolerated   Vitamin D deficiency Assessment & Plan: ,=Supplement and monitor    Primary osteoarthritis of both knees Assessment & Plan: Encouraged moist heat and gentle stretching as tolerated. May try Tylenol and prescription meds as directed and report if symptoms worsen or seek immediate care    Urinary frequency -     Urinalysis, Routine w reflex microscopic -     Urine Culture  Other orders -     HYDROcodone-Acetaminophen; Take 1 tablet by mouth 3 (three) times daily as needed for severe pain.  Dispense: 90 tablet; Refill: 0    Assessment and Plan    Right Hip Pain Arthritis and inflammation diagnosed by Dr. Val Eagle. Discussed the nature of arthritis and the potential for intermittent inflammation. Patient is due for a follow-up with Dr. Val Eagle in two weeks. -Continue current management plan with Dr. Val Eagle.  Increased Urination Patient reports increased frequency of urination, especially in the morning. No other urinary symptoms reported. -Collect urine sample today for  analysis.  Hyperlipidemia Cholesterol levels are well-controlled on rosuvastatin (Crestor). Discussed the importance of continued medication to maintain healthy cholesterol levels. -Continue rosuvastatin as prescribed. -Check cholesterol levels at next visit in December.  Bone Health Last bone density scan in 2022 was normal. Discussed the importance of continued movement and calcium intake for bone health. -Continue current lifestyle modifications for bone health. -Next bone density scan due in 2027.  Pain Management Patient is on hydrocodone for pain management. Discussed the potential side effects and the importance of consistent dosing. -Continue hydrocodone as prescribed, adjusting dose as needed on better days.  General Health Maintenance -Continue taking Vitamin D supplement and multivitamin. -Encouraged to maintain physical activity, aiming for a minimum of 4000 steps per day. -Plan to receive flu shot in September or October. -Next appointment scheduled for December.         Danise Edge, MD

## 2023-06-09 DIAGNOSIS — M25551 Pain in right hip: Secondary | ICD-10-CM | POA: Diagnosis not present

## 2023-06-09 DIAGNOSIS — M5136 Other intervertebral disc degeneration, lumbar region: Secondary | ICD-10-CM | POA: Diagnosis not present

## 2023-06-21 DIAGNOSIS — M7061 Trochanteric bursitis, right hip: Secondary | ICD-10-CM | POA: Diagnosis not present

## 2023-07-02 DIAGNOSIS — M1611 Unilateral primary osteoarthritis, right hip: Secondary | ICD-10-CM | POA: Diagnosis not present

## 2023-07-02 DIAGNOSIS — M545 Low back pain, unspecified: Secondary | ICD-10-CM | POA: Diagnosis not present

## 2023-07-04 ENCOUNTER — Telehealth: Payer: Self-pay

## 2023-07-04 ENCOUNTER — Other Ambulatory Visit: Payer: Self-pay | Admitting: Family

## 2023-07-04 MED ORDER — HYDROCODONE-ACETAMINOPHEN 7.5-325 MG PO TABS: 1.0000 | ORAL_TABLET | Freq: Three times a day (TID) | ORAL | 0 refills | Status: DC | PRN

## 2023-07-04 NOTE — Telephone Encounter (Signed)
Pt daughter is asking for refill on Hydrocodone, Karin [facility] on [address]

## 2023-07-31 ENCOUNTER — Other Ambulatory Visit: Payer: Self-pay

## 2023-07-31 ENCOUNTER — Telehealth: Payer: Self-pay | Admitting: Family Medicine

## 2023-07-31 MED ORDER — CYCLOBENZAPRINE HCL 5 MG PO TABS: 2.5000 mg | ORAL_TABLET | Freq: Two times a day (BID) | ORAL | 3 refills | Status: DC | PRN

## 2023-07-31 NOTE — Telephone Encounter (Signed)
Refill sent.

## 2023-07-31 NOTE — Telephone Encounter (Signed)
Prescription Request  07/31/2023  Is this a "Controlled Substance" medicine? No  LOV: 05/29/2023  What is the name of the medication or equipment?   cyclobenzaprine (FLEXERIL) 5 MG tablet [[id number]]  Have you contacted your pharmacy to request a refill? No   Which pharmacy would you like this sent to?  [facility] [id number] - [place] - [address] [place] [address] Phone: [phone] Fax: [phone]    Patient notified that their request is being sent to the clinical staff for review and that they should receive a response within 2 business days.   Please advise at Mobile [phone] (mobile)

## 2023-08-11 ENCOUNTER — Telehealth: Payer: Self-pay | Admitting: Family Medicine

## 2023-08-11 ENCOUNTER — Other Ambulatory Visit: Payer: Self-pay | Admitting: Family Medicine

## 2023-08-11 DIAGNOSIS — N3281 Overactive bladder: Secondary | ICD-10-CM | POA: Diagnosis not present

## 2023-08-11 MED ORDER — HYDROCODONE-ACETAMINOPHEN 7.5-325 MG PO TABS: 1.0000 | ORAL_TABLET | Freq: Three times a day (TID) | ORAL | 0 refills | Status: DC | PRN

## 2023-08-11 NOTE — Telephone Encounter (Signed)
Requesting: HYDROCODONE 7.5/325MG  Contract: No  Will get at next visit UDS: 02/18/23 Last Visit: 05/29/2023 Next Visit: 09/15/2023 Last Refill: 07/04/23  Please Advise

## 2023-08-11 NOTE — Telephone Encounter (Signed)
Prescription Request  08/11/2023  Is this a "Controlled Substance" medicine? Yes  LOV: 05/29/2023  What is the name of the medication or equipment? HYDROcodone-acetaminophen (NORCO) 7.5-325 MG tablet  Have you contacted your pharmacy to request a refill? No   Which pharmacy would you like this sent to?  [facility] [id number] - [place] - [address] [place] [address] Phone: [phone] Fax: [phone]    Patient notified that their request is being sent to the clinical staff for review and that they should receive a response within 2 business days.   Please advise at [facility] [phone]

## 2023-08-13 DIAGNOSIS — M1611 Unilateral primary osteoarthritis, right hip: Secondary | ICD-10-CM | POA: Diagnosis not present

## 2023-09-03 DIAGNOSIS — Z7409 Other reduced mobility: Secondary | ICD-10-CM | POA: Diagnosis not present

## 2023-09-03 DIAGNOSIS — M25551 Pain in right hip: Secondary | ICD-10-CM | POA: Diagnosis not present

## 2023-09-03 DIAGNOSIS — M51369 Other intervertebral disc degeneration, lumbar region without mention of lumbar back pain or lower extremity pain: Secondary | ICD-10-CM | POA: Diagnosis not present

## 2023-09-03 DIAGNOSIS — R29898 Other symptoms and signs involving the musculoskeletal system: Secondary | ICD-10-CM | POA: Diagnosis not present

## 2023-09-08 ENCOUNTER — Other Ambulatory Visit: Payer: Self-pay | Admitting: Family Medicine

## 2023-09-08 ENCOUNTER — Telehealth: Payer: Self-pay | Admitting: Family Medicine

## 2023-09-08 MED ORDER — HYDROCODONE-ACETAMINOPHEN 7.5-325 MG PO TABS: 1.0000 | ORAL_TABLET | Freq: Three times a day (TID) | ORAL | 0 refills | Status: DC | PRN

## 2023-09-08 NOTE — Telephone Encounter (Signed)
Requesting:Norco - 7.5-325 Contract:02/18/23 UDS:02/18/23 Last Visit:05/29/23 Next Visit:09/16/23 Last Refill:08/11/23  Please Advise

## 2023-09-08 NOTE — Telephone Encounter (Signed)
Prescription Request  09/08/2023  Is this a "Controlled Substance" medicine? Yes  LOV: 05/29/2023  What is the name of the medication or equipment?   HYDROcodone-acetaminophen (NORCO) 7.5-325 MG tablet  Have you contacted your pharmacy to request a refill? No   Which pharmacy would you like this sent to?  [facility] [id number] - [place] - [address] [place] [address] Phone: [phone] Fax: [phone]    Patient notified that their request is being sent to the clinical staff for review and that they should receive a response within 2 business days.   Please advise at Mobile [phone] (mobile)

## 2023-09-15 ENCOUNTER — Encounter: Payer: Medicare Other | Admitting: Family Medicine

## 2023-09-15 DIAGNOSIS — M25551 Pain in right hip: Secondary | ICD-10-CM | POA: Diagnosis not present

## 2023-09-15 DIAGNOSIS — Z7409 Other reduced mobility: Secondary | ICD-10-CM | POA: Diagnosis not present

## 2023-09-15 DIAGNOSIS — R29898 Other symptoms and signs involving the musculoskeletal system: Secondary | ICD-10-CM | POA: Diagnosis not present

## 2023-09-15 DIAGNOSIS — M51369 Other intervertebral disc degeneration, lumbar region without mention of lumbar back pain or lower extremity pain: Secondary | ICD-10-CM | POA: Diagnosis not present

## 2023-09-16 ENCOUNTER — Ambulatory Visit: Payer: Medicare Other | Admitting: Family Medicine

## 2023-09-16 ENCOUNTER — Encounter: Payer: Self-pay | Admitting: Family Medicine

## 2023-09-16 VITALS — BP 128/80 | HR 92 | Ht 62.0 in | Wt 149.0 lb

## 2023-09-16 DIAGNOSIS — Z0000 Encounter for general adult medical examination without abnormal findings: Secondary | ICD-10-CM

## 2023-09-16 DIAGNOSIS — R739 Hyperglycemia, unspecified: Secondary | ICD-10-CM

## 2023-09-16 DIAGNOSIS — E785 Hyperlipidemia, unspecified: Secondary | ICD-10-CM | POA: Diagnosis not present

## 2023-09-16 DIAGNOSIS — E559 Vitamin D deficiency, unspecified: Secondary | ICD-10-CM | POA: Diagnosis not present

## 2023-09-16 LAB — COMPREHENSIVE METABOLIC PANEL
ALT: 15 U/L (ref 0–35)
AST: 25 U/L (ref 0–37)
Albumin: 4.4 g/dL (ref 3.5–5.2)
Alkaline Phosphatase: 86 U/L (ref 39–117)
BUN: 8 mg/dL (ref 6–23)
CO2: 27 meq/L (ref 19–32)
Calcium: 9.4 mg/dL (ref 8.4–10.5)
Chloride: 106 meq/L (ref 96–112)
Creatinine, Ser: 0.55 mg/dL (ref 0.40–1.20)
GFR: 89.67 mL/min (ref >60.00)
Glucose, Bld: 98 mg/dL (ref 70–99)
Potassium: 3.9 meq/L (ref 3.5–5.1)
Sodium: 140 meq/L (ref 135–145)
Total Bilirubin: 0.6 mg/dL (ref 0.2–1.2)
Total Protein: 7.6 g/dL (ref 6.0–8.3)

## 2023-09-16 LAB — CBC WITH DIFFERENTIAL/PLATELET
Basophils Absolute: 0 10*3/uL (ref 0.0–0.1)
Basophils Relative: 0.4 % (ref 0.0–3.0)
Eosinophils Absolute: 0 10*3/uL (ref 0.0–0.7)
Eosinophils Relative: 0.4 % (ref 0.0–5.0)
HCT: 42.2 % (ref 36.0–46.0)
Hemoglobin: 13.8 g/dL (ref 12.0–15.0)
Lymphocytes Relative: 21.8 % (ref 12.0–46.0)
Lymphs Abs: 1.8 10*3/uL (ref 0.7–4.0)
MCHC: 32.7 g/dL (ref 30.0–36.0)
MCV: 91 fL (ref 78.0–100.0)
Monocytes Absolute: 0.6 10*3/uL (ref 0.1–1.0)
Monocytes Relative: 7.5 % (ref 3.0–12.0)
Neutro Abs: 5.6 10*3/uL (ref 1.4–7.7)
Neutrophils Relative %: 69.9 % (ref 43.0–77.0)
Platelets: 342 10*3/uL (ref 150.0–400.0)
RBC: 4.64 Mil/uL (ref 3.87–5.11)
RDW: 13.2 % (ref 11.5–15.5)
WBC: 8.1 10*3/uL (ref 4.0–10.5)

## 2023-09-16 LAB — LIPID PANEL
Cholesterol: 156 mg/dL (ref 0–200)
HDL: 52.2 mg/dL (ref >39.00)
LDL Cholesterol: 83 mg/dL (ref 0–99)
NonHDL: 103.8
Total CHOL/HDL Ratio: 3
Triglycerides: 104 mg/dL (ref 0.0–149.0)
VLDL: 20.8 mg/dL (ref 0.0–40.0)

## 2023-09-16 LAB — HEMOGLOBIN A1C: Hgb A1c MFr Bld: 5.8 % (ref 4.6–6.5)

## 2023-09-16 LAB — TSH: TSH: 3.12 u[IU]/mL (ref 0.35–5.50)

## 2023-09-16 LAB — VITAMIN D 25 HYDROXY (VIT D DEFICIENCY, FRACTURES): VITD: 24.52 ng/mL — ABNORMAL LOW (ref 30.00–100.00)

## 2023-09-16 NOTE — Progress Notes (Signed)
Complete physical exam  Patient: Melanie Lee   DOB: [birth year]   77 y.o. Female  MRN: [id number]  Subjective:    Chief Complaint  Patient presents with   Annual Exam    Melanie Lee is a 77 y.o. female who presents today for a complete physical exam. She reports consuming a general diet. The patient does not participate in regular exercise at present. She generally feels well. She reports sleeping well. She does not have additional problems to discuss today.   Currently lives with: alone Acute concerns or interim problems since last visit: no  Vision concerns: no Dental concerns: no   ETOH use: no Nicotine use: no Recreational drugs/illegal substances: no    Most recent fall risk assessment:    09/16/2023   10:30 AM  Fall Risk   Falls in the past year? 0  Number falls in past yr: 0  Injury with Fall? 0  Risk for fall due to : No Fall Risks  Follow up Falls evaluation completed     Most recent depression screenings:    09/16/2023   10:30 AM 05/29/2023   10:50 AM  PHQ 2/9 Scores  PHQ - 2 Score 2 0  PHQ- 9 Score 4        09/16/2023   10:30 AM 02/18/2023    1:18 PM  GAD 7 : Generalized Anxiety Score  Nervous, Anxious, on Edge 0 0  Control/stop worrying 2 0  Worry too much - different things 2 0  Trouble relaxing 0 0  Restless 0 0  Easily annoyed or irritable 0 0  Afraid - awful might happen 2 0  Total GAD 7 Score 6 0  Anxiety Difficulty Somewhat difficult Not difficult at all            Patient Care Team: Bradd Canary, MD as PCP - General (Family Medicine) Sydnee Cabal., MD as Referring Physician (Gastroenterology)   Outpatient Medications Prior to Visit  Medication Sig   conjugated estrogens (PREMARIN) vaginal cream Place 1 Applicatorful vaginally every Monday, Wednesday, and Friday.   cyclobenzaprine (FLEXERIL) 5 MG tablet Take 0.5-1 tablets (2.5-5 mg total) by mouth 2 (two) times daily as needed for muscle spasms.   fluticasone  (FLONASE) 50 MCG/ACT nasal spray Place 2 sprays into both nostrils daily. (Patient taking differently: Place 2 sprays into both nostrils daily as needed for allergies.)   HYDROcodone-acetaminophen (NORCO) 7.5-325 MG tablet Take 1 tablet by mouth 3 (three) times daily as needed for severe pain (pain score 7-10).   Multiple Vitamins-Minerals (MULTIVITAMIN ADULTS 50+) TABS Take 1 tablet by mouth daily.    rosuvastatin (CRESTOR) 10 MG tablet TAKE 1 TABLET BY MOUTH DAILY   No facility-administered medications prior to visit.    ROS All review of systems negative except what is listed in the HPI        Objective:     BP 128/80   Pulse 92   Ht 5\' 2"  (1.575 m)   Wt 149 lb (67.6 kg)   SpO2 98%   BMI 27.25 kg/m    Physical Exam      No results found for any visits on 09/16/23.     Assessment & Plan:    Routine Health Maintenance and Physical Exam Discussed health promotion and safety including diet and exercise recommendations, dental health, and injury prevention. Tobacco cessation if applicable. Seat belts, sunscreen, smoke detectors, etc.    Immunization History  Administered Date(s) Administered   Fluad Quad(high Dose  65+) 07/31/2021   Influenza, High Dose Seasonal PF 09/14/2018, 07/25/2020   Influenza,inj,Quad PF,6+ Mos 06/24/2016   Influenza,inj,quad, With Preservative 10/20/2018   Influenza-Unspecified 06/21/2023   Moderna Sars-Covid-2 Vaccination 11/22/2019, 12/22/2019, 10/16/2020, 04/03/2021   Pneumococcal Conjugate-13 10/28/2014   Pneumococcal Polysaccharide-23 01/01/2016   Respiratory Syncytial Virus Vaccine,Recomb Aduvanted(Arexvy) 09/05/2023   Tdap 04/01/2016   Zoster, Live 03/21/2015    Health Maintenance  Topic Date Due   COVID-19 Vaccine (5 - 2023-24 season) 10/01/2023 (Originally 06/15/2023)   Zoster Vaccines- Shingrix (1 of 2) 12/15/2023 (Originally [birth year]/16/1967)   MAMMOGRAM  12/10/2023   Medicare Annual Wellness (AWV)  01/31/2024   Colonoscopy   11/26/2025   DTaP/Tdap/Td (2 - Td or Tdap) 04/01/2026   Pneumonia Vaccine 73+ Years old  Completed   INFLUENZA VACCINE  Completed   DEXA SCAN  Completed   Hepatitis C Screening  Completed   HPV VACCINES  Aged Out        Problem List Items Addressed This Visit       Active Problems   Hyperglycemia   Relevant Orders   Hemoglobin A1c   Hyperlipidemia LDL goal <100   Relevant Orders   Comprehensive metabolic panel   Lipid panel   Vitamin D deficiency   Relevant Orders   VITAMIN D 25 Hydroxy (Vit-D Deficiency, Fractures)   Other Visit Diagnoses     Annual physical exam    -  Primary   Relevant Orders   CBC with Differential/Platelet   Comprehensive metabolic panel   Lipid panel   TSH   Hemoglobin A1c       Return in about 3 months (around 12/15/2023) for routine follow-up.     Clayborne Dana, NP

## 2023-09-17 DIAGNOSIS — Z7409 Other reduced mobility: Secondary | ICD-10-CM | POA: Diagnosis not present

## 2023-09-17 DIAGNOSIS — M25551 Pain in right hip: Secondary | ICD-10-CM | POA: Diagnosis not present

## 2023-09-17 DIAGNOSIS — M51369 Other intervertebral disc degeneration, lumbar region without mention of lumbar back pain or lower extremity pain: Secondary | ICD-10-CM | POA: Diagnosis not present

## 2023-09-17 DIAGNOSIS — R29898 Other symptoms and signs involving the musculoskeletal system: Secondary | ICD-10-CM | POA: Diagnosis not present

## 2023-09-22 DIAGNOSIS — R29898 Other symptoms and signs involving the musculoskeletal system: Secondary | ICD-10-CM | POA: Diagnosis not present

## 2023-09-22 DIAGNOSIS — Z7409 Other reduced mobility: Secondary | ICD-10-CM | POA: Diagnosis not present

## 2023-09-22 DIAGNOSIS — M25551 Pain in right hip: Secondary | ICD-10-CM | POA: Diagnosis not present

## 2023-09-22 DIAGNOSIS — M51369 Other intervertebral disc degeneration, lumbar region without mention of lumbar back pain or lower extremity pain: Secondary | ICD-10-CM | POA: Diagnosis not present

## 2023-09-24 DIAGNOSIS — M25551 Pain in right hip: Secondary | ICD-10-CM | POA: Diagnosis not present

## 2023-09-24 DIAGNOSIS — Z7409 Other reduced mobility: Secondary | ICD-10-CM | POA: Diagnosis not present

## 2023-09-24 DIAGNOSIS — R29898 Other symptoms and signs involving the musculoskeletal system: Secondary | ICD-10-CM | POA: Diagnosis not present

## 2023-09-24 DIAGNOSIS — M51369 Other intervertebral disc degeneration, lumbar region without mention of lumbar back pain or lower extremity pain: Secondary | ICD-10-CM | POA: Diagnosis not present

## 2023-09-29 DIAGNOSIS — M25551 Pain in right hip: Secondary | ICD-10-CM | POA: Diagnosis not present

## 2023-09-29 DIAGNOSIS — M51369 Other intervertebral disc degeneration, lumbar region without mention of lumbar back pain or lower extremity pain: Secondary | ICD-10-CM | POA: Diagnosis not present

## 2023-09-29 DIAGNOSIS — Z7409 Other reduced mobility: Secondary | ICD-10-CM | POA: Diagnosis not present

## 2023-09-29 DIAGNOSIS — R29898 Other symptoms and signs involving the musculoskeletal system: Secondary | ICD-10-CM | POA: Diagnosis not present

## 2023-10-02 DIAGNOSIS — M25551 Pain in right hip: Secondary | ICD-10-CM | POA: Diagnosis not present

## 2023-10-02 DIAGNOSIS — M51369 Other intervertebral disc degeneration, lumbar region without mention of lumbar back pain or lower extremity pain: Secondary | ICD-10-CM | POA: Diagnosis not present

## 2023-10-02 DIAGNOSIS — Z7409 Other reduced mobility: Secondary | ICD-10-CM | POA: Diagnosis not present

## 2023-10-02 DIAGNOSIS — R29898 Other symptoms and signs involving the musculoskeletal system: Secondary | ICD-10-CM | POA: Diagnosis not present

## 2023-10-06 ENCOUNTER — Other Ambulatory Visit: Payer: Self-pay | Admitting: Family Medicine

## 2023-10-06 DIAGNOSIS — M25551 Pain in right hip: Secondary | ICD-10-CM | POA: Diagnosis not present

## 2023-10-06 DIAGNOSIS — M51369 Other intervertebral disc degeneration, lumbar region without mention of lumbar back pain or lower extremity pain: Secondary | ICD-10-CM | POA: Diagnosis not present

## 2023-10-06 DIAGNOSIS — R29898 Other symptoms and signs involving the musculoskeletal system: Secondary | ICD-10-CM | POA: Diagnosis not present

## 2023-10-06 DIAGNOSIS — Z7409 Other reduced mobility: Secondary | ICD-10-CM | POA: Diagnosis not present

## 2023-10-06 NOTE — Telephone Encounter (Signed)
Copied from CRM [id number]. Topic: Clinical - Medication Refill >> Oct 06, 2023 12:52 PM Dimitri Ped wrote: Most Recent Primary Care Visit:  Provider: Clayborne Dana  Department: [facility]  Visit Type: PHYSICAL  Date: 09/16/2023  Medication: ***  Has the patient contacted their pharmacy?  (Agent: If no, request that the patient contact the pharmacy for the refill. If patient does not wish to contact the pharmacy document the reason why and proceed with request.) (Agent: If yes, when and what did the pharmacy advise?)  Is this the correct pharmacy for this prescription?  If no, delete pharmacy and type the correct one.  This is the patient's preferred pharmacy:  [facility] [id number] - [place], [place] - [address] [place] [address] Phone: [phone] Fax: [phone]   Has the prescription been filled recently?   Is the patient out of the medication?   Has the patient been seen for an appointment in the last year OR does the patient have an upcoming appointment?   Can we respond through MyChart?   Agent: Please be advised that Rx refills may take up to 3 business days. We ask that you follow-up with your pharmacy.

## 2023-10-09 DIAGNOSIS — Z7409 Other reduced mobility: Secondary | ICD-10-CM | POA: Diagnosis not present

## 2023-10-09 DIAGNOSIS — M51369 Other intervertebral disc degeneration, lumbar region without mention of lumbar back pain or lower extremity pain: Secondary | ICD-10-CM | POA: Diagnosis not present

## 2023-10-09 DIAGNOSIS — R29898 Other symptoms and signs involving the musculoskeletal system: Secondary | ICD-10-CM | POA: Diagnosis not present

## 2023-10-09 DIAGNOSIS — M25551 Pain in right hip: Secondary | ICD-10-CM | POA: Diagnosis not present

## 2023-10-13 DIAGNOSIS — Z7409 Other reduced mobility: Secondary | ICD-10-CM | POA: Diagnosis not present

## 2023-10-13 DIAGNOSIS — M51369 Other intervertebral disc degeneration, lumbar region without mention of lumbar back pain or lower extremity pain: Secondary | ICD-10-CM | POA: Diagnosis not present

## 2023-10-13 DIAGNOSIS — R29898 Other symptoms and signs involving the musculoskeletal system: Secondary | ICD-10-CM | POA: Diagnosis not present

## 2023-10-13 DIAGNOSIS — M25551 Pain in right hip: Secondary | ICD-10-CM | POA: Diagnosis not present

## 2023-10-16 ENCOUNTER — Other Ambulatory Visit: Payer: Self-pay

## 2023-10-16 DIAGNOSIS — R29898 Other symptoms and signs involving the musculoskeletal system: Secondary | ICD-10-CM | POA: Diagnosis not present

## 2023-10-16 DIAGNOSIS — M25551 Pain in right hip: Secondary | ICD-10-CM | POA: Diagnosis not present

## 2023-10-16 DIAGNOSIS — M51369 Other intervertebral disc degeneration, lumbar region without mention of lumbar back pain or lower extremity pain: Secondary | ICD-10-CM | POA: Diagnosis not present

## 2023-10-16 DIAGNOSIS — Z7409 Other reduced mobility: Secondary | ICD-10-CM | POA: Diagnosis not present

## 2023-10-16 MED ORDER — HYDROCODONE-ACETAMINOPHEN 7.5-325 MG PO TABS: 1.0000 | ORAL_TABLET | Freq: Three times a day (TID) | ORAL | 0 refills | Status: DC | PRN

## 2023-10-16 NOTE — Telephone Encounter (Signed)
 Requesting: NORCO Contract:03/11/23 UDS:02/18/23 Last Visit:09/16/23 Next Visit:01/15/24 Last Refill:09/08/23  Please Advise           Copied from CRM #[id number]. Topic: Clinical - Prescription Issue >> Oct 16, 2023  2:13 PM Melanie Lee wrote: Reason for CRM: Patient daughter is calling in because pharmacy has stated they do not have her medication for HYDROcodone -acetaminophen  (NORCO) 7.5-325 MG tablet ,See refill request on 12/23 but pharmacy has stated they have not receive it  Patients daughter has also requested a callback once sent to pharmacy

## 2023-10-23 DIAGNOSIS — M51369 Other intervertebral disc degeneration, lumbar region without mention of lumbar back pain or lower extremity pain: Secondary | ICD-10-CM | POA: Diagnosis not present

## 2023-10-23 DIAGNOSIS — R29898 Other symptoms and signs involving the musculoskeletal system: Secondary | ICD-10-CM | POA: Diagnosis not present

## 2023-10-23 DIAGNOSIS — M25551 Pain in right hip: Secondary | ICD-10-CM | POA: Diagnosis not present

## 2023-10-23 DIAGNOSIS — Z7409 Other reduced mobility: Secondary | ICD-10-CM | POA: Diagnosis not present

## 2023-10-27 DIAGNOSIS — M51369 Other intervertebral disc degeneration, lumbar region without mention of lumbar back pain or lower extremity pain: Secondary | ICD-10-CM | POA: Diagnosis not present

## 2023-10-27 DIAGNOSIS — R29898 Other symptoms and signs involving the musculoskeletal system: Secondary | ICD-10-CM | POA: Diagnosis not present

## 2023-10-27 DIAGNOSIS — M25551 Pain in right hip: Secondary | ICD-10-CM | POA: Diagnosis not present

## 2023-10-27 DIAGNOSIS — Z7409 Other reduced mobility: Secondary | ICD-10-CM | POA: Diagnosis not present

## 2023-10-28 ENCOUNTER — Other Ambulatory Visit: Payer: Self-pay | Admitting: Medical

## 2023-11-07 DIAGNOSIS — H524 Presbyopia: Secondary | ICD-10-CM | POA: Diagnosis not present

## 2023-11-07 DIAGNOSIS — H02413 Mechanical ptosis of bilateral eyelids: Secondary | ICD-10-CM | POA: Diagnosis not present

## 2023-11-07 DIAGNOSIS — H04123 Dry eye syndrome of bilateral lacrimal glands: Secondary | ICD-10-CM | POA: Diagnosis not present

## 2023-11-07 DIAGNOSIS — H26493 Other secondary cataract, bilateral: Secondary | ICD-10-CM | POA: Diagnosis not present

## 2023-11-07 DIAGNOSIS — Z961 Presence of intraocular lens: Secondary | ICD-10-CM | POA: Diagnosis not present

## 2023-11-11 ENCOUNTER — Other Ambulatory Visit: Payer: Self-pay | Admitting: Family Medicine

## 2023-11-11 DIAGNOSIS — N3281 Overactive bladder: Secondary | ICD-10-CM | POA: Diagnosis not present

## 2023-11-11 MED ORDER — HYDROCODONE-ACETAMINOPHEN 7.5-325 MG PO TABS: 1.0000 | ORAL_TABLET | Freq: Three times a day (TID) | ORAL | 0 refills | Status: DC | PRN

## 2023-11-11 NOTE — Telephone Encounter (Signed)
Copied from CRM [id number]. Topic: Clinical - Medication Refill >> Nov 11, 2023  3:47 PM Truddie Crumble wrote: Most Recent Primary Care Visit:  Provider: Clayborne Dana  Department: [facility]  Visit Type: PHYSICAL  Date: 09/16/2023  Medication: HYDROcodone-acetaminophen  Has the patient contacted their pharmacy? Yes (Agent: If no, request that the patient contact the pharmacy for the refill. If patient does not wish to contact the pharmacy document the reason why and proceed with request.) (Agent: If yes, when and what did the pharmacy advise?)  Is this the correct pharmacy for this prescription? Yes If no, delete pharmacy and type the correct one.  This is the patient's preferred pharmacy:  [facility] [id number] - [place] - [address] [place] [address] Phone: [phone] Fax: [phone]   Has the prescription been filled recently? Yes  Is the patient out of the medication? Yes  Has the patient been seen for an appointment in the last year OR does the patient have an upcoming appointment? Yes  Can we respond through MyChart? No  Agent: Please be advised that Rx refills may take up to 3 business days. We ask that you follow-up with your pharmacy.

## 2023-11-11 NOTE — Telephone Encounter (Signed)
Requesting: Norco 7.5-325 Contract: 02/18/2023 UDS: 02/18/2023 Last Visit: 09/16/2023 Next Visit: 01/15/2024 Last Refill: 10/16/2023  Please Advise

## 2023-12-09 ENCOUNTER — Other Ambulatory Visit: Payer: Self-pay | Admitting: Family Medicine

## 2023-12-09 MED ORDER — HYDROCODONE-ACETAMINOPHEN 7.5-325 MG PO TABS: 1.0000 | ORAL_TABLET | Freq: Three times a day (TID) | ORAL | 0 refills | Status: DC | PRN

## 2023-12-09 NOTE — Telephone Encounter (Signed)
 Copied from CRM [id number]. Topic: Clinical - Medication Refill >> Dec 09, 2023 11:28 AM Arm[place] J wrote: Most Recent Primary Care Visit:  Provider: Clayborne Dana  Department: [facility]  Visit Type: PHYSICAL  Date: 09/16/2023  Medication: HYDROcodone-acetaminophen (NORCO) 7.5-325 MG tablet  Has the patient contacted their pharmacy? Yes (Agent: If no, request that the patient contact the pharmacy for the refill. If patient does not wish to contact the pharmacy document the reason why and proceed with request.) (Agent: If yes, when and what did the pharmacy advise?)  Is this the correct pharmacy for this prescription? Yes If no, delete pharmacy and type the correct one.  This is the patient's preferred pharmacy:  [facility] [id number] - [place], [place] - [address] [place] [address] Phone: [phone] Fax: [phone]   Has the prescription been filled recently? No  Is the patient out of the medication? Yes  Has the patient been seen for an appointment in the last year OR does the patient have an upcoming appointment? Yes  Can we respond through MyChart? Yes  Agent: Please be advised that Rx refills may take up to 3 business days. We ask that you follow-up with your pharmacy.

## 2023-12-09 NOTE — Telephone Encounter (Signed)
 Requesting: Norco 7.5-325 MG Contract: 02/18/2023 UDS: 02/18/2023 Last Visit: 09/16/2023 Next Visit: 01/15/2024 Last Refill: 11/11/2023  Please Advise

## 2023-12-09 NOTE — Telephone Encounter (Signed)
 Last Fill: 11/11/23 90 tabs/0 refills  Last OV: 09/16/23 Next OV: 01/15/24  Routing to provider for review/authorization.

## 2023-12-11 DIAGNOSIS — Z1231 Encounter for screening mammogram for malignant neoplasm of breast: Secondary | ICD-10-CM | POA: Diagnosis not present

## 2023-12-11 LAB — HM MAMMOGRAPHY

## 2023-12-13 ENCOUNTER — Other Ambulatory Visit: Payer: Self-pay | Admitting: Family Medicine

## 2023-12-16 ENCOUNTER — Encounter: Payer: Self-pay | Admitting: Family Medicine

## 2024-01-12 ENCOUNTER — Other Ambulatory Visit: Payer: Self-pay | Admitting: Family Medicine

## 2024-01-12 NOTE — Telephone Encounter (Signed)
 Copied from CRM [id number]. Topic: Clinical - Medication Refill >> Jan 12, 2024  1:08 PM Eunice Blase wrote: Most Recent Primary Care Visit:  Provider: Clayborne Dana  Department: [facility]  Visit Type: PHYSICAL  Date: 09/16/2023  Medication: HYDROcodone-acetaminophen (NORCO) 7.5-325 MG tablet  Has the patient contacted their pharmacy? Yes (Agent: If no, request that the patient contact the pharmacy for the refill. If patient does not wish to contact the pharmacy document the reason why and proceed with request.) (Agent: If yes, when and what did the pharmacy advise?)Pharmacy need PCP approval  Is this the correct pharmacy for this prescription? Yes If no, delete pharmacy and type the correct one.  This is the patient's preferred pharmacy:  [facility] [id number] - [place], [place] - [address] [place] [address] Phone: [phone] Fax: [phone]   Has the prescription been filled recently? Yes  Is the patient out of the medication? Yes  Has the patient been seen for an appointment in the last year OR does the patient have an upcoming appointment? Yes  Can we respond through MyChart? Yes  Agent: Please be advised that Rx refills may take up to 3 business days. We ask that you follow-up with your pharmacy.

## 2024-01-13 MED ORDER — HYDROCODONE-ACETAMINOPHEN 7.5-325 MG PO TABS: 1.0000 | ORAL_TABLET | Freq: Three times a day (TID) | ORAL | 0 refills | Status: DC | PRN

## 2024-01-13 NOTE — Assessment & Plan Note (Signed)
,=  Supplement and monitor 

## 2024-01-13 NOTE — Assessment & Plan Note (Signed)
 hgba1c acceptable, minimize simple carbs. Increase exercise as tolerated.

## 2024-01-13 NOTE — Telephone Encounter (Signed)
 Requesting: hydrocodone 7.5-325mg   Contract: 02/18/23 UDS: 02/18/23 Last Visit: 09/16/23 Next Visit: 01/15/24 Last Refill: 12/09/23 #90 and 0RF   Please Advise

## 2024-01-13 NOTE — Assessment & Plan Note (Signed)
 Tolerating statin, encouraged heart healthy diet, avoid trans fats, minimize simple carbs and saturated fats. Increase exercise as tolerated

## 2024-01-15 ENCOUNTER — Ambulatory Visit (INDEPENDENT_AMBULATORY_CARE_PROVIDER_SITE_OTHER): Payer: Medicare Other | Admitting: Family Medicine

## 2024-01-15 VITALS — BP 124/78 | HR 83 | Temp 99.3°F | Resp 18 | Ht 64.0 in | Wt 145.2 lb

## 2024-01-15 DIAGNOSIS — M25551 Pain in right hip: Secondary | ICD-10-CM | POA: Diagnosis not present

## 2024-01-15 DIAGNOSIS — E559 Vitamin D deficiency, unspecified: Secondary | ICD-10-CM

## 2024-01-15 DIAGNOSIS — Z79899 Other long term (current) drug therapy: Secondary | ICD-10-CM | POA: Diagnosis not present

## 2024-01-15 DIAGNOSIS — Z860100 Personal history of colon polyps, unspecified: Secondary | ICD-10-CM | POA: Diagnosis not present

## 2024-01-15 DIAGNOSIS — M170 Bilateral primary osteoarthritis of knee: Secondary | ICD-10-CM | POA: Diagnosis not present

## 2024-01-15 DIAGNOSIS — R739 Hyperglycemia, unspecified: Secondary | ICD-10-CM

## 2024-01-15 DIAGNOSIS — E785 Hyperlipidemia, unspecified: Secondary | ICD-10-CM | POA: Diagnosis not present

## 2024-01-15 MED ORDER — FLUTICASONE PROPIONATE 50 MCG/ACT NA SUSP: 2.0000 | Freq: Every day | NASAL | 3 refills | Status: AC | PRN

## 2024-01-15 NOTE — Patient Instructions (Signed)
Allergies, Adult An allergy is a condition that causes the body's defense system (immune system) to react too strongly to an allergen. An allergen is a substance that is harmless to most people but can cause a reaction in some people. Allergies often affect the nose (allergic rhinitis), eyes (conjunctivitis), skin (atopic dermatitis), and stomach. They can be mild, moderate, or severe. They cannot spread from person to person. Allergies can start at any age. In some cases, they may go away as you get older. What are the causes? Allergies are caused by allergens. These may be: Outdoor allergens. These include pollen, car fumes, and mold. Indoor allergens. These include dust, smoke, mold, and pet dander. Other allergens. These include foods, medicines, scents, and insect bites or stings. What increases the risk? You are more likely to have allergies if you have: Family members with allergies. Family members who have a condition that may be caused by allergens, such as asthma. What are the signs or symptoms? Symptoms depend on how severe your allergy is. Mild to moderate symptoms Runny nose, stuffy nose (nasal congestion), or sneezing. Itchy mouth, ears, or throat. Postnasal drip. This is a feeling of mucus dripping down the back of your throat. Sore throat. Itchy, red, watery, or puffy eyes. Skin rash, or itchy, red, swollen areas of skin (hives). Stomach cramps or bloating. Severe symptoms A bad allergy to food, medicine, or insect bites may cause a severe reaction (anaphylactic reaction). Symptoms include: A red face. Coughing or making high-pitched whistling sounds when you breathe, most often when you breathe out (wheezing). Swollen lips, tongue, or mouth. A tight or swollen throat. Chest pain or tightness, or a fast heartbeat. Trouble breathing or shortness of breath. Pain in your abdomen. Vomiting or diarrhea. Feeling dizzy or fainting. How is this diagnosed? Allergies are  diagnosed based on your symptoms, your family and medical history, and a physical exam. You may also have tests, such as: Skin tests. These may be done to see how your skin reacts to allergens. Tests include: Skin prick test. For this test, the allergen is put in your body through a small prick in the skin. Intradermal skin test. For this test, a small amount of the allergen is put under the first layer of your skin. Patch test. For this test, a small amount of the allergen is placed on your skin. The area is covered and then checked after a few days. Blood tests. A challenge test. For this test, you eat or breathe in the allergen to see if you have a reaction. You may also be asked to: Keep a food diary. This means writing down all the foods, drinks, and symptoms you have in a day. Try an elimination diet. To do this: Stop eating certain foods. Add those foods back one by one to find out if any of them cause a reaction. How is this treated?     Treatment for allergies depends on your symptoms. It may include: Cold, wet cloths (cold compresses). These can be used to soothe itching and swelling. Eye drops or nasal sprays. A saline solution to clear out your nose and keep it moist (nasal irrigation). A saline solution is made of salt and water. A humidifier. This can add moisture to the air. Skin creams. These can treat rashes or itching. Diet changes to cut out foods that cause allergies. Being exposed again and again to tiny amounts of allergens. This can help your body build a defense against them (tolerance). This process   is called immunotherapy. It may be done using: Allergy shots. This is when you get a shot of the allergen. Sublingual immunotherapy. This is when you take a small dose of the allergen under your tongue. Allergy medicines (antihistamines) or other medicines. These can help block the allergic reaction. Using an auto-injector pen. An auto-injector pen is a device filled  with medicine that gives an emergency shot of epinephrine. Your health care provider will teach you how to use it. Follow these instructions at home: Medicines  Take or apply over-the-counter and prescription medicines only as told by your provider. Always carry your auto-injector pen if you are at risk of an anaphylactic reaction. Give yourself the shot as told by your provider. Eating and drinking Follow instructions from your provider about what you may eat and drink. Drink enough fluid to keep your pee (urine) pale yellow. General instructions Wear a medical alert bracelet or necklace if you have had an anaphylactic reaction in the past. Avoid known allergens when you can. Keep all follow-up visits. Your provider will watch your symptoms and talk about treatment options with you. Contact a health care provider if: Your symptoms do not get better with treatment. Get help right away if: You have any symptoms of anaphylactic reaction. You use an auto-injector pen. You will need more medical care even if the medicine seems to be working. An anaphylactic reaction may happen again within 72 hours (rebound anaphylaxis). These symptoms may be an emergency. Use the auto-injector pen right away. Then call 911. Do not wait to see if the symptoms will go away. Do not drive yourself to the hospital. This information is not intended to replace advice given to you by your health care provider. Make sure you discuss any questions you have with your health care provider. Document Revised: 06/12/2022 Document Reviewed: 06/12/2022 Elsevier Patient Education  2024 Elsevier Inc.  

## 2024-01-16 ENCOUNTER — Encounter: Payer: Self-pay | Admitting: Family Medicine

## 2024-01-16 LAB — LIPID PANEL
Cholesterol: 165 mg/dL (ref 0–200)
HDL: 52.7 mg/dL (ref >39.00)
LDL Cholesterol: 91 mg/dL (ref 0–99)
NonHDL: 112.09
Total CHOL/HDL Ratio: 3
Triglycerides: 106 mg/dL (ref 0.0–149.0)
VLDL: 21.2 mg/dL (ref 0.0–40.0)

## 2024-01-16 LAB — CBC WITH DIFFERENTIAL/PLATELET
Basophils Absolute: 0 10*3/uL (ref 0.0–0.1)
Basophils Relative: 0.4 % (ref 0.0–3.0)
Eosinophils Absolute: 0.1 10*3/uL (ref 0.0–0.7)
Eosinophils Relative: 0.9 % (ref 0.0–5.0)
HCT: 43.1 % (ref 36.0–46.0)
Hemoglobin: 14 g/dL (ref 12.0–15.0)
Lymphocytes Relative: 23.9 % (ref 12.0–46.0)
Lymphs Abs: 1.8 10*3/uL (ref 0.7–4.0)
MCHC: 32.5 g/dL (ref 30.0–36.0)
MCV: 89.9 fl (ref 78.0–100.0)
Monocytes Absolute: 0.6 10*3/uL (ref 0.1–1.0)
Monocytes Relative: 8.4 % (ref 3.0–12.0)
Neutro Abs: 5.1 10*3/uL (ref 1.4–7.7)
Neutrophils Relative %: 66.4 % (ref 43.0–77.0)
Platelets: 400 10*3/uL (ref 150.0–400.0)
RBC: 4.79 Mil/uL (ref 3.87–5.11)
RDW: 12.7 % (ref 11.5–15.5)
WBC: 7.7 10*3/uL (ref 4.0–10.5)

## 2024-01-16 LAB — COMPREHENSIVE METABOLIC PANEL WITH GFR
ALT: 10 U/L (ref 0–35)
AST: 22 U/L (ref 0–37)
Albumin: 5.1 g/dL (ref 3.5–5.2)
Alkaline Phosphatase: 92 U/L (ref 39–117)
BUN: 12 mg/dL (ref 6–23)
CO2: 26 meq/L (ref 19–32)
Calcium: 10.2 mg/dL (ref 8.4–10.5)
Chloride: 103 meq/L (ref 96–112)
Creatinine, Ser: 0.72 mg/dL (ref 0.40–1.20)
GFR: 81.6 mL/min (ref >60.00)
Glucose, Bld: 86 mg/dL (ref 70–99)
Potassium: 4.3 meq/L (ref 3.5–5.1)
Sodium: 142 meq/L (ref 135–145)
Total Bilirubin: 0.6 mg/dL (ref 0.2–1.2)
Total Protein: 8.4 g/dL — ABNORMAL HIGH (ref 6.0–8.3)

## 2024-01-16 LAB — VITAMIN D 25 HYDROXY (VIT D DEFICIENCY, FRACTURES): VITD: 28.57 ng/mL — ABNORMAL LOW (ref 30.00–100.00)

## 2024-01-16 LAB — TSH: TSH: 2.36 u[IU]/mL (ref 0.35–5.50)

## 2024-01-16 LAB — HEMOGLOBIN A1C: Hgb A1c MFr Bld: 5.7 % (ref 4.6–6.5)

## 2024-01-16 NOTE — Progress Notes (Signed)
 Subjective:    Patient ID: Melanie Lee, female    DOB: 30-Nov-1945, 78 y.o.   MRN: [id number]  Chief Complaint  Patient presents with   Follow-up    HPI Discussed the use of AI scribe software for clinical note transcription with the patient, who gave verbal consent to proceed.  History of Present Illness Melanie Lee is a 78 year old female with right hip arthritis and inflammation who presents with increasing hip pain.  She has been experiencing increasing pain in her right hip, which has a history of arthritis and inflammation. There have been no recent falls. Physical therapy has previously been beneficial in alleviating the pain, but she is not currently undergoing therapy. She typically receives therapy at [facility]. She takes hydrocodone and cyclobenzaprine for pain management.  She experiences urinary urgency, describing it as 'just can't make it to the bathroom,' indicating urgency without burning or incontinence. She is currently taking medication for this issue and is under the care of a urologist.  She takes rosuvastatin for cholesterol management. She received an RSV shot two months ago, which caused prolonged soreness in her arm. No issues with allergies this year despite it being a rough season for many. She uses Flonase for allergy management when needed.    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   S/P excision of bony prominence, right knee 04/23/2022    Past Surgical History:  Procedure Laterality Date   ABDOMINAL HYSTERECTOMY  1995   total for fibroids   BONE EXCISION Right 04/23/2022   Procedure: Excision of bony prominence Right total knee;  Surgeon: Durene Romans, MD;  Location: [facility];  Service: Orthopedics;  Laterality: Right;   BREAST BIOPSY  1995   benign    CATARACT EXTRACTION  2013 & 2015   Bilateral   KNEE SURGERY     Left Arthroscopy   right hip replacement      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/2016   Procedure: TOTAL KNEE ARTHROPLASTY;  Surgeon: Ranee Gosselin, MD;  Location: [facility];  Service: Orthopedics;  Laterality: Left;   TOTAL KNEE ARTHROPLASTY Right 12/26/2020   Procedure: TOTAL KNEE ARTHROPLASTY;  Surgeon: Durene Romans, MD;  Location: [facility];  Service: Orthopedics;  Laterality: Right;  70 mins    Family History  Problem Relation Age of Onset   Breast cancer Mother 45       Deceased   Heart disease Mother    Hypertension Mother    Hypertension Father        Deceased   Stroke Father    Hypertension Sister    Hypertension Brother    Heart defect Daughter    Hyperlipidemia Daughter    Hypertension Daughter    Vision loss Paternal Uncle    Stroke Brother     Social History   Socioeconomic History   Marital status: Single    Spouse name: Not on file   Number of children: Not on file   Years of education: Not on file   Highest education level: Not on file  Occupational History   Not on file  Tobacco Use   Smoking status: Never   Smokeless tobacco: Never  Vaping Use   Vaping status: Never Used  Substance and Sexual Activity   Alcohol use:  Never   Drug use: No   Sexual activity: Not Currently  Other Topics Concern   Not on file  Social History Narrative   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      Right handed   Social Drivers of Health   Financial Resource Strain: Low Risk  (01/31/2023)   Overall Financial Resource Strain (CARDIA)    Difficulty of Paying Living Expenses: Not hard at all  Food Insecurity: No Food Insecurity (01/31/2023)   Hunger Vital Sign    Worried About Running Out of Food in the Last Year: Never true    Ran Out of Food in the Last Year: Never true  Transportation Needs: No Transportation Needs (01/31/2023)   PRAPARE - Administrator, Civil Service (Medical): No    Lack of  Transportation (Non-Medical): No  Physical Activity: Inactive (01/31/2023)   Exercise Vital Sign    Days of Exercise per Week: 0 days    Minutes of Exercise per Session: 0 min  Stress: No Stress Concern Present (01/31/2023)   [employer] of Occupational Health - Occupational Stress Questionnaire    Feeling of Stress : Not at all  Social Connections: Moderately Integrated (01/31/2023)   Social Connection and Isolation Panel [NHANES]    Frequency of Communication with Friends and Family: More than three times a week    Frequency of Social Gatherings with Friends and Family: Once a week    Attends Religious Services: More than 4 times per year    Active Member of [employer] or Organizations: No    Attends Engineer, structural: More than 4 times per year    Marital Status: Widowed  Intimate Partner Violence: Not At Risk (01/31/2023)   Humiliation, Afraid, Rape, and Kick questionnaire    Fear of Current or Ex-Partner: No    Emotionally Abused: No    Physically Abused: No    Sexually Abused: No    Outpatient Medications Prior to Visit  Medication Sig Dispense Refill   conjugated estrogens (PREMARIN) vaginal cream Place 1 Applicatorful vaginally every Monday, Wednesday, and Friday.     cyclobenzaprine (FLEXERIL) 5 MG tablet TAKE 1/2-1 TABLET BY MOUTH 2 TIMES A DAY AS NEEDED FOR MUSCLE SPASMS 60 tablet 3   HYDROcodone-acetaminophen (NORCO) 7.5-325 MG tablet Take 1 tablet by mouth 3 (three) times daily as needed for severe pain (pain score 7-10). 90 tablet 0   Multiple Vitamins-Minerals (MULTIVITAMIN ADULTS 50+) TABS Take 1 tablet by mouth daily.      rosuvastatin (CRESTOR) 10 MG tablet Take 1 tablet (10 mg total) by mouth daily. 90 tablet 1   fluticasone (FLONASE) 50 MCG/ACT nasal spray Place 2 sprays into both nostrils daily. (Patient taking differently: Place 2 sprays into both nostrils daily as needed for allergies.) 16 g 1   No facility-administered medications prior to visit.     Allergies  Allergen Reactions   Tramadol Nausea Only and Other (See Comments)    Dizziness    Review of Systems  Constitutional:  Negative for fever and malaise/fatigue.  HENT:  Negative for congestion.   Eyes:  Negative for blurred vision.  Respiratory:  Negative for shortness of breath.   Cardiovascular:  Negative for chest pain, palpitations and leg swelling.  Gastrointestinal:  Negative for abdominal pain, blood in stool and nausea.  Genitourinary:  Positive for urgency. Negative for dysuria and frequency.  Musculoskeletal:  Positive for joint pain. Negative for falls.  Skin:  Negative for rash.  Neurological:  Negative for dizziness,  loss of consciousness and headaches.  Endo/Heme/Allergies:  Negative for environmental allergies.  Psychiatric/Behavioral:  Negative for depression. The patient is not nervous/anxious.        Objective:    Physical Exam Constitutional:      General: She is not in acute distress.    Appearance: Normal appearance. She is well-developed. She is not toxic-appearing.  HENT:     Head: Normocephalic and atraumatic.     Right Ear: External ear normal.     Left Ear: External ear normal.     Nose: Nose normal.  Eyes:     General:        Right eye: No discharge.        Left eye: No discharge.     Conjunctiva/sclera: Conjunctivae normal.  Neck:     Thyroid: No thyromegaly.  Cardiovascular:     Rate and Rhythm: Normal rate and regular rhythm.     Heart sounds: Normal heart sounds. No murmur heard. Pulmonary:     Effort: Pulmonary effort is normal. No respiratory distress.     Breath sounds: Normal breath sounds.  Abdominal:     General: Bowel sounds are normal.     Palpations: Abdomen is soft.     Tenderness: There is no abdominal tenderness. There is no guarding.  Musculoskeletal:        General: Normal range of motion.     Cervical back: Neck supple.  Lymphadenopathy:     Cervical: No cervical adenopathy.  Skin:    General: Skin is  warm and dry.  Neurological:     Mental Status: She is alert and oriented to person, place, and time.  Psychiatric:        Mood and Affect: Mood normal.        Behavior: Behavior normal.        Thought Content: Thought content normal.        Judgment: Judgment normal.     BP 124/78 (BP Location: Right Leg, Patient Position: Sitting, Cuff Size: Normal)   Pulse 83   Temp 99.3 F (37.4 C) (Oral)   Resp 18   Ht 5\' 4"  (1.626 m)   Wt 145 lb 3.2 oz (65.9 kg)   SpO2 96%   BMI 24.92 kg/m  Wt Readings from Last 3 Encounters:  01/15/24 145 lb 3.2 oz (65.9 kg)  09/16/23 149 lb (67.6 kg)  05/29/23 145 lb 12.8 oz (66.1 kg)    Diabetic Foot Exam - Simple   No data filed    Lab Results  Component Value Date   WBC 7.7 01/15/2024   HGB 14.0 01/15/2024   HCT 43.1 01/15/2024   PLT 400.0 01/15/2024   GLUCOSE 86 01/15/2024   CHOL 165 01/15/2024   TRIG 106.0 01/15/2024   HDL 52.70 01/15/2024   LDLCALC 91 01/15/2024   ALT 10 01/15/2024   AST 22 01/15/2024   NA 142 01/15/2024   K 4.3 01/15/2024   CL 103 01/15/2024   CREATININE 0.72 01/15/2024   BUN 12 01/15/2024   CO2 26 01/15/2024   TSH 2.36 01/15/2024   INR 1.03 04/19/2016   HGBA1C 5.7 01/15/2024    Lab Results  Component Value Date   TSH 2.36 01/15/2024   Lab Results  Component Value Date   WBC 7.7 01/15/2024   HGB 14.0 01/15/2024   HCT 43.1 01/15/2024   MCV 89.9 01/15/2024   PLT 400.0 01/15/2024   Lab Results  Component Value Date   NA 142 01/15/2024  K 4.3 01/15/2024   CHLORIDE 105 06/03/2016   CO2 26 01/15/2024   GLUCOSE 86 01/15/2024   BUN 12 01/15/2024   CREATININE 0.72 01/15/2024   BILITOT 0.6 01/15/2024   ALKPHOS 92 01/15/2024   AST 22 01/15/2024   ALT 10 01/15/2024   PROT 8.4 (H) 01/15/2024   ALBUMIN 5.1 01/15/2024   CALCIUM 10.2 01/15/2024   ANIONGAP 8 04/10/2022   EGFR >90 06/03/2016   GFR 81.60 01/15/2024   Lab Results  Component Value Date   CHOL 165 01/15/2024   Lab Results   Component Value Date   HDL 52.70 01/15/2024   Lab Results  Component Value Date   LDLCALC 91 01/15/2024   Lab Results  Component Value Date   TRIG 106.0 01/15/2024   Lab Results  Component Value Date   CHOLHDL 3 01/15/2024   Lab Results  Component Value Date   HGBA1C 5.7 01/15/2024       Assessment & Plan:  Hyperglycemia Assessment & Plan: hgba1c acceptable, minimize simple carbs. Increase exercise as tolerated.    Orders: -     Comprehensive metabolic panel with GFR -     Hemoglobin A1c -     TSH  Hyperlipidemia LDL goal <100 Assessment & Plan: Tolerating statin, encouraged heart healthy diet, avoid trans fats, minimize simple carbs and saturated fats. Increase exercise as tolerated  Orders: -     Lipid panel -     TSH  Vitamin D deficiency Assessment & Plan: ,=Supplement and monitor   Orders: -     VITAMIN D 25 Hydroxy (Vit-D Deficiency, Fractures)  Right hip pain -     Ambulatory referral to Physical Therapy -     CBC with Differential/Platelet  Hx of colonic polyp -     TSH  High risk medication use -     Drug Monitoring Panel [id number] , Urine; Future  Primary osteoarthritis of both knees -     Drug Monitoring Panel [id number] , Urine; Future  Other orders -     Fluticasone Propionate; Place 2 sprays into both nostrils daily as needed for allergies.  Dispense: 18.2 mL; Refill: 3    Assessment and Plan Assessment & Plan Right Hip Pain Chronic pain due to arthritis and inflammation, exacerbated recently. Physical therapy beneficial previously. - Order physical therapy referral to [facility] for right hip pain management.  Urinary Urgency Under care of urologist Dr. Trixie Deis.  Hyperlipidemia Discussed benefits of cholesterol medication for reducing inflammation, arterial plaque, dementia, and heart disease risk. Advised continuation of medication. - Continue rosuvastatin as prescribed.  Allergic Rhinitis No significant symptoms this  year, occasional headaches due to allergies. - Prescribe Fluticasone (Flonase) nasal spray as needed for allergy symptoms.  Vaccination Status Received RSV vaccine with prolonged arm soreness. Discussed Prevnar 20 for additional pneumococcal protection. - Consider Prevnar 20 vaccination at next visit.  Medication Refills Reviewed current medications and refills. Hydrocodone, cyclobenzaprine, and rosuvastatin refills confirmed. Fluticasone refill needed. - Refill Fluticasone nasal spray. - Ensure hydrocodone and cyclobenzaprine prescriptions are up to date.  Laboratory Tests Blood work due for monitoring vitamin D, blood glucose, renal function, thyroid, and cholesterol. - Order blood work including vitamin D, blood glucose, renal function, thyroid, and cholesterol levels.  General Health Maintenance Discussed importance of staying active and maintaining a healthy lifestyle for managing aging and health conditions. - Encourage regular physical activity and healthy lifestyle choices.  Follow-up Follow-up appointment scheduled to monitor health conditions and review lab  results. - Schedule follow-up appointment in four months.     Danise Edge, MD

## 2024-01-18 LAB — DM TEMPLATE

## 2024-01-18 LAB — DRUG MONITORING PANEL 376104, URINE
Amphetamines: NEGATIVE ng/mL (ref <500)
Barbiturates: NEGATIVE ng/mL (ref <300)
Benzodiazepines: NEGATIVE ng/mL (ref <100)
Cocaine Metabolite: NEGATIVE ng/mL (ref <150)
Codeine: NEGATIVE ng/mL (ref <50)
Desmethyltramadol: NEGATIVE ng/mL (ref <100)
Hydrocodone: 316 ng/mL — ABNORMAL HIGH (ref <50)
Hydromorphone: 279 ng/mL — ABNORMAL HIGH (ref <50)
Morphine: NEGATIVE ng/mL (ref <50)
Norhydrocodone: 271 ng/mL — ABNORMAL HIGH (ref <50)
Opiates: POSITIVE ng/mL — AB (ref <100)
Oxycodone: NEGATIVE ng/mL (ref <100)
Tramadol: NEGATIVE ng/mL (ref <100)

## 2024-02-02 DIAGNOSIS — M51369 Other intervertebral disc degeneration, lumbar region without mention of lumbar back pain or lower extremity pain: Secondary | ICD-10-CM | POA: Diagnosis not present

## 2024-02-02 DIAGNOSIS — Z7409 Other reduced mobility: Secondary | ICD-10-CM | POA: Diagnosis not present

## 2024-02-02 DIAGNOSIS — M25551 Pain in right hip: Secondary | ICD-10-CM | POA: Diagnosis not present

## 2024-02-02 DIAGNOSIS — R29898 Other symptoms and signs involving the musculoskeletal system: Secondary | ICD-10-CM | POA: Diagnosis not present

## 2024-02-11 ENCOUNTER — Other Ambulatory Visit: Payer: Self-pay | Admitting: Family Medicine

## 2024-02-11 NOTE — Telephone Encounter (Signed)
 Copied from CRM [id number]. Topic: Clinical - Medication Refill >> Feb 11, 2024  7:50 AM Ovid Blow wrote: Most Recent Primary Care Visit:  Provider: Randie Bustle A  Department: [facility]  Visit Type: OFFICE VISIT  Date: 01/15/2024  Medication: HYDROcodone -acetaminophen  (NORCO) 7.5-325 MG tablet  Has the patient contacted their pharmacy? Yes (Agent: If no, request that the patient contact the pharmacy for the refill. If patient does not wish to contact the pharmacy document the reason why and proceed with request.) (Agent: If yes, when and what did the pharmacy advise?)  Is this the correct pharmacy for this prescription? Yes If no, delete pharmacy and type the correct one.  This is the patient's preferred pharmacy:  [facility] [id number] - [place] - [address] [place] [address] Phone: [phone] Fax: [phone]   Has the prescription been filled recently? No  Is the patient out of the medication? No  Has the patient been seen for an appointment in the last year OR does the patient have an upcoming appointment? Yes  Can we respond through MyChart? No  Agent: Please be advised that Rx refills may take up to 3 business days. We ask that you follow-up with your pharmacy.

## 2024-02-13 NOTE — Telephone Encounter (Unsigned)
 Copied from CRM [id number]. Topic: Clinical - Medication Question >> Feb 13, 2024  2:00 PM Aisha D wrote: Reason for CRM: Patient's daughter is calling to check the status of her medication refill request for the HYDROcodone -acetaminophen  (NORCO) 7.5-325 MG tablet. I informed the daughter that the request is still pending. Daughter would like a call once the medication has been approved and sent to the pharmacy.

## 2024-02-13 NOTE — Telephone Encounter (Signed)
 Requesting: Norco 7.5-325 MG Contract: 01/15/24 UDS: 01/15/24 Last Visit: 01/15/2024 Next Visit: 05/18/2024 Last Refill: 01/13/24  Please Advise

## 2024-02-13 NOTE — Telephone Encounter (Deleted)
 Copied from CRM [id number]. Topic: Clinical - Medication Refill >> Feb 11, 2024  7:50 AM Melanie Lee wrote: Most Recent Primary Care Visit:  Provider: Randie Bustle A  Department: [facility]  Visit Type: OFFICE VISIT  Date: 01/15/2024  Medication: HYDROcodone -acetaminophen  (NORCO) 7.5-325 MG tablet  Has the patient contacted their pharmacy? Yes (Agent: If no, request that the patient contact the pharmacy for the refill. If patient does not wish to contact the pharmacy document the reason why and proceed with request.) (Agent: If yes, when and what did the pharmacy advise?)  Is this the correct pharmacy for this prescription? Yes If no, delete pharmacy and type the correct one.  This is the patient's preferred pharmacy:  [facility] [id number] - [place] - [address] [place] [address] Phone: [phone] Fax: [phone]   Has the prescription been filled recently? No  Is the patient out of the medication? No  Has the patient been seen for an appointment in the last year OR does the patient have an upcoming appointment? Yes  Can we respond through MyChart? No  Agent: Please be advised that Rx refills may take up to 3 business days. We ask that you follow-up with your pharmacy. >> Feb 13, 2024 10:10 AM Melanie Lee wrote: pt is now currently out of medication. Pt daughter will like it refill as soon as possible.

## 2024-02-15 ENCOUNTER — Other Ambulatory Visit: Payer: Self-pay | Admitting: Family Medicine

## 2024-02-15 MED ORDER — HYDROCODONE-ACETAMINOPHEN 7.5-325 MG PO TABS: 1.0000 | ORAL_TABLET | Freq: Three times a day (TID) | ORAL | 0 refills | Status: DC | PRN

## 2024-02-18 DIAGNOSIS — R29898 Other symptoms and signs involving the musculoskeletal system: Secondary | ICD-10-CM | POA: Diagnosis not present

## 2024-02-18 DIAGNOSIS — M51369 Other intervertebral disc degeneration, lumbar region without mention of lumbar back pain or lower extremity pain: Secondary | ICD-10-CM | POA: Diagnosis not present

## 2024-02-18 DIAGNOSIS — Z7409 Other reduced mobility: Secondary | ICD-10-CM | POA: Diagnosis not present

## 2024-02-18 DIAGNOSIS — M25551 Pain in right hip: Secondary | ICD-10-CM | POA: Diagnosis not present

## 2024-02-23 ENCOUNTER — Ambulatory Visit

## 2024-02-23 VITALS — Ht 64.0 in | Wt 145.0 lb

## 2024-02-23 DIAGNOSIS — Z0000 Encounter for general adult medical examination without abnormal findings: Secondary | ICD-10-CM

## 2024-02-23 DIAGNOSIS — Z5986 Financial insecurity: Secondary | ICD-10-CM

## 2024-02-23 NOTE — Progress Notes (Unsigned)
 Subjective:   Melanie Lee is a 78 y.o. who presents for a Medicare Wellness preventive visit.  As a reminder, Annual Wellness Visits don't include a physical exam, and some assessments may be limited, especially if this visit is performed virtually. We may recommend an in-person visit if needed.  Visit Complete: Virtual I connected with  Batool Kring on 02/23/24 by a audio enabled telemedicine application and verified that I am speaking with the correct person using two identifiers.  Patient Location: Home  Provider Location: Office/Clinic  I discussed the limitations of evaluation and management by telemedicine. The patient expressed understanding and agreed to proceed.  Vital Signs: Because this visit was a virtual/telehealth visit, some criteria may be missing or patient reported. Any vitals not documented were not able to be obtained and vitals that have been documented are patient reported.  VideoDeclined- This patient declined Librarian, academic. Therefore the visit was completed with audio only.  Persons Participating in Visit: Patient.  AWV Questionnaire: No: Patient Medicare AWV questionnaire was not completed prior to this visit.  Cardiac Risk Factors include: advanced age (>95men, >41 women);dyslipidemia     Objective:     Today's Vitals   02/23/24 1602  Weight: 145 lb (65.8 kg)  Height: 5\' 4"  (1.626 m)   Body mass index is 24.89 kg/m.     02/23/2024    4:29 PM 01/31/2023    2:30 PM 04/10/2022   11:03 AM 09/18/2021   12:53 PM 03/06/2021    3:00 PM 12/26/2020   10:10 AM 12/26/2020    5:50 AM  Advanced Directives  Does Patient Have a Medical Advance Directive? No No No No No No No  Would patient like information on creating a medical advance directive? Yes (MAU/Ambulatory/Procedural Areas - Information given) No - Patient declined   Yes (MAU/Ambulatory/Procedural Areas - Information given) No - Patient declined No - Patient  declined    Current Medications (verified) Outpatient Encounter Medications as of 02/23/2024  Medication Sig   cholecalciferol (VITAMIN D3) 25 MCG (1000 UNIT) tablet Take 1,000 Units by mouth daily.   conjugated estrogens (PREMARIN) vaginal cream Place 1 Applicatorful vaginally every Monday, Wednesday, and Friday.   cyclobenzaprine  (FLEXERIL ) 5 MG tablet TAKE 1/2-1 TABLET BY MOUTH 2 TIMES A DAY AS NEEDED FOR MUSCLE SPASMS   fluticasone  (FLONASE ) 50 MCG/ACT nasal spray Place 2 sprays into both nostrils daily as needed for allergies.   HYDROcodone -acetaminophen  (NORCO) 7.5-325 MG tablet Take 1 tablet by mouth 3 (three) times daily as needed for severe pain (pain score 7-10).   Multiple Vitamins-Minerals (MULTIVITAMIN ADULTS 50+) TABS Take 1 tablet by mouth daily.    rosuvastatin  (CRESTOR ) 10 MG tablet Take 1 tablet (10 mg total) by mouth daily.   Trospium Chloride 60 MG CP24 Take 60 mg by mouth daily.   No facility-administered encounter medications on file as of 02/23/2024.    Allergies (verified) Tramadol    History: 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   S/P excision of bony prominence, right knee 04/23/2022   Past Surgical History:  Procedure Laterality Date   ABDOMINAL HYSTERECTOMY  1995   total for fibroids   BONE EXCISION Right 04/23/2022   Procedure: Excision of bony prominence Right total knee;  Surgeon: Claiborne Crew, MD;  Location: [facility];  Service: Orthopedics;  Laterality: Right;   BREAST BIOPSY  1995   benign   CATARACT EXTRACTION  2013 & 2015   Bilateral   KNEE SURGERY     Left Arthroscopy   left hip replacement Left 2022   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/2016   Procedure: TOTAL KNEE ARTHROPLASTY;  Surgeon: Hazle Lites, MD;  Location: [facility];  Service: Orthopedics;  Laterality:  Left;   TOTAL KNEE ARTHROPLASTY Right 12/26/2020   Procedure: TOTAL KNEE ARTHROPLASTY;  Surgeon: Claiborne Crew, MD;  Location: [facility];  Service: Orthopedics;  Laterality: Right;  70 mins   Family History  Problem Relation Age of Onset   Breast cancer Mother 23       Deceased   Heart disease Mother    Hypertension Mother    Hypertension Father        Deceased   Stroke Father    Hypertension Sister    Hypertension Brother    Heart defect Daughter    Hyperlipidemia Daughter    Hypertension Daughter    Vision loss Paternal Uncle    Stroke Brother    Social History   Socioeconomic History   Marital status: Single    Spouse name: Not on file   Number of children: Not on file   Years of education: Not on file   Highest education level: Not on file  Occupational History   Not on file  Tobacco Use   Smoking status: Never   Smokeless tobacco: Never  Vaping Use   Vaping status: Never Used  Substance and Sexual Activity   Alcohol use: Never   Drug use: No   Sexual activity: Not Currently  Other Topics Concern   Not on file  Social History Narrative   Retired Programme researcher, broadcasting/film/video at [employer].      Right handed   Social Drivers of Health   Financial Resource Strain: High Risk (02/23/2024)   Overall Financial Resource Strain (CARDIA)    Difficulty of Paying Living Expenses: Hard  Food Insecurity: Food Insecurity Present (02/23/2024)   Hunger Vital Sign    Worried About Running Out of Food in the Last Year: Sometimes true    Ran Out of Food in the Last Year: Sometimes true  Transportation Needs: No Transportation Needs (02/23/2024)   PRAPARE - Administrator, Civil Service (Medical): No    Lack of Transportation (Non-Medical): No  Physical Activity: Inactive (02/23/2024)   Exercise Vital Sign    Days of Exercise per Week: 0 days    Minutes of Exercise per Session: 0 min  Stress: No Stress Concern Present (02/23/2024)   [employer] of Occupational Health -  Occupational Stress Questionnaire    Feeling of Stress : Only a little  Social Connections: Socially Isolated (02/23/2024)   Social Connection and Isolation Panel [NHANES]    Frequency of Communication with Friends and Family: Three times a week    Frequency of Social Gatherings with Friends and Family: Twice a week    Attends Religious Services: Never    Database administrator or Organizations: No    Attends Engineer, structural: Never    Marital Status: Never married    Tobacco Counseling Counseling given: Not Answered    Clinical Intake:  Pre-visit preparation completed: Yes  Pain : No/denies pain (has right hip pain when walking > than 1 yr)     BMI - recorded: 24.89 Nutritional Risks: Unintentional weight loss, Other (Comment) (doesn't eat as  much as she used to) Diabetes: No  Lab Results  Component Value Date   HGBA1C 5.7 01/15/2024   HGBA1C 5.8 09/16/2023   HGBA1C 5.8 02/18/2023     How often do you need to have someone help you when you read instructions, pamphlets, or other written materials from your doctor or pharmacy?: 1 - Never What is the last grade level you completed in school?: 12  Interpreter Needed?: No  Information entered by :: Susa Engman, CMA   Activities of Daily Living     02/23/2024    4:07 PM  In your present state of health, do you have any difficulty performing the following activities:  Hearing? 0  Vision? 0  Difficulty concentrating or making decisions? 1  Comment name recall  Walking or climbing stairs? 0  Dressing or bathing? 0  Doing errands, shopping? 0  Preparing Food and eating ? N  Using the Toilet? N  In the past six months, have you accidently leaked urine? Y  Comment has overactive bladder  Do you have problems with loss of bowel control? N  Managing your Medications? N  Managing your Finances? N  Housekeeping or managing your Housekeeping? N    Patient Care Team: [name], MD as PCP -  General (Family Medicine) Corinne Dicker., MD as Referring Physician (Gastroenterology)  Indicate any recent Medical Services you may have received from other than [facility] providers in the past year (date may be approximate).     Assessment:    This is a routine wellness examination for Shallyn.  Hearing/Vision screen Hearing Screening - Comments:: Denies difficulty Vision Screening - Comments:: Has glasses and sees good with them.  Last eye exam January 2025.   Goals Addressed   None    Depression Screen     02/23/2024    4:23 PM 09/16/2023   10:30 AM 05/29/2023   10:50 AM 01/31/2023    2:57 PM 01/29/2022    1:07 PM 08/21/2021    1:30 PM 10/20/2020   10:42 AM  PHQ 2/9 Scores  PHQ - 2 Score 0 2 0 0 2 0 0  PHQ- 9 Score 6 4   2  2     Fall Risk     02/23/2024    4:32 PM 09/16/2023   10:30 AM 05/29/2023   10:50 AM 02/18/2023    1:14 PM 02/18/2023    1:12 PM  Fall Risk   Falls in the past year? 1 0 0 0 0  Number falls in past yr: 0 0 0 0 0  Injury with Fall? 0 0 0 0 0  Risk for fall due to : History of fall(s);Impaired mobility No Fall Risks     Follow up  Falls evaluation completed Falls evaluation completed Falls evaluation completed Falls evaluation completed    MEDICARE RISK AT HOME:  Medicare Risk at Home Any stairs in or around the home?: Yes If so, are there any without handrails?: No Home free of loose throw rugs in walkways, pet beds, electrical cords, etc?: No Adequate lighting in your home to reduce risk of falls?: Yes Life alert?: No Use of a cane, walker or w/c?: Yes (cane) Grab bars in the bathroom?: Yes Shower chair or bench in shower?: Yes Elevated toilet seat or a handicapped toilet?: Yes   Cognitive Function: 6CIT completed        02/23/2024    4:33 PM 01/31/2023    2:35 PM 01/29/2022    1:12 PM  6CIT Screen  What Year? 0 points 0 points 0 points  What month? 0 points 0 points 0 points  What time? 0 points 0 points 0 points  Count back from 20 0 points  0 points 0 points  Months in reverse 0 points 0 points 4 points  Repeat phrase 4 points 10 points 10 points  Total Score 4 points 10 points 14 points    Immunizations Immunization History  Administered Date(s) Administered   Fluad Quad(high Dose 65+) 07/31/2021   Influenza, High Dose Seasonal PF 09/14/2018, 07/25/2020   Influenza,inj,Quad PF,6+ Mos 06/24/2016   Influenza,inj,quad, With Preservative 10/20/2018   Influenza-Unspecified 06/21/2023   Moderna Sars-Covid-2 Vaccination 11/22/2019, 12/22/2019, 10/16/2020, 04/03/2021   Pneumococcal Conjugate-13 10/28/2014   Pneumococcal Polysaccharide-23 01/01/2016   Respiratory Syncytial Virus Vaccine,Recomb Aduvanted(Arexvy) 09/05/2023   Tdap 04/01/2016   Zoster Recombinant(Shingrix) 11/10/2020   Zoster, Live 03/21/2015    Screening Tests Health Maintenance  Topic Date Due   Zoster Vaccines- Shingrix (2 of 2) 01/05/2021   COVID-19 Vaccine (5 - 2024-25 season) 06/15/2023   INFLUENZA VACCINE  05/14/2024   MAMMOGRAM  12/10/2024   Medicare Annual Wellness (AWV)  02/22/2025   DTaP/Tdap/Td (2 - Td or Tdap) 04/01/2026   Colonoscopy  08/25/2029   Pneumonia Vaccine 32+ Years old  Completed   DEXA SCAN  Completed   Hepatitis C Screening  Completed   HPV VACCINES  Aged Out   Meningococcal B Vaccine  Aged Out    Health Maintenance  Health Maintenance Due  Topic Date Due   Zoster Vaccines- Shingrix (2 of 2) 01/05/2021   COVID-19 Vaccine (5 - 2024-25 season) 06/15/2023   Health Maintenance Items Addressed: due for shingles and COVID vaccines.  Additional Screening:  Vision Screening: Recommended annual ophthalmology exams for early detection of glaucoma and other disorders of the eye.  Dental Screening: Recommended annual dental exams for proper oral hygiene  Community Resource Referral / Chronic Care Management: CRR required this visit?  Yes   CCM required this visit?  No   Plan:    I have personally reviewed and noted the  following in the patient's chart:   Medical and social history Use of alcohol, tobacco or illicit drugs  Current medications and supplements including opioid prescriptions. Patient is currently taking opioid prescriptions. Information provided to patient regarding non-opioid alternatives. Patient advised to discuss non-opioid treatment plan with their provider. Functional ability and status Nutritional status Physical activity Advanced directives List of other physicians Hospitalizations, surgeries, and ER visits in previous 12 months Vitals Screenings to include cognitive, depression, and falls Referrals and appointments  In addition, I have reviewed and discussed with patient certain preventive protocols, quality metrics, and best practice recommendations. A written personalized care plan for preventive services as well as general preventive health recommendations were provided to patient.   Susa Engman, CMA   02/23/24   After Visit Summary: (Declined) Due to this being a telephonic visit, with patients personalized plan was offered to patient but patient Declined AVS at this time   Notes: Nothing significant to report at this time.

## 2024-02-24 DIAGNOSIS — Z7409 Other reduced mobility: Secondary | ICD-10-CM | POA: Diagnosis not present

## 2024-02-24 DIAGNOSIS — R29898 Other symptoms and signs involving the musculoskeletal system: Secondary | ICD-10-CM | POA: Diagnosis not present

## 2024-02-24 DIAGNOSIS — M51369 Other intervertebral disc degeneration, lumbar region without mention of lumbar back pain or lower extremity pain: Secondary | ICD-10-CM | POA: Diagnosis not present

## 2024-02-24 DIAGNOSIS — M25551 Pain in right hip: Secondary | ICD-10-CM | POA: Diagnosis not present

## 2024-02-26 DIAGNOSIS — M51369 Other intervertebral disc degeneration, lumbar region without mention of lumbar back pain or lower extremity pain: Secondary | ICD-10-CM | POA: Diagnosis not present

## 2024-02-26 DIAGNOSIS — M25551 Pain in right hip: Secondary | ICD-10-CM | POA: Diagnosis not present

## 2024-02-26 DIAGNOSIS — R29898 Other symptoms and signs involving the musculoskeletal system: Secondary | ICD-10-CM | POA: Diagnosis not present

## 2024-02-26 DIAGNOSIS — Z7409 Other reduced mobility: Secondary | ICD-10-CM | POA: Diagnosis not present

## 2024-02-26 NOTE — Patient Instructions (Addendum)
 Ms. Eudy , Thank you for taking time out of your busy schedule to complete your Annual Wellness Visit with me. I enjoyed our conversation and look forward to speaking with you again next year. I, as well as your care team,  appreciate your ongoing commitment to your health goals. Please review the following plan we discussed and let me know if I can assist you in the future. Your Game plan/ To Do List    Referrals: Please reach out to [facility] at [phone] if you are not contacted about your community resource referral.   Follow up Visits: Next Medicare AWV with our clinical staff: 02/23/25   Have you seen your provider in the last 6 months (3 months if uncontrolled diabetes)? Yes Next Office Visit with your provider: 05/18/24  Clinician Recommendations:  Aim for 30 minutes of exercise or brisk walking, 6-8 glasses of water , and 5 servings of fruits and vegetables each day.       This is a list of the screening recommended for you and due dates:  Health Maintenance  Topic Date Due   Zoster (Shingles) Vaccine (2 of 2) 01/05/2021   COVID-19 Vaccine (5 - 2024-25 season) 06/15/2023   Flu Shot  05/14/2024   Mammogram  12/10/2024   Medicare Annual Wellness Visit  02/22/2025   DTaP/Tdap/Td vaccine (2 - Td or Tdap) 04/01/2026   Colon Cancer Screening  08/25/2029   Pneumonia Vaccine  Completed   DEXA scan (bone density measurement)  Completed   Hepatitis C Screening  Completed   HPV Vaccine  Aged Out   Meningitis B Vaccine  Aged Out   Please check with your pharmacy if you decide to get your remaining Shingles vaccine as well as any updated COVID vaccines in the future.  Advanced directives: (Provided) Advance directive discussed with you today. I have provided a copy for you to complete at home and have notarized. Once this is complete, please bring a copy in to our office so we can scan it into your chart.  Advance Care Planning is important because it:  [x]  Makes sure  you receive the medical care that is consistent with your values, goals, and preferences  [x]  It provides guidance to your family and loved ones and reduces their decisional burden about whether or not they are making the right decisions based on your wishes.  Follow the link provided in your after visit summary or read over the paperwork we have mailed to you to help you started getting your Advance Directives in place. If you need assistance in completing these, please reach out to us  so that we can help you!  See attachments for Preventive Care and Fall Prevention Tips.

## 2024-03-03 ENCOUNTER — Telehealth: Payer: Self-pay

## 2024-03-03 NOTE — Progress Notes (Signed)
 Complex Care Management Note  Care Guide Note 03/03/2024 Name: Melanie Lee MRN: [id number] DOB: 1945/12/12  Melanie Lee is a 78 y.o. year old female who sees [name], MD for primary care. I reached out to Alys Schuchard by phone today to offer complex care management services.  [name] was given information about Complex Care Management services today including:   The Complex Care Management services include support from the care team which includes your Nurse Care Manager, Clinical Social Worker, or Pharmacist.  The Complex Care Management team is here to help remove barriers to the health concerns and goals most important to you. Complex Care Management services are voluntary, and the patient may decline or stop services at any time by request to their care team member.   Complex Care Management Consent Status: Patient agreed to services and verbal consent obtained.   Follow up plan:  Telephone appointment with complex care management team member scheduled for:  03/10/24 & 03/12/24.  Encounter Outcome:  Patient Scheduled  [facility]  [facility], [facility] Health Care Management Assistant Direct Dial: [phone]  Fax: [phone]

## 2024-03-04 DIAGNOSIS — Z7409 Other reduced mobility: Secondary | ICD-10-CM | POA: Diagnosis not present

## 2024-03-04 DIAGNOSIS — M51369 Other intervertebral disc degeneration, lumbar region without mention of lumbar back pain or lower extremity pain: Secondary | ICD-10-CM | POA: Diagnosis not present

## 2024-03-04 DIAGNOSIS — R29898 Other symptoms and signs involving the musculoskeletal system: Secondary | ICD-10-CM | POA: Diagnosis not present

## 2024-03-04 DIAGNOSIS — M25551 Pain in right hip: Secondary | ICD-10-CM | POA: Diagnosis not present

## 2024-03-09 ENCOUNTER — Other Ambulatory Visit: Payer: Self-pay | Admitting: Family Medicine

## 2024-03-09 DIAGNOSIS — R29898 Other symptoms and signs involving the musculoskeletal system: Secondary | ICD-10-CM | POA: Diagnosis not present

## 2024-03-09 DIAGNOSIS — M51369 Other intervertebral disc degeneration, lumbar region without mention of lumbar back pain or lower extremity pain: Secondary | ICD-10-CM | POA: Diagnosis not present

## 2024-03-09 DIAGNOSIS — Z7409 Other reduced mobility: Secondary | ICD-10-CM | POA: Diagnosis not present

## 2024-03-09 DIAGNOSIS — M25551 Pain in right hip: Secondary | ICD-10-CM | POA: Diagnosis not present

## 2024-03-09 NOTE — Telephone Encounter (Unsigned)
 Copied from CRM [id number]. Topic: Clinical - Medication Refill >> Mar 09, 2024  3:51 PM [place] M wrote: Medication: HYDROcodone -acetaminophen  (NORCO) 7.5-325 MG tablet  Has the patient contacted their pharmacy? Yes (Agent: If no, request that the patient contact the pharmacy for the refill. If patient does not wish to contact the pharmacy document the reason why and proceed with request.) (Agent: If yes, when and what did the pharmacy advise?)  This is the patient's preferred pharmacy:  [facility] [id number] - [place], [place] - [address] [place] [address] Phone: [phone] Fax: [phone]  Is this the correct pharmacy for this prescription? Yes If no, delete pharmacy and type the correct one.   Has the prescription been filled recently? Yes  Is the patient out of the medication? Yes  Has the patient been seen for an appointment in the last year OR does the patient have an upcoming appointment? Yes  Can we respond through MyChart? Yes  Agent: Please be advised that Rx refills may take up to 3 business days. We ask that you follow-up with your pharmacy.

## 2024-03-10 ENCOUNTER — Ambulatory Visit (INDEPENDENT_AMBULATORY_CARE_PROVIDER_SITE_OTHER): Admitting: Pharmacist

## 2024-03-10 DIAGNOSIS — Z5986 Financial insecurity: Secondary | ICD-10-CM

## 2024-03-10 MED ORDER — HYDROCODONE-ACETAMINOPHEN 7.5-325 MG PO TABS: 1.0000 | ORAL_TABLET | Freq: Three times a day (TID) | ORAL | 0 refills | Status: DC | PRN

## 2024-03-10 NOTE — Progress Notes (Addendum)
 03/10/2024 Name: Melanie Lee MRN: [id number] DOB: 11-Mar-1946  Chief Complaint  Patient presents with   Medication Management    Melanie Lee is a 78 y.o. year old female who presented for a telephone visit.   They were referred to the pharmacist by their PCP for assistance in managing medication access and complex medication management.    Subjective:  Care Team: Primary Care Provider: [name], MD ; Next Scheduled Visit: 05/18/2024  Medication Access/Adherence  Current Pharmacy:  [facility] [id number] - [place], [place] - [address] [place] [address] Phone: [phone] Fax: [phone]   Patient reports affordability concerns with their medications: Yes  Patient reports access/transportation concerns to their pharmacy: No  Patient reports adherence concerns with their medications:  Yes  - due to cost  [name] states that she has not been able to afford Myrbetriq or trospium. She has taken Gemtesa in the past and it was effective but cost increased to $100 / month and she stopped due to cost.  She also reports that cost of estrogen / Premarin cream is $40 cyclobenzaprine  cost per #60 is around $10 Cost of fluticasone  NS and rosuvastatin  are $0     Objective:  Lab Results  Component Value Date   HGBA1C 5.7 01/15/2024    Lab Results  Component Value Date   CREATININE 0.72 01/15/2024   BUN 12 01/15/2024   NA 142 01/15/2024   K 4.3 01/15/2024   CL 103 01/15/2024   CO2 26 01/15/2024    Lab Results  Component Value Date   CHOL 165 01/15/2024   HDL 52.70 01/15/2024   LDLCALC 91 01/15/2024   TRIG 106.0 01/15/2024   CHOLHDL 3 01/15/2024    Medications Reviewed Today     Reviewed by Cecilie Coffee, RPH-CPP (Pharmacist) on 03/10/24 at 1433  Med List Status: <None>   Medication Order Taking? Sig Documenting Provider Last Dose Status Informant  cholecalciferol (VITAMIN D3) 25 MCG (1000 UNIT)  tablet [id number] Yes Take 1,000 Units by mouth daily. [provider] Taking Active   conjugated estrogens (PREMARIN) vaginal cream [id number] Yes Place 1 Applicatorful vaginally every Monday, Wednesday, and Friday. [provider] Taking Active Family Member  cyclobenzaprine  (FLEXERIL ) 5 MG tablet [id number] Yes TAKE 1/2-1 TABLET BY MOUTH 2 TIMES A DAY AS NEEDED FOR MUSCLE SPASMS [name], MD Taking Active   fluticasone  (FLONASE ) 50 MCG/ACT nasal spray [id number] Yes Place 2 sprays into both nostrils daily as needed for allergies. [name], MD Taking Active   HYDROcodone -acetaminophen  [facility]) 7.5-325 MG tablet [id number] Yes Take 1 tablet by mouth 3 (three) times daily as needed for severe pain (pain score 7-10). Webb, Padonda B, FNP Taking Active   Multiple Vitamins-Minerals (MULTIVITAMIN ADULTS 50+) TABS [id number] Yes Take 1 tablet by mouth daily.  [provider] Taking Active Family Member  rosuvastatin  (CRESTOR ) 10 MG tablet [id number] Yes Take 1 tablet (10 mg total) by mouth daily. [name], MD Taking Active   Trospium Chloride 60 MG CP24 [id number] No Take 60 mg by mouth daily.  Patient not taking: Reported on 03/10/2024   [provider] Not Taking Active               Assessment/Plan:   Medication Cost / Access:  - Reviewed patient's 2025 [facility] formulary. She has a $255 dedutible for any medications that are tier 3 or above. Therefore first  refill for Myrbetriq, trospium would be $255 + $47 copay or $297, then would be $47 copay. Gemtesa cost is $100 copay - so first fill would be $355.  - Checked cyclobenzaprine  cost - per patient is $10 per month. She could get cyclobenzaprine  for $5 / 30 days at the [facility] pharmacy. Patient is considering for next refill.  - Assisted patient with applying for Medicare Extra Help / LIS. If approved her cost for generics would be $0 to $5 and tier 3 or higher meds $12.15.  Usually takes about 3 weeks to received decision.  - Patient to discussed lower cost alternatives to [facility] with urologist. Oxybutynin is only $5 but is has most side effects like dry mouth and more likely to affect cognition.  - Reminded patient to use her quarterly over-the-counter allowance from [facility].  - follow up planned in 3 weeks.    Cecilie Coffee, PharmD Clinical Pharmacist [facility]

## 2024-03-10 NOTE — Telephone Encounter (Signed)
 Last Fill: 02/15/24 90 tabs/0 RF  Last OV: 01/15/24 Next OV: 05/18/24  Routing to provider for review/authorization.

## 2024-03-10 NOTE — Telephone Encounter (Signed)
 Requesting: hydrocodone  7.5-325mg   Contract: 01/15/24 UDS: 01/15/24 Last Visit: 01/15/24 Next Visit: 05/18/24 Last Refill: 02/15/24 #90 and 0RF   Please Advise

## 2024-03-11 DIAGNOSIS — Z7409 Other reduced mobility: Secondary | ICD-10-CM | POA: Diagnosis not present

## 2024-03-11 DIAGNOSIS — M25551 Pain in right hip: Secondary | ICD-10-CM | POA: Diagnosis not present

## 2024-03-11 DIAGNOSIS — M51369 Other intervertebral disc degeneration, lumbar region without mention of lumbar back pain or lower extremity pain: Secondary | ICD-10-CM | POA: Diagnosis not present

## 2024-03-11 DIAGNOSIS — R29898 Other symptoms and signs involving the musculoskeletal system: Secondary | ICD-10-CM | POA: Diagnosis not present

## 2024-03-12 ENCOUNTER — Other Ambulatory Visit: Payer: Self-pay | Admitting: Licensed Clinical Social Worker

## 2024-03-12 NOTE — Patient Outreach (Signed)
 Complex Care Management   Visit Note  03/12/2024  Name:  Melanie Lee MRN: [id number] DOB: 03/10/1946  Situation: Referral received for Complex Care Management related to SDOH Barriers:  Financial Resource Strain I obtained verbal consent from Patient.  Visit completed with Arthea Bilis  on the phone. Pt inquired about dental providers, cell phone services and transportation.  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   S/P excision of bony prominence, right knee 04/23/2022    Assessment: Patient Reported Symptoms:  Cognitive Cognitive Status: Able to follow simple commands, Alert and oriented to person, place, and time   Health Maintenance Behaviors: None  Neurological Neurological Review of Symptoms: No symptoms reported    HEENT HEENT Symptoms Reported: No symptoms reported      Cardiovascular Cardiovascular Symptoms Reported: No symptoms reported Cardiovascular Conditions: High blood cholesterol  Respiratory Respiratory Symptoms Reported: No symptoms reported    Endocrine Patient reports the following symptoms related to hypoglycemia or hyperglycemia : No symptoms reported Is patient diabetic?: No    Gastrointestinal Gastrointestinal Symptoms Reported: No symptoms reported   Nutrition Risk Screen (CP): No indicators present  Genitourinary Genitourinary Symptoms Reported: Other Other Genitourinary Symptoms: over active bladder    Integumentary Integumentary Symptoms Reported: No symptoms reported    Musculoskeletal   Musculoskeletal Conditions: Other Other Musculoskeletal Conditions: arthritis Falls in the past year?: No Number of falls in past year: 1 or less Was there an injury with Fall?: No Fall Risk Category Calculator: 0 Patient Fall Risk Level: Low Fall Risk Patient at  Risk for Falls Due to:  (pt reports no recent falls)  Psychosocial Psychosocial Symptoms Reported: No symptoms reported     Do you feel physically threatened by others?: No      02/23/2024    4:23 PM  Depression screen PHQ 2/9  Decreased Interest 0  Down, Depressed, Hopeless 0  PHQ - 2 Score 0  Altered sleeping 3  Tired, decreased energy 3  Change in appetite 0  Feeling bad or failure about yourself  0  Trouble concentrating 0  Moving slowly or fidgety/restless 0  Suicidal thoughts 0  PHQ-9 Score 6  Difficult doing work/chores Very difficult    There were no vitals filed for this visit.  Medications Reviewed Today     Reviewed by Fletcher Humble, LCSW (Social Worker) on 03/12/24 at 1734  Med List Status: <None>   Medication Order Taking? Sig Documenting Provider Last Dose Status Informant  cholecalciferol (VITAMIN D3) 25 MCG (1000 UNIT) tablet [id number] Yes Take 1,000 Units by mouth daily. [provider] Taking Active   conjugated estrogens (PREMARIN) vaginal cream [id number] Yes Place 1 Applicatorful vaginally every Monday, Wednesday, and Friday. [provider] Taking Active Family Member  cyclobenzaprine  (FLEXERIL ) 5 MG tablet [id number]  TAKE 1/2-1 TABLET BY MOUTH 2 TIMES A DAY AS NEEDED FOR MUSCLE SPASMS [name], MD  Active   fluticasone  (FLONASE ) 50 MCG/ACT nasal spray [id number] Yes Place 2 sprays into both nostrils daily as needed for allergies. [name], MD Taking Active   HYDROcodone -acetaminophen  [facility]) 7.5-325 MG tablet [id number] Yes Take 1 tablet by mouth 3 (three) times daily as needed for severe pain (pain score 7-10). Webb, Padonda B, FNP Taking Active   Multiple Vitamins-Minerals (MULTIVITAMIN ADULTS 50+)  TABS [id number] Yes Take 1 tablet by mouth daily.  [provider] Taking Active Family Member  rosuvastatin  (CRESTOR ) 10 MG tablet [id number]  Take 1 tablet (10 mg total) by mouth daily. [name], MD  Active    Trospium Chloride 60 MG CP24 [id number]  Take 60 mg by mouth daily.  Patient not taking: Reported on 03/10/2024   [provider]  Active             Recommendation:   Pt encouraged to contact [facility] provider when scheduling appts for assistance with transportation. LCSW A Sharry Deem completed [facility] transportation and submitted on pt behalf. Encouraged pt to apply for discounted cell phone service with [email or web address]. Pt given contact information for dental providers that accept her insurance.   Follow Up Plan:   Closing From:  Complex Care Management  Fletcher Humble MSW, LCSW Licensed Clinical Social Worker  [facility], Population Health Direct Dial: [phone]  Fax: [phone]

## 2024-03-12 NOTE — Patient Instructions (Signed)
 Visit Information  Thank you for taking time to visit with me today. Please don't hesitate to contact me if I can be of assistance to you before our next scheduled appointment.  Your next care management appointment is no further scheduled appointments.    Closing From: Complex Care Management.  Please call the care guide team at [phone] if you need to cancel, schedule, or reschedule an appointment.   Please call 911 if you are experiencing a Mental Health or Behavioral Health Crisis or need someone to talk to.  Fletcher Humble MSW, LCSW Licensed Clinical Social Worker  [facility], Population Health Direct Dial: [phone]  Fax: [phone]

## 2024-03-18 DIAGNOSIS — R29898 Other symptoms and signs involving the musculoskeletal system: Secondary | ICD-10-CM | POA: Diagnosis not present

## 2024-03-18 DIAGNOSIS — M25551 Pain in right hip: Secondary | ICD-10-CM | POA: Diagnosis not present

## 2024-03-18 DIAGNOSIS — Z7409 Other reduced mobility: Secondary | ICD-10-CM | POA: Diagnosis not present

## 2024-03-18 DIAGNOSIS — M51369 Other intervertebral disc degeneration, lumbar region without mention of lumbar back pain or lower extremity pain: Secondary | ICD-10-CM | POA: Diagnosis not present

## 2024-03-31 ENCOUNTER — Ambulatory Visit (INDEPENDENT_AMBULATORY_CARE_PROVIDER_SITE_OTHER): Admitting: Pharmacist

## 2024-03-31 DIAGNOSIS — Z5986 Financial insecurity: Secondary | ICD-10-CM

## 2024-03-31 DIAGNOSIS — Z79899 Other long term (current) drug therapy: Secondary | ICD-10-CM

## 2024-03-31 NOTE — Progress Notes (Signed)
   03/31/2024 Name: Melanie Lee MRN: [id number] DOB: 06/30/1946  Chief Complaint  Patient presents with   Medication Management    Melanie Lee is a 78 y.o. year old female who presented for a telephone visit.   They were referred to the pharmacist by their PCP for assistance in managing medication access and complex medication management.    Subjective:  Care Team: Primary Care Provider: [name], MD ; Next Scheduled Visit: 05/18/2024  Medication Access/Adherence  Current Pharmacy:  [id number] - [place] - [address] [place] [address] Phone: [phone] Fax: [phone]   Patient reports affordability concerns with their medications: Yes  - We applied for LIS at our visit 03/10/2024. Patient reports she has received a letter that she was approved for LIS but she has not filled any prescriptions yet.  Patient reports access/transportation concerns to their pharmacy: No  Patient reports adherence concerns with their medications:  Yes  - due to cost  At last visit reviewed patient's 2025 [facility] formulary. She has a $255 dedutible for any medications that are tier 3 or above. Therefore first refill for Myrbetriq, trospium would be $255 + $47 copay or $297, then would be $47 copay. Gemtesa cost is $100 copay - so first fill would be $355.  [name] states that she has not been able to afford Myrbetriq or trospium. She has taken Gemtesa in the past and it was effective but cost increased to $100 / month and she stopped due to cost.  She tried solifenacin 10mg  because it was a lower cost but it has not been very effective.   She also reports that cost of estrogen / Premarin cream is $40 cyclobenzaprine  cost per #60 is around $10/ hydrocodoneacetaminophen  cost if $29 per month.   Cost of fluticasone  NS and rosuvastatin  are $0     Objective:  Lab Results  Component Value Date    HGBA1C 5.7 01/15/2024    Lab Results  Component Value Date   CREATININE 0.72 01/15/2024   BUN 12 01/15/2024   NA 142 01/15/2024   K 4.3 01/15/2024   CL 103 01/15/2024   CO2 26 01/15/2024    Lab Results  Component Value Date   CHOL 165 01/15/2024   HDL 52.70 01/15/2024   LDLCALC 91 01/15/2024   TRIG 106.0 01/15/2024   CHOLHDL 3 01/15/2024    Medications Reviewed Today   Medications were not reviewed in this encounter       Assessment/Plan:   Medication Cost / Access:  - Checked cyclobenzaprine  cost will now by $4.90 since she has LIS. Also generic Myrbetriq will be $4.90 - [facility] was not able to check cost of Premarin Cream - needed RF - requested from Dr Melanie Lee. I am guessing that most that Premarin cream will be is $12.15. Hydrocodone  / acetaminophen  cost should be $4.90.   follow up planned in 3 months   Melanie Lee, PharmD Clinical Pharmacist [facility]

## 2024-04-02 DIAGNOSIS — M51369 Other intervertebral disc degeneration, lumbar region without mention of lumbar back pain or lower extremity pain: Secondary | ICD-10-CM | POA: Diagnosis not present

## 2024-04-02 DIAGNOSIS — M25551 Pain in right hip: Secondary | ICD-10-CM | POA: Diagnosis not present

## 2024-04-02 DIAGNOSIS — Z7409 Other reduced mobility: Secondary | ICD-10-CM | POA: Diagnosis not present

## 2024-04-02 DIAGNOSIS — R29898 Other symptoms and signs involving the musculoskeletal system: Secondary | ICD-10-CM | POA: Diagnosis not present

## 2024-04-07 DIAGNOSIS — Z7409 Other reduced mobility: Secondary | ICD-10-CM | POA: Diagnosis not present

## 2024-04-07 DIAGNOSIS — M51369 Other intervertebral disc degeneration, lumbar region without mention of lumbar back pain or lower extremity pain: Secondary | ICD-10-CM | POA: Diagnosis not present

## 2024-04-07 DIAGNOSIS — R29898 Other symptoms and signs involving the musculoskeletal system: Secondary | ICD-10-CM | POA: Diagnosis not present

## 2024-04-07 DIAGNOSIS — M25551 Pain in right hip: Secondary | ICD-10-CM | POA: Diagnosis not present

## 2024-04-12 ENCOUNTER — Telehealth: Payer: Self-pay | Admitting: Family Medicine

## 2024-04-12 NOTE — Telephone Encounter (Signed)
 No note in encounter?

## 2024-04-13 DIAGNOSIS — M51369 Other intervertebral disc degeneration, lumbar region without mention of lumbar back pain or lower extremity pain: Secondary | ICD-10-CM | POA: Diagnosis not present

## 2024-04-13 DIAGNOSIS — R29898 Other symptoms and signs involving the musculoskeletal system: Secondary | ICD-10-CM | POA: Diagnosis not present

## 2024-04-13 DIAGNOSIS — M25551 Pain in right hip: Secondary | ICD-10-CM | POA: Diagnosis not present

## 2024-04-13 DIAGNOSIS — Z7409 Other reduced mobility: Secondary | ICD-10-CM | POA: Diagnosis not present

## 2024-04-14 ENCOUNTER — Other Ambulatory Visit: Payer: Self-pay | Admitting: Family Medicine

## 2024-04-14 MED ORDER — HYDROCODONE-ACETAMINOPHEN 7.5-325 MG PO TABS: 1.0000 | ORAL_TABLET | Freq: Three times a day (TID) | ORAL | 0 refills | Status: DC | PRN

## 2024-04-14 NOTE — Telephone Encounter (Signed)
 Requesting: hydrocodone  7.5-325mg   Contract: 01/15/24 UDS: 01/15/24 Last Visit: 01/15/24 Next Visit: 05/18/24 Last Refill: 03/10/24 #90 and 0RF   Please Advise

## 2024-04-14 NOTE — Telephone Encounter (Unsigned)
 Copied from CRM [id number]. Topic: Clinical - Medication Refill >> Apr 14, 2024 12:48 PM Sasha H wrote: Medication: HYDROcodone -acetaminophen  (NORCO) 7.5-325 MG tablet   Has the patient contacted their pharmacy? Yes (Agent: If no, request that the patient contact the pharmacy for the refill. If patient does not wish to contact the pharmacy document the reason why and proceed with request.) (Agent: If yes, when and what did the pharmacy advise?)  This is the patient's preferred pharmacy:  [facility] [id number] - [place], [place] - [address] [place] [address] Phone: [phone] Fax: [phone]  Is this the correct pharmacy for this prescription? Yes If no, delete pharmacy and type the correct one.   Has the prescription been filled recently? Yes  Is the patient out of the medication? Yes  Has the patient been seen for an appointment in the last year OR does the patient have an upcoming appointment? Yes  Can we respond through MyChart? No  Agent: Please be advised that Rx refills may take up to 3 business days. We ask that you follow-up with your pharmacy.

## 2024-05-10 ENCOUNTER — Other Ambulatory Visit: Payer: Self-pay | Admitting: Family Medicine

## 2024-05-10 MED ORDER — HYDROCODONE-ACETAMINOPHEN 7.5-325 MG PO TABS: 1.0000 | ORAL_TABLET | Freq: Three times a day (TID) | ORAL | 0 refills | Status: DC | PRN

## 2024-05-10 MED ORDER — CYCLOBENZAPRINE HCL 5 MG PO TABS: ORAL_TABLET | ORAL | 1 refills | Status: DC

## 2024-05-10 NOTE — Telephone Encounter (Signed)
 Copied from CRM [id number]. Topic: Clinical - Medication Refill >> May 10, 2024  9:52 AM Emylou G wrote: Medication: cyclobenzaprine  (FLEXERIL ) 5 MG tablet HYDROcodone -acetaminophen  (NORCO) 7.5-325 MG tablet   Has the patient contacted their pharmacy? Yes (Agent: If no, request that the patient contact the pharmacy for the refill. If patient does not wish to contact the pharmacy document the reason why and proceed with request.) (Agent: If yes, when and what did the pharmacy advise?) said to call us    This is the patient's preferred pharmacy:  [facility] [id number] - [place], [place] - [address] [place] [address] Phone: [phone] Fax: [phone]  Is this the correct pharmacy for this prescription? Yes If no, delete pharmacy and type the correct one.   Has the prescription been filled recently? Yes  Is the patient out of the medication? Almost  Has the patient been seen for an appointment in the last year OR does the patient have an upcoming appointment? Yes  Can we respond through MyChart? No  Agent: Please be advised that Rx refills may take up to 3 business days. We ask that you follow-up with your pharmacy.

## 2024-05-10 NOTE — Telephone Encounter (Signed)
 Requesting: hydrocodone  7.5-325mg   Contract:01/15/24 UDS: 01/15/24 Last Visit: 01/15/24 Next Visit: 05/31/24 Last Refill: 04/14/24 #90 and 0RF  Please Advise

## 2024-05-10 NOTE — Telephone Encounter (Signed)
 04/14/24 90 tabs/0 RF (30 day)

## 2024-05-18 ENCOUNTER — Telehealth: Admitting: Family Medicine

## 2024-05-21 DIAGNOSIS — N390 Urinary tract infection, site not specified: Secondary | ICD-10-CM | POA: Diagnosis not present

## 2024-05-30 DIAGNOSIS — R200 Anesthesia of skin: Secondary | ICD-10-CM | POA: Insufficient documentation

## 2024-05-30 NOTE — Assessment & Plan Note (Signed)
,=  Supplement and monitor 

## 2024-05-30 NOTE — Assessment & Plan Note (Signed)
 hgba1c acceptable, minimize simple carbs. Increase exercise as tolerated.

## 2024-05-30 NOTE — Assessment & Plan Note (Signed)
 Tolerating statin, encouraged heart healthy diet, avoid trans fats, minimize simple carbs and saturated fats. Increase exercise as tolerated

## 2024-05-30 NOTE — Assessment & Plan Note (Signed)
 On left x 2 months

## 2024-05-31 ENCOUNTER — Ambulatory Visit (INDEPENDENT_AMBULATORY_CARE_PROVIDER_SITE_OTHER): Admitting: Family Medicine

## 2024-05-31 VITALS — BP 128/82 | HR 75 | Temp 98.9°F | Resp 12 | Ht 64.0 in | Wt 149.0 lb

## 2024-05-31 DIAGNOSIS — E559 Vitamin D deficiency, unspecified: Secondary | ICD-10-CM

## 2024-05-31 DIAGNOSIS — R700 Elevated erythrocyte sedimentation rate: Secondary | ICD-10-CM | POA: Diagnosis not present

## 2024-05-31 DIAGNOSIS — E785 Hyperlipidemia, unspecified: Secondary | ICD-10-CM | POA: Diagnosis not present

## 2024-05-31 DIAGNOSIS — R779 Abnormality of plasma protein, unspecified: Secondary | ICD-10-CM

## 2024-05-31 DIAGNOSIS — R200 Anesthesia of skin: Secondary | ICD-10-CM | POA: Diagnosis not present

## 2024-05-31 DIAGNOSIS — L989 Disorder of the skin and subcutaneous tissue, unspecified: Secondary | ICD-10-CM

## 2024-05-31 DIAGNOSIS — R739 Hyperglycemia, unspecified: Secondary | ICD-10-CM

## 2024-05-31 DIAGNOSIS — R202 Paresthesia of skin: Secondary | ICD-10-CM

## 2024-05-31 LAB — COMPREHENSIVE METABOLIC PANEL WITH GFR
ALT: 16 U/L (ref 0–35)
AST: 27 U/L (ref 0–37)
Albumin: 4.7 g/dL (ref 3.5–5.2)
Alkaline Phosphatase: 91 U/L (ref 39–117)
BUN: 9 mg/dL (ref 6–23)
CO2: 25 meq/L (ref 19–32)
Calcium: 9.6 mg/dL (ref 8.4–10.5)
Chloride: 105 meq/L (ref 96–112)
Creatinine, Ser: 0.65 mg/dL (ref 0.40–1.20)
GFR: 85.7 mL/min (ref >60.00)
Glucose, Bld: 90 mg/dL (ref 70–99)
Potassium: 4.2 meq/L (ref 3.5–5.1)
Sodium: 142 meq/L (ref 135–145)
Total Bilirubin: 0.6 mg/dL (ref 0.2–1.2)
Total Protein: 7.9 g/dL (ref 6.0–8.3)

## 2024-05-31 LAB — CBC WITH DIFFERENTIAL/PLATELET
Basophils Absolute: 0 K/uL (ref 0.0–0.1)
Basophils Relative: 0.4 % (ref 0.0–3.0)
Eosinophils Absolute: 0 K/uL (ref 0.0–0.7)
Eosinophils Relative: 0.3 % (ref 0.0–5.0)
HCT: 40.3 % (ref 36.0–46.0)
Hemoglobin: 13.2 g/dL (ref 12.0–15.0)
Lymphocytes Relative: 18.8 % (ref 12.0–46.0)
Lymphs Abs: 1.3 K/uL (ref 0.7–4.0)
MCHC: 32.8 g/dL (ref 30.0–36.0)
MCV: 88.4 fl (ref 78.0–100.0)
Monocytes Absolute: 0.6 K/uL (ref 0.1–1.0)
Monocytes Relative: 8.9 % (ref 3.0–12.0)
Neutro Abs: 5 K/uL (ref 1.4–7.7)
Neutrophils Relative %: 71.6 % (ref 43.0–77.0)
Platelets: 336 K/uL (ref 150.0–400.0)
RBC: 4.56 Mil/uL (ref 3.87–5.11)
RDW: 12.6 % (ref 11.5–15.5)
WBC: 7 K/uL (ref 4.0–10.5)

## 2024-05-31 LAB — LIPID PANEL
Cholesterol: 142 mg/dL (ref 0–200)
HDL: 59 mg/dL (ref >39.00)
LDL Cholesterol: 65 mg/dL (ref 0–99)
NonHDL: 82.87
Total CHOL/HDL Ratio: 2
Triglycerides: 91 mg/dL (ref 0.0–149.0)
VLDL: 18.2 mg/dL (ref 0.0–40.0)

## 2024-05-31 LAB — HEMOGLOBIN A1C: Hgb A1c MFr Bld: 5.9 % (ref 4.6–6.5)

## 2024-05-31 LAB — TSH: TSH: 1.96 u[IU]/mL (ref 0.35–5.50)

## 2024-05-31 LAB — VITAMIN D 25 HYDROXY (VIT D DEFICIENCY, FRACTURES): VITD: 26.67 ng/mL — ABNORMAL LOW (ref 30.00–100.00)

## 2024-05-31 LAB — VITAMIN B12: Vitamin B-12: 653 pg/mL (ref 211–911)

## 2024-05-31 LAB — SEDIMENTATION RATE: Sed Rate: 8 mm/h (ref 0–30)

## 2024-05-31 NOTE — Patient Instructions (Signed)
 Peripheral Neuropathy Peripheral neuropathy is a type of nerve damage. It affects nerves that carry signals between the spinal cord and the arms, legs, and the rest of the body (peripheral nerves). It does not affect nerves in the spinal cord or brain. In peripheral neuropathy, one nerve or a group of nerves may be damaged. Peripheral neuropathy is a broad category that includes many specific nerve disorders, like diabetic neuropathy, hereditary neuropathy, and carpal tunnel syndrome. What are the causes? This condition may be caused by: Certain diseases, such as: Diabetes. This is the most common cause of peripheral neuropathy. Autoimmune diseases, such as rheumatoid arthritis and systemic lupus erythematosus. Nerve diseases that are passed from parent to child (inherited). Kidney disease. Thyroid disease. Other causes may include: Nerve injury. Pressure or stress on a nerve that lasts a long time. Lack (deficiency) of B vitamins. This can result from alcoholism, poor diet, or a restricted diet. Infections. Some medicines, such as cancer medicines (chemotherapy). Poisonous (toxic) substances, such as lead and mercury. Too little blood flowing to the legs. In some cases, the cause of this condition is not known. What are the signs or symptoms? Symptoms of this condition depend on which of your nerves is damaged. Symptoms in the legs, hands, and arms can include: Loss of feeling (numbness) in the feet, hands, or both. Tingling in the feet, hands, or both. Burning pain. Very sensitive skin. Weakness. Not being able to move a part of the body (paralysis). Clumsiness or poor coordination. Muscle twitching. Loss of balance. Symptoms in other parts of the body can include: Not being able to control your bladder. Feeling dizzy. Sexual problems. How is this diagnosed? Diagnosing and finding the cause of peripheral neuropathy can be difficult. Your health care provider will take your  medical history and do a physical exam. A neurological exam will also be done. This involves checking things that are affected by your brain, spinal cord, and nerves (nervous system). For example, your health care provider will check your reflexes, how you move, and what you can feel. You may have other tests, such as: Blood tests. Electromyogram (EMG) and nerve conduction tests. These tests check nerve function and how well the nerves are controlling the muscles. Imaging tests, such as a CT scan or MRI, to rule out other causes of your symptoms. Removing a small piece of nerve to be examined in a lab (nerve biopsy). Removing and examining a small amount of the fluid that surrounds the brain and spinal cord (lumbar puncture). How is this treated? Treatment for this condition may involve: Treating the underlying cause of the neuropathy, such as diabetes, kidney disease, or vitamin deficiencies. Stopping medicines that can cause neuropathy, such as chemotherapy. Medicine to help relieve pain. Medicines may include: Prescription or over-the-counter pain medicine. Anti-seizure medicine. Antidepressants. Pain-relieving patches that are applied to painful areas of skin. Surgery to relieve pressure on a nerve or to destroy a nerve that is causing pain. Physical therapy to help improve movement and balance. Devices to help you move around (assistive devices). Follow these instructions at home: Medicines Take over-the-counter and prescription medicines only as told by your health care provider. Do not take any other medicines without first asking your health care provider. Ask your health care provider if the medicine prescribed to you requires you to avoid driving or using machinery. Lifestyle  Do not use any products that contain nicotine or tobacco. These products include cigarettes, chewing tobacco, and vaping devices, such as e-cigarettes. Smoking keeps  blood from reaching damaged nerves. If you  need help quitting, ask your health care provider. Avoid or limit alcohol. Too much alcohol can cause a vitamin B deficiency, and vitamin B is needed for healthy nerves. Eat a healthy diet. This includes: Eating foods that are high in fiber, such as beans, whole grains, and fresh fruits and vegetables. Limiting foods that are high in fat and processed sugars, such as fried or sweet foods. General instructions  If you have diabetes, work closely with your health care provider to keep your blood sugar under control. If you have numbness in your feet: Check every day for signs of injury or infection. Watch for redness, warmth, and swelling. Wear padded socks and comfortable shoes. These help protect your feet. Develop a good support system. Living with peripheral neuropathy can be stressful. Consider talking with a mental health specialist or joining a support group. Use assistive devices and attend physical therapy as told by your health care provider. This may include using a walker or a cane. Keep all follow-up visits. This is important. Where to find more information [employer] of Neurological Disorders: [employer] Contact a health care provider if: You have new signs or symptoms of peripheral neuropathy. You are struggling emotionally from dealing with peripheral neuropathy. Your pain is not well controlled. Get help right away if: You have an injury or infection that is not healing normally. You develop new weakness in an arm or leg. You have fallen or do so frequently. Summary Peripheral neuropathy is when the nerves in the arms or legs are damaged, resulting in numbness, weakness, or pain. There are many causes of peripheral neuropathy, including diabetes, pinched nerves, vitamin deficiencies, autoimmune disease, and hereditary conditions. Diagnosing and finding the cause of peripheral neuropathy can be difficult. Your health care provider will take your medical  history, do a physical exam, and do tests, including blood tests and nerve function tests. Treatment involves treating the underlying cause of the neuropathy and taking medicines to help control pain. Physical therapy and assistive devices may also help. This information is not intended to replace advice given to you by your health care provider. Make sure you discuss any questions you have with your health care provider. Document Revised: 06/05/2021 Document Reviewed: 06/05/2021 Elsevier Patient Education  2024 [employer].

## 2024-06-01 ENCOUNTER — Encounter: Payer: Self-pay | Admitting: Family Medicine

## 2024-06-01 ENCOUNTER — Ambulatory Visit: Payer: Self-pay | Admitting: Family Medicine

## 2024-06-01 NOTE — Progress Notes (Signed)
 Subjective:    Patient ID: Melanie Lee, female    DOB: 1946-03-02, 78 y.o.   MRN: [id number]  Chief Complaint  Patient presents with   toe numbness left foot   black dots on right arm    HPI Discussed the use of AI scribe software for clinical note transcription with the patient, who gave verbal consent to proceed.  History of Present Illness Melanie Lee is a 78 year old who presents with numbness in both big toes.  She has been experiencing numbness primarily in both big toes, with some involvement of other toes, for over a year. The sensation is described as 'numb' and is more pronounced in the big toes. There is no history of specific injury, fall, or back pain associated with the numbness. No radiation of pain from the back to the feet is reported.  She has a history of bilateral knee replacements completed over a year ago and has been undergoing physical therapy. The numbness in her toes was present before starting physical therapy and has not worsened during the therapy sessions.  She has an overactive bladder, for which she is taking medication. Additionally, she experiences occasional diarrhea, which she attributes to a recent course of ciprofloxacin  for an infection.  No recent falls, back pain, or tick bites. She has had a fever and reports arthritis and inflammation in her hip. No recent chest pain or chills.  She has noticed black spots on her fingertips and right arm, which are concerning to her, although she has not seen a dermatologist for evaluation. Denies CP/palp/SOB/HA/congestion/fevers/GI or GU c/o. 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   S/P excision of bony prominence, right knee 04/23/2022    Past Surgical History:  Procedure Laterality Date    ABDOMINAL HYSTERECTOMY  1995   total for fibroids   BONE EXCISION Right 04/23/2022   Procedure: Excision of bony prominence Right total knee;  Surgeon: Ernie Cough, MD;  Location: [facility];  Service: Orthopedics;  Laterality: Right;   BREAST BIOPSY  1995   benign   CATARACT EXTRACTION  2013 & 2015   Bilateral   KNEE SURGERY     Left Arthroscopy   left hip replacement Left 2022   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/2016   Procedure: TOTAL KNEE ARTHROPLASTY;  Surgeon: Tanda Heading, MD;  Location: [facility];  Service: Orthopedics;  Laterality: Left;   TOTAL KNEE ARTHROPLASTY Right 12/26/2020   Procedure: TOTAL KNEE ARTHROPLASTY;  Surgeon: Ernie Cough, MD;  Location: [facility];  Service: Orthopedics;  Laterality: Right;  70 mins    Family History  Problem Relation Age of Onset   Breast cancer Mother 52       Deceased   Heart disease Mother    Hypertension Mother    Hypertension Father        Deceased   Stroke Father    Hypertension Sister    Hypertension Brother    Heart defect Daughter    Hyperlipidemia Daughter    Hypertension Daughter    Vision loss Paternal Uncle    Stroke Brother     Social History   Socioeconomic History   Marital status: Single    Spouse name: Not on file   Number of children:  Not on file   Years of education: Not on file   Highest education level: Not on file  Occupational History   Not on file  Tobacco Use   Smoking status: Never   Smokeless tobacco: Never  Vaping Use   Vaping status: Never Used  Substance and Sexual Activity   Alcohol use: Never   Drug use: No   Sexual activity: Not Currently  Other Topics Concern   Not on file  Social History Narrative   Retired Programme researcher, broadcasting/film/video at [employer].      Right handed   Social Drivers of Health   Financial Resource Strain: High Risk (02/23/2024)   Overall Financial Resource Strain (CARDIA)    Difficulty of Paying Living Expenses: Hard  Food Insecurity: Food Insecurity Present (03/12/2024)    Hunger Vital Sign    Worried About Running Out of Food in the Last Year: Sometimes true    Ran Out of Food in the Last Year: Sometimes true  Transportation Needs: No Transportation Needs (03/12/2024)   PRAPARE - Administrator, Civil Service (Medical): No    Lack of Transportation (Non-Medical): No  Physical Activity: Inactive (02/23/2024)   Exercise Vital Sign    Days of Exercise per Week: 0 days    Minutes of Exercise per Session: 0 min  Stress: No Stress Concern Present (02/23/2024)   [employer] of Occupational Health - Occupational Stress Questionnaire    Feeling of Stress : Only a little  Social Connections: Socially Isolated (02/23/2024)   Social Connection and Isolation Panel    Frequency of Communication with Friends and Family: Three times a week    Frequency of Social Gatherings with Friends and Family: Twice a week    Attends Religious Services: Never    Database administrator or Organizations: No    Attends Banker Meetings: Never    Marital Status: Never married  Intimate Partner Violence: Not At Risk (03/12/2024)   Humiliation, Afraid, Rape, and Kick questionnaire    Fear of Current or Ex-Partner: No    Emotionally Abused: No    Physically Abused: No    Sexually Abused: No    Outpatient Medications Prior to Visit  Medication Sig Dispense Refill   cholecalciferol (VITAMIN D3) 25 MCG (1000 UNIT) tablet Take 1,000 Units by mouth daily.     ciprofloxacin  (CIPRO ) 500 MG tablet Take 500 mg by mouth 2 (two) times daily.     conjugated estrogens (PREMARIN) vaginal cream Place 1 Applicatorful vaginally every Monday, Wednesday, and Friday.     cyclobenzaprine  (FLEXERIL ) 5 MG tablet TAKE 1/2-1 TABLET BY MOUTH 2 TIMES A DAY AS NEEDED FOR MUSCLE SPASMS 60 tablet 1   fluticasone  (FLONASE ) 50 MCG/ACT nasal spray Place 2 sprays into both nostrils daily as needed for allergies. 18.2 mL 3   HYDROcodone -acetaminophen  (NORCO) 7.5-325 MG tablet Take 1 tablet  by mouth 3 (three) times daily as needed for severe pain (pain score 7-10). 90 tablet 0   Multiple Vitamins-Minerals (MULTIVITAMIN ADULTS 50+) TABS Take 1 tablet by mouth daily.      MYRBETRIQ 50 MG TB24 tablet Take 50 mg by mouth daily.     rosuvastatin  (CRESTOR ) 10 MG tablet Take 1 tablet (10 mg total) by mouth daily. 90 tablet 1   Trospium Chloride 60 MG CP24 Take 60 mg by mouth daily. (Patient not taking: Reported on 03/10/2024)     No facility-administered medications prior to visit.    Allergies  Allergen Reactions  Tramadol  Nausea Only and Other (See Comments)    Dizziness    Review of Systems  Constitutional:  Negative for fever and malaise/fatigue.  HENT:  Negative for congestion.   Eyes:  Negative for blurred vision.  Respiratory:  Negative for shortness of breath.   Cardiovascular:  Negative for chest pain, palpitations and leg swelling.  Gastrointestinal:  Negative for abdominal pain, blood in stool and nausea.  Genitourinary:  Negative for dysuria and frequency.  Musculoskeletal:  Negative for falls.  Skin:  Positive for rash.  Neurological:  Positive for sensory change. Negative for dizziness, loss of consciousness and headaches.  Endo/Heme/Allergies:  Negative for environmental allergies.  Psychiatric/Behavioral:  Negative for depression. The patient is not nervous/anxious.        Objective:    Physical Exam Constitutional:      General: She is not in acute distress.    Appearance: Normal appearance. She is well-developed. She is not toxic-appearing.  HENT:     Head: Normocephalic and atraumatic.     Right Ear: External ear normal.     Left Ear: External ear normal.     Nose: Nose normal.  Eyes:     General:        Right eye: No discharge.        Left eye: No discharge.     Conjunctiva/sclera: Conjunctivae normal.  Neck:     Thyroid : No thyromegaly.  Cardiovascular:     Rate and Rhythm: Normal rate and regular rhythm.     Heart sounds: Normal heart  sounds. No murmur heard. Pulmonary:     Effort: Pulmonary effort is normal. No respiratory distress.     Breath sounds: Normal breath sounds.  Abdominal:     General: Bowel sounds are normal.     Palpations: Abdomen is soft.     Tenderness: There is no abdominal tenderness. There is no guarding.  Musculoskeletal:        General: Normal range of motion.     Cervical back: Neck supple.  Lymphadenopathy:     Cervical: No cervical adenopathy.  Skin:    General: Skin is warm and dry.     Findings: Lesion present.     Comments: Brown spots finger tips and arms  Neurological:     Mental Status: She is alert and oriented to person, place, and time.     Sensory: Sensory deficit present.     Comments: Decreased sensation all 10 toes  Psychiatric:        Mood and Affect: Mood normal.        Behavior: Behavior normal.        Thought Content: Thought content normal.        Judgment: Judgment normal.     BP 128/82 (BP Location: Right Arm, Patient Position: Sitting, Cuff Size: Normal)   Pulse 75   Temp 98.9 F (37.2 C) (Oral)   Resp 12   Ht 5' 4 (1.626 m)   Wt 149 lb (67.6 kg)   SpO2 97%   BMI 25.58 kg/m  Wt Readings from Last 3 Encounters:  05/31/24 149 lb (67.6 kg)  02/23/24 145 lb (65.8 kg)  01/15/24 145 lb 3.2 oz (65.9 kg)    Diabetic Foot Exam - Simple   No data filed    Lab Results  Component Value Date   WBC 7.0 05/31/2024   HGB 13.2 05/31/2024   HCT 40.3 05/31/2024   PLT 336.0 05/31/2024   GLUCOSE 90 05/31/2024   CHOL  142 05/31/2024   TRIG 91.0 05/31/2024   HDL 59.00 05/31/2024   LDLCALC 65 05/31/2024   ALT 16 05/31/2024   AST 27 05/31/2024   NA 142 05/31/2024   K 4.2 05/31/2024   CL 105 05/31/2024   CREATININE 0.65 05/31/2024   BUN 9 05/31/2024   CO2 25 05/31/2024   TSH 1.96 05/31/2024   INR 1.03 04/19/2016   HGBA1C 5.9 05/31/2024    Lab Results  Component Value Date   TSH 1.96 05/31/2024   Lab Results  Component Value Date   WBC 7.0  05/31/2024   HGB 13.2 05/31/2024   HCT 40.3 05/31/2024   MCV 88.4 05/31/2024   PLT 336.0 05/31/2024   Lab Results  Component Value Date   NA 142 05/31/2024   K 4.2 05/31/2024   CHLORIDE 105 06/03/2016   CO2 25 05/31/2024   GLUCOSE 90 05/31/2024   BUN 9 05/31/2024   CREATININE 0.65 05/31/2024   BILITOT 0.6 05/31/2024   ALKPHOS 91 05/31/2024   AST 27 05/31/2024   ALT 16 05/31/2024   PROT 7.9 05/31/2024   ALBUMIN 4.7 05/31/2024   CALCIUM  9.6 05/31/2024   ANIONGAP 8 04/10/2022   EGFR >90 06/03/2016   GFR 85.70 05/31/2024   Lab Results  Component Value Date   CHOL 142 05/31/2024   Lab Results  Component Value Date   HDL 59.00 05/31/2024   Lab Results  Component Value Date   LDLCALC 65 05/31/2024   Lab Results  Component Value Date   TRIG 91.0 05/31/2024   Lab Results  Component Value Date   CHOLHDL 2 05/31/2024   Lab Results  Component Value Date   HGBA1C 5.9 05/31/2024       Assessment & Plan:  Vitamin D  deficiency Assessment & Plan: ,=Supplement and monitor   Orders: -     VITAMIN D  25 Hydroxy (Vit-D Deficiency, Fractures)  Hyperglycemia Assessment & Plan: hgba1c acceptable, minimize simple carbs. Increase exercise as tolerated.    Orders: -     Hemoglobin A1c -     TSH  Hyperlipidemia LDL goal <100 Assessment & Plan: Tolerating statin, encouraged heart healthy diet, avoid trans fats, minimize simple carbs and saturated fats. Increase exercise as tolerated  Orders: -     Lipid panel -     Comprehensive metabolic panel with GFR -     TSH  Numbness of toes Assessment & Plan: On left x 2 months  Orders: -     Ambulatory referral to Podiatry  Elevated blood protein  Paresthesias -     CBC with Differential/Platelet -     Vitamin B12 -     Vitamin B1  Elevated sed rate -     Vitamin B12 -     Vitamin B1 -     Sedimentation rate -     Ambulatory referral to Podiatry  Skin lesions -     Ambulatory referral to  Dermatology  Other orders -     Extra Specimen    Assessment and Plan Assessment & Plan Paresthesia and numbness of bilateral big toes and feet Chronic numbness primarily in the bilateral big toes, with some involvement of other toes, persisting for over a year. Differential includes peripheral neuropathy and possible spinal issues. Decision to start with podiatry evaluation due to lack of rapid progression and absence of major trauma. - Refer to podiatry for evaluation of feet - Order blood work including vitamin B12, thiamine , and sedimentation rate - Recheck blood  glucose levels  Right hip osteoarthritis with inflammation Chronic right hip arthritis with inflammation, contributing to discomfort and possibly affecting mobility.  Overactive bladder Overactive bladder with symptoms managed on medication. No recent falls or acute changes.  Diarrhea, likely antibiotic-associated Diarrhea likely secondary to recent ciprofloxacin  use. Advised to avoid milk and fiber during antibiotic course. Plan to add fiber post-antibiotic course to manage symptoms. - Advise completion of ciprofloxacin  course - Recommend adding fiber such as Benefiber or Metamucil after completing ciprofloxacin   New skin lesions on right arm, hands, and fingertips New skin lesions on right arm, hands, and fingertips. Lesions appear benign but require further evaluation to rule out malignancy. - Refer to dermatology for evaluation of skin lesions  Recording duration: 14 minutes     Harlene Horton, MD

## 2024-06-05 LAB — EXTRA SPECIMEN

## 2024-06-05 LAB — VITAMIN B1

## 2024-06-07 DIAGNOSIS — M7061 Trochanteric bursitis, right hip: Secondary | ICD-10-CM | POA: Diagnosis not present

## 2024-06-07 DIAGNOSIS — M1611 Unilateral primary osteoarthritis, right hip: Secondary | ICD-10-CM | POA: Diagnosis not present

## 2024-06-09 ENCOUNTER — Other Ambulatory Visit: Payer: Self-pay | Admitting: Family Medicine

## 2024-06-09 MED ORDER — HYDROCODONE-ACETAMINOPHEN 7.5-325 MG PO TABS: 1.0000 | ORAL_TABLET | Freq: Three times a day (TID) | ORAL | 0 refills | Status: DC | PRN

## 2024-06-09 NOTE — Telephone Encounter (Signed)
 Copied from CRM #[id number]. Topic: Clinical - Medication Refill >> Jun 09, 2024  2:45 PM Rosina BIRCH wrote: Medication: hydrocodone   Has the patient contacted their pharmacy? Yes (Agent: If no, request that the patient contact the pharmacy for the refill. If patient does not wish to contact the pharmacy document the reason why and proceed with request.) (Agent: If yes, when and what did the pharmacy advise?)  This is the patient's preferred pharmacy:  [facility] [id number] - [place], [place] - [address] [place] [address] Phone: [phone] Fax: [phone]  Is this the correct pharmacy for this prescription? Yes If no, delete pharmacy and type the correct one.   Has the prescription been filled recently? Yes  Is the patient out of the medication? No  Has the patient been seen for an appointment in the last year OR does the patient have an upcoming appointment? Yes  Can we respond through MyChart? No  Agent: Please be advised that Rx refills may take up to 3 business days. We ask that you follow-up with your pharmacy.

## 2024-06-09 NOTE — Telephone Encounter (Signed)
 Requesting: hydrocodone  Contract: 01/15/24 UDS: 01/15/24 Last Visit: 05/31/24 Next Visit: 07/02/24 Last Refill: 05/10/24 #90 and 0RF   Please Advise

## 2024-06-15 DIAGNOSIS — N3281 Overactive bladder: Secondary | ICD-10-CM | POA: Diagnosis not present

## 2024-06-16 ENCOUNTER — Other Ambulatory Visit (INDEPENDENT_AMBULATORY_CARE_PROVIDER_SITE_OTHER): Admitting: Pharmacist

## 2024-06-16 DIAGNOSIS — R32 Unspecified urinary incontinence: Secondary | ICD-10-CM

## 2024-06-16 DIAGNOSIS — R3915 Urgency of urination: Secondary | ICD-10-CM

## 2024-06-16 DIAGNOSIS — M170 Bilateral primary osteoarthritis of knee: Secondary | ICD-10-CM

## 2024-06-16 NOTE — Progress Notes (Signed)
 06/16/2024 Name: Melanie Lee MRN: [id number] DOB: [birth year]  Chief Complaint  Patient presents with   Medication Management    Melanie Lee is a 78 y.o. year old female who presented for a telephone visit.   They were referred to the pharmacist by their PCP for assistance in managing medication access and complex medication management.    Subjective:  Care Team: Primary Care Provider: Domenica Harlene LABOR, MD ; Next Scheduled Visit: 07/02/2024 for physical  Medication Access/Adherence  Current Pharmacy:  [facility] [id number] - [place], [place] - [address] [place]  [address] Phone: [phone] Fax: [phone]   Patient reports affordability concerns with their medications: Yes  - We applied for LIS at our visit 03/10/2024. Patient reports she has received a letter that she was approved for LIS but she has not filled any prescriptions yet.  Patient reports access/transportation concerns to their pharmacy: No  Patient reports adherence concerns with their medications:  Yes  - due to cost  Melanie Lee states that she has been able to afford Myrbetriq since she was approved for Extra Help / LIS.  She saw Dr Gwenith yesterday and no changes were made to her edications.   Patient asks about vitmain D supplementation. Her lat serum vitamin D  was a little low and she has started over-the-counter vitamin D  1000 units daily.   Last vitamin D  Lab Results  Component Value Date   VD25OH 26.67 (L) 05/31/2024    She also reports that cost of estrogen / Premarin cream is now $12.15 and affordable.   Patient also asks about [employer]. States they have been calling her about in home services but she is not sure that they are a legitimate company.    Objective:  Lab Results  Component Value Date   HGBA1C 5.9 05/31/2024    Lab Results  Component Value Date   CREATININE 0.65 05/31/2024   BUN 9 05/31/2024   NA 142  05/31/2024   K 4.2 05/31/2024   CL 105 05/31/2024   CO2 25 05/31/2024    Lab Results  Component Value Date   CHOL 142 05/31/2024   HDL 59.00 05/31/2024   LDLCALC 65 05/31/2024   TRIG 91.0 05/31/2024   CHOLHDL 2 05/31/2024    Medications Reviewed Today     Reviewed by Carla Milling, RPH-CPP (Pharmacist) on 06/16/24 at 1420  Med List Status: <None>   Medication Order Taking? Sig Documenting Provider Last Dose Status Informant  cholecalciferol (VITAMIN D3) 25 MCG (1000 UNIT) tablet [id number] Yes Take 1,000 Units by mouth daily. [provider]  Active    Patient not taking:   Discontinued 06/16/24 1416 (Completed Course)   conjugated estrogens (PREMARIN) vaginal cream [id number] Yes Place 1 Applicatorful vaginally every Monday, Wednesday, and Friday. [provider]  Active Family Member  cyclobenzaprine  (FLEXERIL ) 5 MG tablet [id number] Yes TAKE 1/2-1 TABLET BY MOUTH 2 TIMES A DAY AS NEEDED FOR MUSCLE SPASMS Domenica Harlene LABOR, MD  Active   fluticasone  (FLONASE ) 50 MCG/ACT nasal spray [id number] Yes Place 2 sprays into both nostrils daily as needed for allergies. Domenica Harlene LABOR, MD  Active   HYDROcodone -acetaminophen  [facility]) 7.5-325 MG tablet [id number] Yes Take 1 tablet by mouth 3 (three) times daily as needed for severe pain (pain score 7-10). Domenica Harlene LABOR, MD  Active   Multiple Vitamins-Minerals (MULTIVITAMIN ADULTS 50+) TABS [id number] Yes Take 1 tablet by mouth daily.  [provider]  Active Family Member  MYRBETRIQ 50 MG TB24 tablet [id number] Yes Take 50 mg by mouth daily. [provider]  Active   rosuvastatin  (CRESTOR ) 10 MG tablet [id number] Yes Take 1 tablet (10 mg total) by mouth daily. Domenica Harlene LABOR, MD  Active               Assessment/Plan:   Medication Cost / Access:  - medications are affordable now she has qualified for LIS / Extra help.  - Continue to take vitamin D  1000 units daily. Also discussed dietary sources of vitamin D   - fortified dairy products, eggs, salmon, mackerel, beef or chicken liver.  - referral for social worker completed for assistance with in home care.   follow up planned January 2026 to check 2026 formulary and LIS status.   Melanie Lee, PharmD Clinical Pharmacist [facility]

## 2024-06-23 DIAGNOSIS — B353 Tinea pedis: Secondary | ICD-10-CM | POA: Diagnosis not present

## 2024-06-23 DIAGNOSIS — M7742 Metatarsalgia, left foot: Secondary | ICD-10-CM | POA: Diagnosis not present

## 2024-06-23 DIAGNOSIS — M2021 Hallux rigidus, right foot: Secondary | ICD-10-CM | POA: Diagnosis not present

## 2024-06-23 DIAGNOSIS — M2041 Other hammer toe(s) (acquired), right foot: Secondary | ICD-10-CM | POA: Diagnosis not present

## 2024-06-23 DIAGNOSIS — M2042 Other hammer toe(s) (acquired), left foot: Secondary | ICD-10-CM | POA: Diagnosis not present

## 2024-06-23 DIAGNOSIS — M7741 Metatarsalgia, right foot: Secondary | ICD-10-CM | POA: Diagnosis not present

## 2024-07-01 ENCOUNTER — Telehealth: Payer: Self-pay

## 2024-07-01 NOTE — Progress Notes (Signed)
 Complex Care Management Note  Care Guide Note 07/01/2024 Name: Melanie Lee MRN: [id number] DOB: 09-30-1946  Melanie Lee is a 78 y.o. year old female who sees Domenica Harlene LABOR, MD for primary care. I reached out to [facility]ly[facility] [place] by phone today to offer complex care management services.  [name] was given information about Complex Care Management services today including:   The Complex Care Management services include support from the care team which includes your Nurse Care Manager, Clinical Social Worker, or Pharmacist.  The Complex Care Management team is here to help remove barriers to the health concerns and goals most important to you. Complex Care Management services are voluntary, and the patient may decline or stop services at any time by request to their care team member.   Complex Care Management Consent Status: Patient agreed to services and verbal consent obtained.   Follow up plan:  Telephone appointment with complex care management team member scheduled for:  07/13/24 at 10:00 a.m.   Encounter Outcome:  Patient Scheduled  Dreama [facility]  [facility], [facility] VBCI Assistant Direct Dial: [phone]  Fax: [phone]

## 2024-07-01 NOTE — Progress Notes (Signed)
 Complex Care Management Note Care Guide Note  07/01/2024 Name: Melanie Lee MRN: [id number] DOB: 1946/05/12   Complex Care Management Outreach Attempts: An unsuccessful telephone outreach was attempted today to offer the patient information about available complex care management services.  Follow Up Plan:  Additional outreach attempts will be made to offer the patient complex care management information and services.   Encounter Outcome:  No Answer  Dreama [facility]  [facility], [facility] VBCI Assistant Direct Dial: [phone]  Fax: [phone]

## 2024-07-02 ENCOUNTER — Encounter: Admitting: Student

## 2024-07-06 ENCOUNTER — Other Ambulatory Visit: Payer: Self-pay | Admitting: Family Medicine

## 2024-07-06 DIAGNOSIS — N3281 Overactive bladder: Secondary | ICD-10-CM | POA: Diagnosis not present

## 2024-07-06 MED ORDER — HYDROCODONE-ACETAMINOPHEN 7.5-325 MG PO TABS: 1.0000 | ORAL_TABLET | Freq: Three times a day (TID) | ORAL | 0 refills | Status: DC | PRN

## 2024-07-06 NOTE — Telephone Encounter (Unsigned)
 Copied from CRM #[id number]. Topic: Clinical - Medication Refill >> Jul 06, 2024  1:14 PM Henretta I wrote: Medication:  HYDROcodone -acetaminophen  (NORCO) 7.5-325 MG tablet    Has the patient contacted their pharmacy? No (Agent: If no, request that the patient contact the pharmacy for the refill. If patient does not wish to contact the pharmacy document the reason why and proceed with request.) (Agent: If yes, when and what did the pharmacy advise?)  This is the patient's preferred pharmacy:  [facility] [id number] - [place], [place] - [address] [place] [address] Phone: [phone] Fax: [phone]  Is this the correct pharmacy for this prescription? Yes If no, delete pharmacy and type the correct one.   Has the prescription been filled recently? No  Is the patient out of the medication? No  Has the patient been seen for an appointment in the last year OR does the patient have an upcoming appointment? Yes  Can we respond through MyChart? No  Agent: Please be advised that Rx refills may take up to 3 business days. We ask that you follow-up with your pharmacy.

## 2024-07-06 NOTE — Telephone Encounter (Signed)
 Requesting: hydrocodone  7.5-325mg   Contract:01/15/24 UDS: 01/15/24 Last Visit: 05/31/24 Next Visit: 09/17/24 w/ Harlene GRADE Last Refill: 06/09/24 #90 and 0RF   Please Advise

## 2024-07-08 DIAGNOSIS — I781 Nevus, non-neoplastic: Secondary | ICD-10-CM | POA: Diagnosis not present

## 2024-07-08 DIAGNOSIS — L918 Other hypertrophic disorders of the skin: Secondary | ICD-10-CM | POA: Diagnosis not present

## 2024-07-08 DIAGNOSIS — L810 Postinflammatory hyperpigmentation: Secondary | ICD-10-CM | POA: Diagnosis not present

## 2024-07-13 ENCOUNTER — Other Ambulatory Visit: Payer: Self-pay | Admitting: Licensed Clinical Social Worker

## 2024-07-13 NOTE — Patient Outreach (Signed)
 Complex Care Management   Visit Note  07/13/2024  Name:  Melanie Lee MRN: [id number] DOB: 1946-02-20  Situation: Referral received for Complex Care Management related to SDOH Barriers:  Financial Resource Strain I obtained verbal consent from Patient.  Visit completed with Patient  on the phone  Background:   Past Medical History:  Diagnosis Date   Elevated blood pressure (not hypertension)    elevates sometimes with MD visits   H/O measles    Osteoarthritis (arthritis due to wear and tear of joints)    Bilateral knees   S/P excision of bony prominence, right knee 04/23/2022    Assessment: Patient Reported Symptoms:  Cognitive Cognitive Status: Alert and oriented to person, place, and time, Normal speech and language skills, Insightful and able to interpret abstract concepts Cognitive/Intellectual Conditions Management [RPT]: None reported or documented in medical history or problem list   Health Maintenance Behaviors: Annual physical exam Healing Pattern: Unsure Health Facilitated by: Stress management  Neurological Neurological Review of Symptoms: No symptoms reported    HEENT HEENT Symptoms Reported: No symptoms reported      Cardiovascular Cardiovascular Symptoms Reported: No symptoms reported    Respiratory Respiratory Symptoms Reported: No symptoms reported    Endocrine Endocrine Symptoms Reported: No symptoms reported    Gastrointestinal Gastrointestinal Symptoms Reported: No symptoms reported      Genitourinary Genitourinary Symptoms Reported: No symptoms reported    Integumentary Integumentary Symptoms Reported: No symptoms reported    Musculoskeletal Musculoskelatal Symptoms Reviewed: No symptoms reported        Psychosocial Psychosocial Symptoms Reported: No symptoms reported     Quality of Family Relationships: helpful, involved, supportive Do you feel physically threatened by others?: No    07/13/2024    PHQ2-9 Depression Screening   Little  interest or pleasure in doing things    Feeling down, depressed, or hopeless    PHQ-2 - Total Score    Trouble falling or staying asleep, or sleeping too much    Feeling tired or having little energy    Poor appetite or overeating     Feeling bad about yourself - or that you are a failure or have let yourself or your family down    Trouble concentrating on things, such as reading the newspaper or watching television    Moving or speaking so slowly that other people could have noticed.  Or the opposite - being so fidgety or restless that you have been moving around a lot more than usual    Thoughts that you would be better off dead, or hurting yourself in some way    PHQ2-9 Total Score    If you checked off any problems, how difficult have these problems made it for you to do your work, take care of things at home, or get along with other people    Depression Interventions/Treatment      There were no vitals filed for this visit.  Medications Reviewed Today     Reviewed by Kit Alm LABOR, LCSW (Social Worker) on 07/13/24 at 1008  Med List Status: <None>   Medication Order Taking? Sig Documenting Provider Last Dose Status Informant  cholecalciferol (VITAMIN D3) 25 MCG (1000 UNIT) tablet [id number] Yes Take 1,000 Units by mouth daily. [provider]  Active   conjugated estrogens (PREMARIN) vaginal cream [id number] Yes Place 1 Applicatorful vaginally every Monday, Wednesday, and Friday. [provider]  Active Family Member  cyclobenzaprine  (FLEXERIL ) 5 MG tablet [id number] Yes TAKE 1/2-1 TABLET BY MOUTH  2 TIMES A DAY AS NEEDED FOR MUSCLE SPASMS Domenica Harlene LABOR, MD  Active   fluticasone  (FLONASE ) 50 MCG/ACT nasal spray [id number] Yes Place 2 sprays into both nostrils daily as needed for allergies. Domenica Harlene LABOR, MD  Active   HYDROcodone -acetaminophen  [facility]) 7.5-325 MG tablet [id number] Yes Take 1 tablet by mouth 3 (three) times daily as needed for severe pain (pain score  7-10). Webb, Padonda B, FNP  Active   Multiple Vitamins-Minerals (MULTIVITAMIN ADULTS 50+) TABS [id number] Yes Take 1 tablet by mouth daily.  [provider]  Active Family Member  MYRBETRIQ 50 MG TB24 tablet [id number] Yes Take 50 mg by mouth daily. [provider]  Active   rosuvastatin  (CRESTOR ) 10 MG tablet [id number] Yes Take 1 tablet (10 mg total) by mouth daily. Domenica Harlene LABOR, MD  Active             Recommendation:   Continue Current Plan of Care  Follow Up Plan:   Patient has met all care management goals. Care Management case will be closed. Patient has been provided contact information should new needs arise.   Alm Armor, LCSW [facility], [facility] Licensed Clinical Social Worker Care Coordinator [phone]

## 2024-07-13 NOTE — Patient Instructions (Signed)
 Visit Information  Thank you for taking time to visit with me today. Please don't hesitate to contact me if I can be of assistance to you before our next scheduled appointment.  Our next appointment is no further scheduled appointments.   Please call the care guide team at [phone] if you need to cancel or reschedule your appointment.   Following is a copy of your care plan:   Goals Addressed             This Visit's Progress    COMPLETED: [facility] Social Work Care Plan       Problems:   Corporate treasurer  and Medicaid Application  CSW Clinical Goal(s):   Over the next 4 weeks the Patient will explore community resource options for unmet needs related to Financial Strain .  Interventions:  Social Determinants of Health in Patient with Osteoarthritis: SDOH assessments completed: Financial Strain  Evaluation of current treatment plan related to unmet needs Medicaid Application reviewed eligibility requirements and application process  Patient Goals/Self-Care Activities:  Review educational material on Medicaid Application. Complete Medicaid Application with dtr at local DSS office  Plan:   No further follow up required: Please re-refer with any new needs assessed.        Please call the Suicide and Crisis Lifeline: 988 if you are experiencing a Mental Health or Behavioral Health Crisis or need someone to talk to.  Patient verbalizes understanding of instructions and care plan provided today and agrees to view in MyChart. Active MyChart status and patient understanding of how to access instructions and care plan via MyChart confirmed with patient.     Alm Armor, LCSW [facility], [facility], [facility] Licensed Clinical Social Worker Care Coordinator [phone]

## 2024-07-30 ENCOUNTER — Other Ambulatory Visit: Payer: Self-pay | Admitting: Family Medicine

## 2024-07-30 NOTE — Telephone Encounter (Unsigned)
 Copied from CRM [id number]. Topic: Clinical - Medication Refill >> Jul 30, 2024  3:43 PM Paige D wrote: Medication: cyclobenzaprine  (FLEXERIL ) 5 MG tablet HYDROcodone -acetaminophen  (NORCO) 7.5-325 MG tablet  Has the patient contacted their pharmacy? Yes (Agent: If no, request that the patient contact the pharmacy for the refill. If patient does not wish to contact the pharmacy document the reason why and proceed with request.) (Agent: If yes, when and what did the pharmacy advise?)  This is the patient's preferred pharmacy:  [facility] [id number] - [place] - [address] [place] [address] Phone: [phone] Fax: [phone]  Is this the correct pharmacy for this prescription? Yes If no, delete pharmacy and type the correct one.   Has the prescription been filled recently? No  Is the patient out of the medication? No  Has the patient been seen for an appointment in the last year OR does the patient have an upcoming appointment? Yes  Can we respond through MyChart? No, phone call   Agent: Please be advised that Rx refills may take up to 3 business days. We ask that you follow-up with your pharmacy.

## 2024-08-02 MED ORDER — CYCLOBENZAPRINE HCL 5 MG PO TABS: ORAL_TABLET | ORAL | 1 refills | Status: DC

## 2024-08-02 MED ORDER — HYDROCODONE-ACETAMINOPHEN 7.5-325 MG PO TABS: 1.0000 | ORAL_TABLET | Freq: Three times a day (TID) | ORAL | 0 refills | Status: DC | PRN

## 2024-08-02 NOTE — Telephone Encounter (Signed)
 Requesting: hydrocodone   Contract: 01/15/24 UDS: 01/15/24 Last Visit: 05/31/24 Next Visit: 09/17/24 w/ Wheeler Last Refill: 07/06/24 #90 and 0RF   Please Advise

## 2024-08-19 ENCOUNTER — Other Ambulatory Visit: Payer: Self-pay

## 2024-08-19 MED ORDER — ROSUVASTATIN CALCIUM 10 MG PO TABS: 10.0000 mg | ORAL_TABLET | Freq: Every day | ORAL | 1 refills | Status: AC

## 2024-08-31 ENCOUNTER — Other Ambulatory Visit: Payer: Self-pay | Admitting: Family Medicine

## 2024-08-31 NOTE — Telephone Encounter (Unsigned)
 Copied from CRM #[id number]. Topic: Clinical - Medication Refill >> Aug 31, 2024  4:46 PM Shereese L wrote: Medication: HYDROcodone -acetaminophen  (NORCO) 7.5-325 MG tablet  Has the patient contacted their pharmacy? Yes (Agent: If no, request that the patient contact the pharmacy for the refill. If patient does not wish to contact the pharmacy document the reason why and proceed with request.) (Agent: If yes, when and what did the pharmacy advise?)  This is the patient's preferred pharmacy:  [facility] [id number] - [place], [place] - [address] [place] [address] Phone: [phone] Fax: [phone]  Is this the correct pharmacy for this prescription? Yes If no, delete pharmacy and type the correct one.   Has the prescription been filled recently? Yes  Is the patient out of the medication? NO  Has the patient been seen for an appointment in the last year OR does the patient have an upcoming appointment? Yes  Can we respond through MyChart? Yes  Agent: Please be advised that Rx refills may take up to 3 business days. We ask that you follow-up with your pharmacy.

## 2024-09-01 MED ORDER — HYDROCODONE-ACETAMINOPHEN 7.5-325 MG PO TABS: 1.0000 | ORAL_TABLET | Freq: Three times a day (TID) | ORAL | 0 refills | Status: DC | PRN

## 2024-09-01 NOTE — Telephone Encounter (Signed)
 Requesting: Norco 7.5-325 MG Contract: 01/15/2024 UDS: 01/15/2024 Last Visit: 05/31/2024 Next Visit: 09/17/2024 Last Refill: 08/02/2024  Please Advise

## 2024-09-16 NOTE — Progress Notes (Unsigned)
 Subjective:     Patient ID: Melanie Lee, female    DOB: [birth year], 78 y.o.   MRN: [id number]  Chief Complaint  Patient presents with   Annual Exam    Physical exam    HPI  Discussed the use of AI scribe software for clinical note transcription with the patient, who gave verbal consent to proceed.  History of Present Illness          History of Present Illness Melanie Lee is a 78 year old presents for CPE. She lives alone, ambulates with walking cane. Denies falls. Feels safe in her home. Eats a general diet, tries to eat healthy. Their hip osteoarthritis causes pain and limits mobility. They use a cane and plan to start gym-based exercise to help manage symptoms. She has not been taking vitamin D  regularly. Last DEXA scan was in 202 and was normal.   They have persistent overactive bladder symptoms, and Myrbetriq is not providing adequate relief. They have a urology follow-up later this month.  Denies vision or hearing problems. Sees optometrist annual, plan to get dentures soon.  HLD-rosuvastatin  10 mg daily  OAB Myrbetriq 50 mg  Vitamin D  deficiency-1000 units vitamin D  daily  Patient denies fever, chills, SOB, CP, palpitations, dyspnea, edema, HA, vision changes, N/V/D, abdominal pain, rash, weight changes, and recent illness or hospitalizations.   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)    Bilateral knees   S/P excision of bony prominence, right knee 04/23/2022    Past Surgical History:  Procedure Laterality Date   ABDOMINAL HYSTERECTOMY  1995   total for fibroids   BONE EXCISION Right 04/23/2022   Procedure: Excision of bony prominence Right total knee;   Surgeon: Ernie Cough, MD;  Location: [facility];  Service: Orthopedics;  Laterality: Right;   BREAST BIOPSY  1995   benign   CATARACT EXTRACTION  2013 & 2015   Bilateral   KNEE SURGERY     Left Arthroscopy   left hip replacement Left 2022   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/2016   Procedure: TOTAL KNEE ARTHROPLASTY;  Surgeon: Tanda Heading, MD;  Location: [facility];  Service: Orthopedics;  Laterality: Left;   TOTAL KNEE ARTHROPLASTY Right 12/26/2020   Procedure: TOTAL KNEE ARTHROPLASTY;  Surgeon: Ernie Cough, MD;  Location: [facility];  Service: Orthopedics;  Laterality: Right;  70 mins    Family History  Problem Relation Age of Onset   Breast cancer Mother 10       Deceased   Heart disease Mother    Hypertension Mother    Hypertension Father        Deceased   Stroke Father    Hypertension Sister    Hypertension Brother    Heart defect Daughter    Hyperlipidemia Daughter    Hypertension Daughter    Vision loss Paternal Uncle    Stroke Brother     Social History   Socioeconomic History   Marital status: Single    Spouse name: Not on file   Number of children: Not on file   Years of education: Not on file   Highest education level: Not on file  Occupational  History   Not on file  Tobacco Use   Smoking status: Never   Smokeless tobacco: Never  Vaping Use   Vaping status: Never Used  Substance and Sexual Activity   Alcohol use: Never   Drug use: No   Sexual activity: Not Currently  Other Topics Concern   Not on file  Social History Narrative   Retired Programme Researcher, Broadcasting/film/video at [employer].      Right handed   Social Drivers of Health   Financial Resource Strain: High Risk (02/23/2024)   Overall Financial Resource Strain (CARDIA)    Difficulty of Paying Living Expenses: Hard  Food Insecurity: Food Insecurity Present (07/13/2024)   Hunger Vital Sign    Worried About Running Out of Food in the Last Year: Sometimes true    Ran Out of Food in the Last Year: Sometimes true   Transportation Needs: No Transportation Needs (07/13/2024)   PRAPARE - Administrator, Civil Service (Medical): No    Lack of Transportation (Non-Medical): No  Physical Activity: Inactive (02/23/2024)   Exercise Vital Sign    Days of Exercise per Week: 0 days    Minutes of Exercise per Session: 0 min  Stress: No Stress Concern Present (02/23/2024)   [employer] of Occupational Health - Occupational Stress Questionnaire    Feeling of Stress : Only a little  Social Connections: Socially Isolated (02/23/2024)   Social Connection and Isolation Panel    Frequency of Communication with Friends and Family: Three times a week    Frequency of Social Gatherings with Friends and Family: Twice a week    Attends Religious Services: Never    Database Administrator or Organizations: No    Attends Banker Meetings: Never    Marital Status: Never married  Intimate Partner Violence: Not At Risk (07/13/2024)   Humiliation, Afraid, Rape, and Kick questionnaire    Fear of Current or Ex-Partner: No    Emotionally Abused: No    Physically Abused: No    Sexually Abused: No    Outpatient Medications Prior to Visit  Medication Sig Dispense Refill   cholecalciferol (VITAMIN D3) 25 MCG (1000 UNIT) tablet Take 1,000 Units by mouth daily.     conjugated estrogens (PREMARIN) vaginal cream Place 1 Applicatorful vaginally every Monday, Wednesday, and Friday.     cyclobenzaprine  (FLEXERIL ) 5 MG tablet TAKE 1/2-1 TABLET BY MOUTH 2 TIMES A DAY AS NEEDED FOR MUSCLE SPASMS 60 tablet 1   fluticasone  (FLONASE ) 50 MCG/ACT nasal spray Place 2 sprays into both nostrils daily as needed for allergies. 18.2 mL 3   HYDROcodone -acetaminophen  (NORCO) 7.5-325 MG tablet Take 1 tablet by mouth 3 (three) times daily as needed for severe pain (pain score 7-10). 90 tablet 0   Multiple Vitamins-Minerals (MULTIVITAMIN ADULTS 50+) TABS Take 1 tablet by mouth daily.      MYRBETRIQ 50 MG TB24 tablet Take 50 mg by  mouth daily.     rosuvastatin  (CRESTOR ) 10 MG tablet Take 1 tablet (10 mg total) by mouth daily. 90 tablet 1   No facility-administered medications prior to visit.    Allergies  Allergen Reactions   Tramadol  Nausea Only and Other (See Comments)    Dizziness    ROS See HPI    Objective:      Physical Exam Constitutional:      General: She is not in acute distress.    Appearance: Normal appearance.  She is not toxic-appearing.  HENT:     Head:  Normocephalic and atraumatic.     Right Ear: External ear normal. TM-WNL    Left Ear: External ear normal. TM-WNL    Nose: Nose normal. No drainage.     Mouth: moist, pink Eyes:     General:        Right eye: No discharge.        Left eye: No discharge.     Conjunctiva/sclera: Conjunctivae normal.  Neck:     Thyroid : No thyromegaly.  Cardiovascular:     Rate and Rhythm: Normal rate and regular rhythm.     Heart sounds: Normal heart sounds. No murmur heard. Pulmonary:     Effort: Pulmonary effort is normal. No respiratory distress.     Breath sounds: Normal breath sounds. CTAB.  Abdominal:     Bowel sounds are normal, Abdomen soft. No abdominal tenderness.  Musculoskeletal:        General: Normal range of motion. +ambulates with cane    Cervical back: Neck supple.  Lymphadenopathy:     Cervical: No cervical adenopathy.  Skin:    General: Skin is warm and dry. No rash. Neurological:     Mental Status: She is alert and oriented to person, place, and time.  Psychiatric:        Mood and Affect: Mood normal.        Behavior: Behavior normal.        Thought Content: Thought content normal.        Judgment: Judgment normal.    BP 138/87 (BP Location: Right Arm)   Pulse 85   Temp 98.4 F (36.9 C) (Oral)   Ht 5' 4 (1.626 m)   Wt 148 lb 3.2 oz (67.2 kg)   SpO2 97%   BMI 25.44 kg/m  Wt Readings from Last 3 Encounters:  09/17/24 148 lb 3.2 oz (67.2 kg)  05/31/24 149 lb (67.6 kg)  02/23/24 145 lb (65.8 kg)        Assessment & Plan:   Problem List Items Addressed This Visit     Hyperglycemia   hgba1c acceptable, minimize simple carbs. Increase exercise as tolerated.           Hyperlipidemia LDL goal <100   Tolerating statin, encouraged heart healthy diet, avoid trans fats, minimize simple carbs and saturated fats. Increase exercise as tolerated       OAB (overactive bladder)   Persistent symptoms despite Myrbetriq. Follows with [facility]- Dr. Gwenith, has upcoming appointment.       Relevant Orders   Ambulatory referral to Physical Therapy   Preventative health care - Primary   Patient encouraged to maintain heart healthy diet, regular exercise, adequate sleep. Consider daily probiotics. Take medications as prescribed.    HCM Immunizations: May get Covid and Shingles at pharmacy DEXA: Last 2022; Repeat DEXA Pap: TAH       Vitamin D  deficiency   Supplement and monitor. nconsistent vitamin D  supplementation. Discussed importance of vitamin D  for bone health and calcium  absorption. - Start vitamin D  1000 IU daily. - Recheck vitamin D  levels today.      Relevant Orders   Vitamin D  (25 hydroxy)   Other Visit Diagnoses       Postmenopausal estrogen deficiency       Relevant Orders   DG Bone Density      Adult Wellness Visit Routine wellness visit with discussion on diet, exercise, and general health maintenance.  - Continue regular exercise and healthy diet. - Continue annual eye exams. - Monitor blood  pressure at home occasioanlly; RTC >140/90 - Consider COVID and shingles vaccines at pharmacy.  FU 6 months  I am having Kylea Caicedo maintain her Multivitamin Adults 50+, conjugated estrogens, fluticasone , cholecalciferol, Myrbetriq, cyclobenzaprine , rosuvastatin , and HYDROcodone -acetaminophen .  No orders of the defined types were placed in this encounter.

## 2024-09-17 ENCOUNTER — Encounter: Payer: Self-pay | Admitting: Student

## 2024-09-17 ENCOUNTER — Ambulatory Visit: Admitting: Student

## 2024-09-17 VITALS — BP 138/87 | HR 85 | Temp 98.4°F | Ht 64.0 in | Wt 148.2 lb

## 2024-09-17 DIAGNOSIS — E785 Hyperlipidemia, unspecified: Secondary | ICD-10-CM | POA: Diagnosis not present

## 2024-09-17 DIAGNOSIS — Z0000 Encounter for general adult medical examination without abnormal findings: Secondary | ICD-10-CM | POA: Insufficient documentation

## 2024-09-17 DIAGNOSIS — E559 Vitamin D deficiency, unspecified: Secondary | ICD-10-CM

## 2024-09-17 DIAGNOSIS — Z780 Asymptomatic menopausal state: Secondary | ICD-10-CM

## 2024-09-17 DIAGNOSIS — R739 Hyperglycemia, unspecified: Secondary | ICD-10-CM | POA: Diagnosis not present

## 2024-09-17 DIAGNOSIS — N3281 Overactive bladder: Secondary | ICD-10-CM

## 2024-09-17 LAB — VITAMIN D 25 HYDROXY (VIT D DEFICIENCY, FRACTURES): Vit D, 25-Hydroxy: 30 ng/mL (ref 30–100)

## 2024-09-17 NOTE — Assessment & Plan Note (Signed)
 hgba1c acceptable, minimize simple carbs. Increase exercise as tolerated.

## 2024-09-17 NOTE — Assessment & Plan Note (Addendum)
 Patient encouraged to maintain heart healthy diet, regular exercise, adequate sleep. Consider daily probiotics. Take medications as prescribed.    HCM Immunizations: May get Covid and Shingles at pharmacy DEXA: Last 2022; Repeat DEXA Pap: TAH

## 2024-09-17 NOTE — Assessment & Plan Note (Signed)
 Tolerating statin, encouraged heart healthy diet, avoid trans fats, minimize simple carbs and saturated fats. Increase exercise as tolerated

## 2024-09-17 NOTE — Assessment & Plan Note (Signed)
 Persistent symptoms despite Myrbetriq. Follows with [facility]- Dr. Gwenith, has upcoming appointment.

## 2024-09-17 NOTE — Assessment & Plan Note (Addendum)
 Supplement and monitor. nconsistent vitamin D  supplementation. Discussed importance of vitamin D  for bone health and calcium  absorption. - Start vitamin D  1000 IU daily. - Recheck vitamin D  levels today.

## 2024-09-18 ENCOUNTER — Ambulatory Visit: Payer: Self-pay | Admitting: Student

## 2024-09-21 ENCOUNTER — Telehealth: Payer: Self-pay | Admitting: Family Medicine

## 2024-09-21 NOTE — Telephone Encounter (Signed)
 Copied from CRM #[id number]. Topic: Referral - Status >> Sep 21, 2024  2:55 PM Drema MATSU wrote: Reason for CRM: Marcus with [facility] stated that they received the referral with no demographics. She is only needing the phone number and they will get the rest later from pt.

## 2024-09-23 NOTE — Telephone Encounter (Signed)
 Spoke w/ Marcus and she was requesting the patients phone number due to the demographics information she received did not have it on there.

## 2024-09-23 NOTE — Telephone Encounter (Unsigned)
 Copied from CRM [id number]. Topic: Referral - Question >> Sep 23, 2024 11:08 AM Tinnie BROCKS wrote: Reason for CRM: Debby with [facility] calling to let us  know that demographics were not received for pt on PT order. Needs  phone # and address.  Fax# [phone] Ph#[phone] (noel is name of rep at this #)

## 2024-09-28 ENCOUNTER — Other Ambulatory Visit: Payer: Self-pay | Admitting: Family Medicine

## 2024-09-28 MED ORDER — HYDROCODONE-ACETAMINOPHEN 7.5-325 MG PO TABS: 1.0000 | ORAL_TABLET | Freq: Three times a day (TID) | ORAL | 0 refills | Status: DC | PRN

## 2024-09-28 NOTE — Telephone Encounter (Unsigned)
 Copied from CRM #[id number]. Topic: Clinical - Medication Refill >> Sep 28, 2024  4:05 PM Roselie C wrote: Medication: HYDROcodone -acetaminophen  (NORCO) 7.5-325 MG tablet  Has the patient contacted their pharmacy? No (Agent: If no, request that the patient contact the pharmacy for the refill. If patient does not wish to contact the pharmacy document the reason why and proceed with request.) (Agent: If yes, when and what did the pharmacy advise?)  This is the patient's preferred pharmacy:  [facility] [id number] - [place], [place] - [address] [place] [address] Phone: [phone] Fax: [phone]  Is this the correct pharmacy for this prescription? Yes If no, delete pharmacy and type the correct one.   Has the prescription been filled recently? Yes  Is the patient out of the medication? No  Has the patient been seen for an appointment in the last year OR does the patient have an upcoming appointment? Yes  Can we respond through MyChart? No  Agent: Please be advised that Rx refills may take up to 3 business days. We ask that you follow-up with your pharmacy.

## 2024-09-28 NOTE — Telephone Encounter (Signed)
 Requesting: hydrocodone  7.5-325mg  Contract:01/15/24 UDS:01/15/24 Last Visit: 09/17/24 Next Visit:03/21/25 Last Refill: 09/01/24 #90 and 0RF   Please Advise

## 2024-10-15 ENCOUNTER — Other Ambulatory Visit: Payer: Self-pay | Admitting: Family Medicine

## 2024-10-15 DIAGNOSIS — M62838 Other muscle spasm: Secondary | ICD-10-CM

## 2024-10-20 ENCOUNTER — Telehealth: Payer: Self-pay | Admitting: Pharmacist

## 2024-10-20 ENCOUNTER — Other Ambulatory Visit

## 2024-10-20 NOTE — Telephone Encounter (Signed)
 Attempt was made to contact patient by phone today for follow up by Clinical Pharmacist regarding medication management / cost.  Unable to reach patient. LM on VM with my contact number [phone].

## 2024-10-28 ENCOUNTER — Other Ambulatory Visit: Payer: Self-pay | Admitting: Family Medicine

## 2024-10-28 MED ORDER — HYDROCODONE-ACETAMINOPHEN 7.5-325 MG PO TABS: 1.0000 | ORAL_TABLET | Freq: Three times a day (TID) | ORAL | 0 refills | Status: AC | PRN

## 2024-10-28 NOTE — Telephone Encounter (Signed)
 Requesting: hydrocodone  7.5-325mg  Contract: 01/15/24 UDS: 01/15/24 Last Visit: 09/17/24 w/ Harlene GRADE Next Visit: 03/21/25 Last Refill: 09/28/24 #90 and 9mq   Please Advise

## 2024-10-28 NOTE — Telephone Encounter (Unsigned)
 Copied from CRM [id number]. Topic: Clinical - Medication Refill >> Oct 28, 2024  2:58 PM Rea ORN wrote: Medication: HYDROcodone -acetaminophen  (NORCO) 7.5-325 MG tablet  Has the patient contacted their pharmacy? No (Agent: If no, request that the patient contact the pharmacy for the refill. If patient does not wish to contact the pharmacy document the reason why and proceed with request.) (Agent: If yes, when and what did the pharmacy advise?)  This is the patient's preferred pharmacy:  [facility] [id number] - [place] - [address] [place] [address] Phone: [phone] Fax: [phone]  Is this the correct pharmacy for this prescription? Yes If no, delete pharmacy and type the correct one.   Has the prescription been filled recently? No  Is the patient out of the medication? No  Has the patient been seen for an appointment in the last year OR does the patient have an upcoming appointment? Yes  Can we respond through MyChart? No  Agent: Please be advised that Rx refills may take up to 3 business days. We ask that you follow-up with your pharmacy.

## 2024-11-02 ENCOUNTER — Telehealth: Payer: Self-pay | Admitting: Family Medicine

## 2024-11-02 NOTE — Telephone Encounter (Signed)
 Copied from CRM [id number]. Topic: General - Other >> Nov 02, 2024  3:13 PM Mesmerise C wrote: Reason for CRM: Patient's daughter [name] trying to reach [name] from a call from 1/7 states has made multiple attempts to reach her number and hasn't heard back, called CAL stated she wasn't available to take the call at the moment and can call back, can be reached at [phone]

## 2024-11-09 ENCOUNTER — Other Ambulatory Visit (HOSPITAL_BASED_OUTPATIENT_CLINIC_OR_DEPARTMENT_OTHER)

## 2024-11-10 NOTE — Telephone Encounter (Unsigned)
 Copied from CRM [id number]. Topic: General - Other >> Nov 02, 2024  3:13 PM Mesmerise C wrote: Reason for CRM: Patient's daughter [name] trying to reach [name] from a call from 1/7 states has made multiple attempts to reach her number and hasn't heard back, called [facility] stated she wasn't available to take the call at the moment and can call back, can be reached at [phone] >> Nov 10, 2024  2:32 PM Robinson H wrote: Patients daughter returning call to Juliette at office, reached out to [facility] and was holding for [name] checked in on daughter and she wants a call back doesn't want to continue to hold.  Melanie Lee [phone] [name] [phone]

## 2024-12-02 ENCOUNTER — Other Ambulatory Visit (HOSPITAL_BASED_OUTPATIENT_CLINIC_OR_DEPARTMENT_OTHER)

## 2025-02-23 ENCOUNTER — Ambulatory Visit

## 2025-03-21 ENCOUNTER — Ambulatory Visit: Admitting: Family Medicine
